# Patient Record
Sex: Female | Born: 1935 | Race: Black or African American | Hispanic: No | Smoking: Never smoker
Health system: Southern US, Community
[De-identification: ages and names within clinical notes are randomized; demographics above are authoritative.]

## PROBLEM LIST (undated history)

## (undated) DIAGNOSIS — K5790 Diverticulosis of intestine, part unspecified, without perforation or abscess without bleeding: Secondary | ICD-10-CM

## (undated) DIAGNOSIS — G4700 Insomnia, unspecified: Secondary | ICD-10-CM

## (undated) DIAGNOSIS — I509 Heart failure, unspecified: Secondary | ICD-10-CM

## (undated) DIAGNOSIS — K635 Polyp of colon: Secondary | ICD-10-CM

## (undated) DIAGNOSIS — I10 Essential (primary) hypertension: Secondary | ICD-10-CM

## (undated) DIAGNOSIS — D490 Neoplasm of unspecified behavior of digestive system: Secondary | ICD-10-CM

## (undated) DIAGNOSIS — I447 Left bundle-branch block, unspecified: Secondary | ICD-10-CM

## (undated) DIAGNOSIS — M1990 Unspecified osteoarthritis, unspecified site: Secondary | ICD-10-CM

## (undated) DIAGNOSIS — E785 Hyperlipidemia, unspecified: Secondary | ICD-10-CM

## (undated) DIAGNOSIS — K648 Other hemorrhoids: Secondary | ICD-10-CM

## (undated) HISTORY — DX: Heart failure, unspecified: I50.9

## (undated) HISTORY — DX: Essential (primary) hypertension: I10

## (undated) HISTORY — DX: Unspecified osteoarthritis, unspecified site: M19.90

## (undated) HISTORY — PX: COLON SURGERY: SHX602

## (undated) HISTORY — PX: ABDOMINAL HYSTERECTOMY: SHX81

## (undated) HISTORY — DX: Polyp of colon: K63.5

---

## 1997-07-04 ENCOUNTER — Ambulatory Visit (HOSPITAL_BASED_OUTPATIENT_CLINIC_OR_DEPARTMENT_OTHER): Admission: RE | Admit: 1997-07-04 | Discharge: 1997-07-04 | Payer: Self-pay | Admitting: Urology

## 1997-07-29 ENCOUNTER — Ambulatory Visit (HOSPITAL_COMMUNITY): Admission: RE | Admit: 1997-07-29 | Discharge: 1997-07-29 | Payer: Self-pay | Admitting: Urology

## 1997-12-15 ENCOUNTER — Ambulatory Visit (HOSPITAL_COMMUNITY): Admission: RE | Admit: 1997-12-15 | Discharge: 1997-12-15 | Payer: Self-pay | Admitting: Gastroenterology

## 1999-02-23 ENCOUNTER — Ambulatory Visit (HOSPITAL_COMMUNITY): Admission: RE | Admit: 1999-02-23 | Discharge: 1999-02-23 | Payer: Self-pay | Admitting: Cardiology

## 1999-02-23 ENCOUNTER — Encounter: Payer: Self-pay | Admitting: Cardiology

## 1999-07-02 ENCOUNTER — Ambulatory Visit (HOSPITAL_COMMUNITY): Admission: RE | Admit: 1999-07-02 | Discharge: 1999-07-02 | Payer: Self-pay | Admitting: Cardiology

## 1999-09-13 ENCOUNTER — Encounter: Admission: RE | Admit: 1999-09-13 | Discharge: 1999-09-13 | Payer: Self-pay | Admitting: Cardiology

## 1999-09-13 ENCOUNTER — Encounter: Payer: Self-pay | Admitting: Cardiology

## 1999-10-31 ENCOUNTER — Other Ambulatory Visit: Admission: RE | Admit: 1999-10-31 | Discharge: 1999-10-31 | Payer: Self-pay | Admitting: Obstetrics and Gynecology

## 2000-02-27 ENCOUNTER — Ambulatory Visit (HOSPITAL_COMMUNITY): Admission: RE | Admit: 2000-02-27 | Discharge: 2000-02-27 | Payer: Self-pay | Admitting: Cardiology

## 2000-02-27 ENCOUNTER — Encounter: Payer: Self-pay | Admitting: Cardiology

## 2000-03-23 ENCOUNTER — Emergency Department (HOSPITAL_COMMUNITY): Admission: EM | Admit: 2000-03-23 | Discharge: 2000-03-23 | Payer: Self-pay

## 2000-07-25 ENCOUNTER — Encounter: Admission: RE | Admit: 2000-07-25 | Discharge: 2000-07-25 | Payer: Self-pay | Admitting: Cardiology

## 2000-07-25 ENCOUNTER — Encounter: Payer: Self-pay | Admitting: Cardiology

## 2000-08-15 ENCOUNTER — Encounter: Admission: RE | Admit: 2000-08-15 | Discharge: 2000-11-13 | Payer: Self-pay | Admitting: Cardiology

## 2000-09-11 ENCOUNTER — Encounter: Admission: RE | Admit: 2000-09-11 | Discharge: 2000-09-11 | Payer: Self-pay | Admitting: Cardiology

## 2000-09-11 ENCOUNTER — Encounter: Payer: Self-pay | Admitting: Cardiology

## 2000-09-19 ENCOUNTER — Emergency Department (HOSPITAL_COMMUNITY): Admission: EM | Admit: 2000-09-19 | Discharge: 2000-09-19 | Payer: Self-pay | Admitting: Emergency Medicine

## 2001-01-02 ENCOUNTER — Other Ambulatory Visit: Admission: RE | Admit: 2001-01-02 | Discharge: 2001-01-02 | Payer: Self-pay | Admitting: Obstetrics and Gynecology

## 2001-02-12 ENCOUNTER — Encounter: Payer: Self-pay | Admitting: Cardiology

## 2001-02-12 ENCOUNTER — Encounter: Admission: RE | Admit: 2001-02-12 | Discharge: 2001-02-12 | Payer: Self-pay | Admitting: Cardiology

## 2001-04-16 ENCOUNTER — Ambulatory Visit (HOSPITAL_COMMUNITY): Admission: RE | Admit: 2001-04-16 | Discharge: 2001-04-16 | Payer: Self-pay | Admitting: Gastroenterology

## 2001-07-14 ENCOUNTER — Ambulatory Visit (HOSPITAL_COMMUNITY): Admission: RE | Admit: 2001-07-14 | Discharge: 2001-07-14 | Payer: Self-pay | Admitting: Cardiology

## 2001-10-28 ENCOUNTER — Other Ambulatory Visit: Admission: RE | Admit: 2001-10-28 | Discharge: 2001-10-28 | Payer: Self-pay | Admitting: Obstetrics and Gynecology

## 2002-04-07 ENCOUNTER — Encounter: Admission: RE | Admit: 2002-04-07 | Discharge: 2002-04-07 | Payer: Self-pay | Admitting: Cardiology

## 2002-04-07 ENCOUNTER — Encounter: Payer: Self-pay | Admitting: Cardiology

## 2002-06-25 ENCOUNTER — Encounter: Payer: Self-pay | Admitting: Cardiology

## 2002-06-25 ENCOUNTER — Encounter: Admission: RE | Admit: 2002-06-25 | Discharge: 2002-06-25 | Payer: Self-pay | Admitting: Cardiology

## 2002-12-17 ENCOUNTER — Ambulatory Visit (HOSPITAL_COMMUNITY): Admission: RE | Admit: 2002-12-17 | Discharge: 2002-12-17 | Payer: Self-pay | Admitting: Cardiology

## 2003-04-25 ENCOUNTER — Encounter: Admission: RE | Admit: 2003-04-25 | Discharge: 2003-04-25 | Payer: Self-pay | Admitting: Cardiology

## 2003-04-29 ENCOUNTER — Other Ambulatory Visit: Admission: RE | Admit: 2003-04-29 | Discharge: 2003-04-29 | Payer: Self-pay | Admitting: Obstetrics and Gynecology

## 2003-07-20 ENCOUNTER — Encounter (INDEPENDENT_AMBULATORY_CARE_PROVIDER_SITE_OTHER): Payer: Self-pay | Admitting: *Deleted

## 2003-07-20 ENCOUNTER — Ambulatory Visit (HOSPITAL_COMMUNITY): Admission: RE | Admit: 2003-07-20 | Discharge: 2003-07-20 | Payer: Self-pay | Admitting: Gastroenterology

## 2004-01-06 ENCOUNTER — Encounter: Admission: RE | Admit: 2004-01-06 | Discharge: 2004-01-06 | Payer: Self-pay | Admitting: Cardiology

## 2004-04-02 ENCOUNTER — Encounter: Admission: RE | Admit: 2004-04-02 | Discharge: 2004-04-02 | Payer: Self-pay | Admitting: Cardiology

## 2004-07-25 ENCOUNTER — Ambulatory Visit (HOSPITAL_COMMUNITY): Admission: RE | Admit: 2004-07-25 | Discharge: 2004-07-25 | Payer: Self-pay | Admitting: Cardiology

## 2004-08-06 ENCOUNTER — Encounter: Admission: RE | Admit: 2004-08-06 | Discharge: 2004-08-06 | Payer: Self-pay | Admitting: Obstetrics and Gynecology

## 2004-10-02 ENCOUNTER — Ambulatory Visit (HOSPITAL_COMMUNITY): Admission: RE | Admit: 2004-10-02 | Discharge: 2004-10-02 | Payer: Self-pay | Admitting: Cardiology

## 2004-10-29 ENCOUNTER — Encounter: Admission: RE | Admit: 2004-10-29 | Discharge: 2004-10-29 | Payer: Self-pay | Admitting: Cardiology

## 2004-11-06 ENCOUNTER — Encounter: Admission: RE | Admit: 2004-11-06 | Discharge: 2004-11-06 | Payer: Self-pay | Admitting: Cardiology

## 2004-11-09 ENCOUNTER — Ambulatory Visit (HOSPITAL_COMMUNITY): Admission: RE | Admit: 2004-11-09 | Discharge: 2004-11-09 | Payer: Self-pay | Admitting: Pulmonary Disease

## 2004-11-09 ENCOUNTER — Encounter (INDEPENDENT_AMBULATORY_CARE_PROVIDER_SITE_OTHER): Payer: Self-pay | Admitting: Specialist

## 2004-11-22 ENCOUNTER — Ambulatory Visit (HOSPITAL_COMMUNITY): Admission: RE | Admit: 2004-11-22 | Discharge: 2004-11-22 | Payer: Self-pay | Admitting: Cardiology

## 2005-07-03 ENCOUNTER — Ambulatory Visit (HOSPITAL_COMMUNITY): Admission: RE | Admit: 2005-07-03 | Discharge: 2005-07-03 | Payer: Self-pay | Admitting: Cardiology

## 2005-12-18 ENCOUNTER — Encounter: Admission: RE | Admit: 2005-12-18 | Discharge: 2005-12-18 | Payer: Self-pay | Admitting: Cardiology

## 2006-03-19 ENCOUNTER — Encounter (HOSPITAL_COMMUNITY): Admission: RE | Admit: 2006-03-19 | Discharge: 2006-03-19 | Payer: Self-pay | Admitting: Cardiology

## 2006-08-19 ENCOUNTER — Encounter: Admission: RE | Admit: 2006-08-19 | Discharge: 2006-08-19 | Payer: Self-pay | Admitting: Cardiology

## 2007-06-02 ENCOUNTER — Encounter: Admission: RE | Admit: 2007-06-02 | Discharge: 2007-06-02 | Payer: Self-pay | Admitting: Cardiology

## 2008-05-06 ENCOUNTER — Encounter: Admission: RE | Admit: 2008-05-06 | Discharge: 2008-05-06 | Payer: Self-pay | Admitting: Orthopedic Surgery

## 2008-12-14 ENCOUNTER — Encounter: Admission: RE | Admit: 2008-12-14 | Discharge: 2008-12-14 | Payer: Self-pay | Admitting: Cardiology

## 2008-12-26 ENCOUNTER — Encounter (HOSPITAL_COMMUNITY): Admission: RE | Admit: 2008-12-26 | Discharge: 2009-01-06 | Payer: Self-pay | Admitting: Family Medicine

## 2009-01-04 ENCOUNTER — Encounter: Admission: RE | Admit: 2009-01-04 | Discharge: 2009-01-04 | Payer: Self-pay | Admitting: Obstetrics and Gynecology

## 2009-07-24 ENCOUNTER — Encounter: Admission: RE | Admit: 2009-07-24 | Discharge: 2009-07-24 | Payer: Self-pay | Admitting: Cardiology

## 2009-10-25 ENCOUNTER — Encounter
Admission: RE | Admit: 2009-10-25 | Discharge: 2010-01-02 | Payer: Self-pay | Source: Home / Self Care | Attending: Physical Medicine & Rehabilitation | Admitting: Physical Medicine & Rehabilitation

## 2009-10-31 ENCOUNTER — Ambulatory Visit: Payer: Self-pay | Admitting: Physical Medicine & Rehabilitation

## 2010-01-08 ENCOUNTER — Encounter
Admission: RE | Admit: 2010-01-08 | Discharge: 2010-02-06 | Payer: Self-pay | Source: Home / Self Care | Attending: Physical Medicine & Rehabilitation | Admitting: Physical Medicine & Rehabilitation

## 2010-01-09 ENCOUNTER — Ambulatory Visit: Admit: 2010-01-09 | Payer: Self-pay | Admitting: Physical Medicine & Rehabilitation

## 2010-01-27 ENCOUNTER — Encounter: Payer: Self-pay | Admitting: Cardiology

## 2010-02-06 ENCOUNTER — Ambulatory Visit
Admission: RE | Admit: 2010-02-06 | Discharge: 2010-02-06 | Payer: Self-pay | Source: Home / Self Care | Attending: Physical Medicine & Rehabilitation | Admitting: Physical Medicine & Rehabilitation

## 2010-02-21 ENCOUNTER — Ambulatory Visit (HOSPITAL_COMMUNITY)
Admission: RE | Admit: 2010-02-21 | Discharge: 2010-02-21 | Disposition: A | Discharge status: Home / Self Care | Payer: Medicare Other | Source: Ambulatory Visit | Attending: Gastroenterology | Admitting: Gastroenterology

## 2010-02-21 ENCOUNTER — Other Ambulatory Visit: Payer: Self-pay | Admitting: Gastroenterology

## 2010-02-21 DIAGNOSIS — Z860100 Personal history of colon polyps, unspecified: Secondary | ICD-10-CM | POA: Insufficient documentation

## 2010-02-21 DIAGNOSIS — D371 Neoplasm of uncertain behavior of stomach: Secondary | ICD-10-CM | POA: Insufficient documentation

## 2010-02-21 DIAGNOSIS — K5730 Diverticulosis of large intestine without perforation or abscess without bleeding: Secondary | ICD-10-CM | POA: Insufficient documentation

## 2010-02-21 DIAGNOSIS — K639 Disease of intestine, unspecified: Secondary | ICD-10-CM | POA: Insufficient documentation

## 2010-02-21 DIAGNOSIS — Z800 Family history of malignant neoplasm of digestive organs: Secondary | ICD-10-CM | POA: Insufficient documentation

## 2010-02-21 DIAGNOSIS — K648 Other hemorrhoids: Secondary | ICD-10-CM | POA: Insufficient documentation

## 2010-03-01 ENCOUNTER — Other Ambulatory Visit (HOSPITAL_COMMUNITY): Payer: Self-pay | Admitting: Cardiology

## 2010-03-01 DIAGNOSIS — I509 Heart failure, unspecified: Secondary | ICD-10-CM

## 2010-03-21 ENCOUNTER — Ambulatory Visit (HOSPITAL_COMMUNITY)
Admission: RE | Admit: 2010-03-21 | Discharge: 2010-03-21 | Disposition: A | Discharge status: Home / Self Care | Payer: Medicare Other | Source: Ambulatory Visit | Attending: Cardiology | Admitting: Cardiology

## 2010-03-21 DIAGNOSIS — R079 Chest pain, unspecified: Secondary | ICD-10-CM | POA: Insufficient documentation

## 2010-03-21 DIAGNOSIS — I4949 Other premature depolarization: Secondary | ICD-10-CM | POA: Insufficient documentation

## 2010-03-21 DIAGNOSIS — I509 Heart failure, unspecified: Secondary | ICD-10-CM | POA: Insufficient documentation

## 2010-03-21 DIAGNOSIS — I498 Other specified cardiac arrhythmias: Secondary | ICD-10-CM | POA: Insufficient documentation

## 2010-03-21 MED ORDER — TECHNETIUM TC 99M TETROFOSMIN IV KIT
10.0000 | PACK | Freq: Once | INTRAVENOUS | Status: AC | PRN
  Administered 2010-03-21: 10 via INTRAVENOUS

## 2010-03-21 MED ORDER — TECHNETIUM TC 99M TETROFOSMIN IV KIT
30.0000 | PACK | Freq: Once | INTRAVENOUS | Status: AC | PRN
  Administered 2010-03-21: 30 via INTRAVENOUS

## 2010-05-01 ENCOUNTER — Ambulatory Visit: Payer: Self-pay | Admitting: Physical Medicine & Rehabilitation

## 2010-05-04 ENCOUNTER — Ambulatory Visit: Payer: Self-pay | Admitting: Physical Medicine & Rehabilitation

## 2010-05-04 ENCOUNTER — Encounter: Payer: Medicare Other | Attending: Neurosurgery | Admitting: Neurosurgery

## 2010-05-04 DIAGNOSIS — M549 Dorsalgia, unspecified: Secondary | ICD-10-CM | POA: Insufficient documentation

## 2010-05-04 DIAGNOSIS — M5430 Sciatica, unspecified side: Secondary | ICD-10-CM

## 2010-05-04 DIAGNOSIS — M4120 Other idiopathic scoliosis, site unspecified: Secondary | ICD-10-CM | POA: Insufficient documentation

## 2010-05-04 DIAGNOSIS — IMO0002 Reserved for concepts with insufficient information to code with codable children: Secondary | ICD-10-CM | POA: Insufficient documentation

## 2010-05-04 DIAGNOSIS — M4000 Postural kyphosis, site unspecified: Secondary | ICD-10-CM | POA: Insufficient documentation

## 2010-05-04 DIAGNOSIS — M069 Rheumatoid arthritis, unspecified: Secondary | ICD-10-CM | POA: Insufficient documentation

## 2010-05-04 DIAGNOSIS — M25569 Pain in unspecified knee: Secondary | ICD-10-CM | POA: Insufficient documentation

## 2010-05-05 NOTE — Assessment & Plan Note (Signed)
Mariah Aguilar is a patient who has been followed by Dr. Claudette Laws for sometime for back and knee pain.  This is an 77 year old female with kyphosis, degenerative disk disease, scoliosis as well as rheumatoid arthritis.  She is here with no new complaints.  She tells me that Dr. Donia Guiles, her primary care doctor is going out on back disability for a while to get treated and she will be changing primary care doctors who will hopefully fill Valium for her, she brought that prescription today from Dr. Shana Chute asking if we could write that and I have referred her back to her new primary care.  Again functionality, her average pain is about 6.  Her general activity level is about an 8.  Pain is worse at night.  Walking sometimes aggravates.  Heat and ice and medications tend to help.  She walks without assistance.  She drives.  She is able to climb steps.  She is not employed.  REVIEW OF SYSTEMS:  Notable for those difficulties as described in the history of present illness and past medical history.  Review of systems is otherwise unremarkable.  PAST MEDICAL HISTORY:  Significant for GI problems, blood pressure, and arthritis.  SOCIAL HISTORY:  She is widowed, lives alone.  FAMILY HISTORY:  Unchanged.  PHYSICAL EXAMINATION:  She is point tender over her mid and low back. She does have a brace on.  Her blood pressure is 131/76, pulse 85, respirations 18, and O2 sats 97 on room air.  Her sensation is positive and equal in lower extremities.  Motor strength appears to be 5/5 in the lower extremities, tested.  She gets weakness of her leg due to age and her lower extremities have no edema today.  Constitutionally, she is within normal limits.  She is alert and oriented x3.  No other problems noted.  Questions encouraged and answered.  She is okay on her medications and no prescriptions were issued.  Her counts were correct and there were no signs of aberrant behavior.  We  will see her back in a month.     Shirell Struthers L. Blima Dessert    RLW/MedQ D:  05/04/2010 14:16:50  T:  05/05/2010 03:35:07  Job #:  [id number]

## 2010-05-14 ENCOUNTER — Ambulatory Visit (HOSPITAL_COMMUNITY)
Admission: RE | Admit: 2010-05-14 | Discharge: 2010-05-14 | Disposition: A | Discharge status: Home / Self Care | Payer: Medicare Other | Source: Ambulatory Visit | Attending: General Surgery | Admitting: General Surgery

## 2010-05-14 ENCOUNTER — Encounter (HOSPITAL_COMMUNITY)
Admission: RE | Admit: 2010-05-14 | Discharge: 2010-05-14 | Disposition: A | Discharge status: Home / Self Care | Payer: Medicare Other | Source: Ambulatory Visit | Attending: General Surgery | Admitting: General Surgery

## 2010-05-14 ENCOUNTER — Other Ambulatory Visit (HOSPITAL_COMMUNITY): Payer: Self-pay | Admitting: General Surgery

## 2010-05-14 DIAGNOSIS — J4489 Other specified chronic obstructive pulmonary disease: Secondary | ICD-10-CM | POA: Insufficient documentation

## 2010-05-14 DIAGNOSIS — J449 Chronic obstructive pulmonary disease, unspecified: Secondary | ICD-10-CM | POA: Insufficient documentation

## 2010-05-14 DIAGNOSIS — Z01818 Encounter for other preprocedural examination: Secondary | ICD-10-CM | POA: Insufficient documentation

## 2010-05-14 DIAGNOSIS — K635 Polyp of colon: Secondary | ICD-10-CM

## 2010-05-14 DIAGNOSIS — I517 Cardiomegaly: Secondary | ICD-10-CM | POA: Insufficient documentation

## 2010-05-14 DIAGNOSIS — Z01810 Encounter for preprocedural cardiovascular examination: Secondary | ICD-10-CM | POA: Insufficient documentation

## 2010-05-14 DIAGNOSIS — Z01812 Encounter for preprocedural laboratory examination: Secondary | ICD-10-CM | POA: Insufficient documentation

## 2010-05-14 LAB — DIFFERENTIAL
Basophils Relative: 1 % (ref 0–1)
Eosinophils Absolute: 0.3 10*3/uL (ref 0.0–0.7)
Lymphs Abs: 2.3 10*3/uL (ref 0.7–4.0)
Monocytes Absolute: 0.4 10*3/uL (ref 0.1–1.0)
Monocytes Relative: 7 % (ref 3–12)
Neutrophils Relative %: 48 % (ref 43–77)

## 2010-05-14 LAB — BASIC METABOLIC PANEL WITH GFR
BUN: 13 mg/dL (ref 6–23)
CO2: 35 meq/L — ABNORMAL HIGH (ref 19–32)
Calcium: 9.2 mg/dL (ref 8.4–10.5)
Chloride: 99 meq/L (ref 96–112)
Creatinine, Ser: 0.7 mg/dL (ref 0.4–1.2)
GFR calc non Af Amer: >60 mL/min
Glucose, Bld: 127 mg/dL — ABNORMAL HIGH (ref 70–99)
Potassium: 4 meq/L (ref 3.5–5.1)
Sodium: 140 meq/L (ref 135–145)

## 2010-05-14 LAB — CBC
Hemoglobin: 13.6 g/dL (ref 12.0–15.0)
MCH: 28.5 pg (ref 26.0–34.0)
MCHC: 33.3 g/dL (ref 30.0–36.0)
MCV: 85.5 fL (ref 78.0–100.0)
Platelets: 251 10*3/uL (ref 150–400)
RBC: 4.77 MIL/uL (ref 3.87–5.11)

## 2010-05-14 LAB — SURGICAL PCR SCREEN
MRSA, PCR: NEGATIVE
Staphylococcus aureus: NEGATIVE

## 2010-05-21 ENCOUNTER — Other Ambulatory Visit: Payer: Self-pay | Admitting: General Surgery

## 2010-05-21 ENCOUNTER — Inpatient Hospital Stay (HOSPITAL_COMMUNITY)
Admission: RE | Admit: 2010-05-21 | Discharge: 2010-05-25 | DRG: 330 | Disposition: A | Discharge status: Home / Self Care | Payer: Medicare Other | Source: Ambulatory Visit | Attending: General Surgery | Admitting: General Surgery

## 2010-05-21 DIAGNOSIS — I43 Cardiomyopathy in diseases classified elsewhere: Secondary | ICD-10-CM | POA: Diagnosis present

## 2010-05-21 DIAGNOSIS — Z7982 Long term (current) use of aspirin: Secondary | ICD-10-CM

## 2010-05-21 DIAGNOSIS — Z79899 Other long term (current) drug therapy: Secondary | ICD-10-CM

## 2010-05-21 DIAGNOSIS — B9789 Other viral agents as the cause of diseases classified elsewhere: Secondary | ICD-10-CM | POA: Diagnosis present

## 2010-05-21 DIAGNOSIS — I509 Heart failure, unspecified: Secondary | ICD-10-CM | POA: Diagnosis present

## 2010-05-21 DIAGNOSIS — E876 Hypokalemia: Secondary | ICD-10-CM | POA: Diagnosis not present

## 2010-05-21 DIAGNOSIS — D126 Benign neoplasm of colon, unspecified: Principal | ICD-10-CM | POA: Diagnosis present

## 2010-05-21 LAB — TYPE AND SCREEN: Antibody Screen: NEGATIVE

## 2010-05-21 LAB — ABO/RH: ABO/RH(D): O POS

## 2010-05-22 LAB — BASIC METABOLIC PANEL
CO2: 29 mEq/L (ref 19–32)
Calcium: 7.7 mg/dL — ABNORMAL LOW (ref 8.4–10.5)
Chloride: 100 mEq/L (ref 96–112)
GFR calc Af Amer: >60 mL/min (ref >60)
Potassium: 3.3 mEq/L — ABNORMAL LOW (ref 3.5–5.1)
Sodium: 136 mEq/L (ref 135–145)

## 2010-05-22 LAB — CBC
HCT: 35 % — ABNORMAL LOW (ref 36.0–46.0)
Hemoglobin: 11.6 g/dL — ABNORMAL LOW (ref 12.0–15.0)
MCHC: 33.1 g/dL (ref 30.0–36.0)
RBC: 4.17 MIL/uL (ref 3.87–5.11)
WBC: 10.2 10*3/uL (ref 4.0–10.5)

## 2010-05-22 LAB — DIFFERENTIAL
Basophils Absolute: 0 10*3/uL (ref 0.0–0.1)
Basophils Relative: 0 % (ref 0–1)
Lymphocytes Relative: 19 % (ref 12–46)
Neutro Abs: 6.8 10*3/uL (ref 1.7–7.7)
Neutrophils Relative %: 67 % (ref 43–77)

## 2010-05-23 LAB — BASIC METABOLIC PANEL WITH GFR
BUN: 8 mg/dL (ref 6–23)
CO2: 31 meq/L (ref 19–32)
Calcium: 8.4 mg/dL (ref 8.4–10.5)
Chloride: 100 meq/L (ref 96–112)
Creatinine, Ser: 0.62 mg/dL (ref 0.4–1.2)
GFR calc non Af Amer: >60 mL/min
Glucose, Bld: 146 mg/dL — ABNORMAL HIGH (ref 70–99)
Potassium: 3.6 meq/L (ref 3.5–5.1)
Sodium: 137 meq/L (ref 135–145)

## 2010-05-25 NOTE — Op Note (Signed)
NAME:  Mariah Aguilar, Mariah Aguilar            ACCOUNT NO.:  [id number]   MEDICAL RECORD NO.:  [id number]          PATIENT TYPE:  AMB   LOCATION:  ENDO                         FACILITY:  [facility]   PHYSICIAN:  Anselmo Rod, M.D.  DATE OF BIRTH:  Jun 29, 1922   DATE OF PROCEDURE:  11/09/2004  DATE OF DISCHARGE:                                 OPERATIVE REPORT   PROCEDURE PERFORMED:  Colonoscopy with cold biopsies times two.   ENDOSCOPIST:  Charna Elizabeth, M.D.   INSTRUMENT USED:  Olympus video colonoscope.   INDICATIONS FOR PROCEDURE:  The patient is an 77 year old African-American  female with a family history of colon cancer in her mother and a personal  history of adenomatous polyps undergoing a repeat colonoscopy to rule out  recurrent polyps, masses, etc.   PREPROCEDURE PREPARATION:  Informed consent was procured from the patient.  The patient was fasted for four hours prior to the procedure and prepped  OsmoPrep pills the night prior to and the morning of the procedure.  The  risks and benefits of the procedure were discussed with the patient in great  detail. A 10% miss rate for polyps or cancers was discussed with the patient  as well.   PREPROCEDURE PHYSICAL:  The patient had stable vital signs.  Neck supple.  Chest clear to auscultation.  S1 and S2 regular.  Abdomen soft with normal  bowel sounds.   DESCRIPTION OF PROCEDURE:  The patient was placed in left lateral decubitus  position and sedated with 70 mg of Demerol and 7.5 mg of Versed in slow  incremental doses.  Once the patient was adequately sedated and maintained  on low flow oxygen and continuous cardiac monitoring, the Olympus video  colonoscope was advanced from the rectum to the cecum with slight difficulty  because of the extensive diverticulosis in the sigmoid colon.  The scope was  gently advanced from the rectosigmoid colon to the transverse colon.  The  appendicular orifice and ileocecal valve were clearly visualized  and  photographed. There was some residual stool in the colon and multiple washes  were done. There was extensive sigmoid diverticuloses noted, stool in  several of the diverticula.  Small internal hemorrhoids were seen on  retroflexion.  A small sessile polyp was biopsied (cold biopsied times one)  from the cecal base.  Another small sessile polyp was biopsied from the  distal right colon (cold biopsy times one).  No large masses or polyps were  seen.  The patient tolerated the procedure well without immediate  complication.   IMPRESSION:  1.  Small nonbleeding internal hemorrhoids.  2.  Extensive sigmoid diverticulosis.  3.  Two small sessile polyps biopsied (cold biopsies) from cecum and distal      right colon.  4.  External compression of the rectum from a pessary that the patient is      known to have.   RECOMMENDATIONS:  1.  Await pathology results.  2.  Repeat colonoscopy depending on pathology results.  3.  Continue high fiber diet with liberal fluid intake.  4.  Avoid all nonsteroidals including aspirin for the  next two weeks.  5.  Outpatient followup as need arises in the future.      Anselmo Rod, M.D.  Electronically Signed     JNM/MEDQ  D:  11/09/2004  T:  11/09/2004  Job:  [id number]   cc:   Osvaldo Shipper. Spruill, M.D.  Fax: [phone]   Maxie Better, M.D.  Fax: [phone]

## 2010-05-25 NOTE — Op Note (Signed)
NAME:  Mariah Aguilar, Mariah Aguilar                      ACCOUNT NO.:  [id number]   MEDICAL RECORD NO.:  [id number]                   PATIENT TYPE:  AMB   LOCATION:  ENDO                                 FACILITY:  [facility]   PHYSICIAN:  Anselmo Rod, M.D.               DATE OF BIRTH:  15-Dec-1922   DATE OF PROCEDURE:  07/20/2003  DATE OF DISCHARGE:                                 OPERATIVE REPORT   PROCEDURE PERFORMED:  Colonoscopy with biopsies times one.   ENDOSCOPIST:  Charna Elizabeth, M.D.   INSTRUMENT USED:  Olympus video colonoscope.   INDICATIONS FOR PROCEDURE:  The patient is a 77 year old African-American  female with a personal history of colonic polyps and a family history of  colon cancer undergoing repeat colonoscopy.  Rule out colonic polyps,  masses, etc.   PREPROCEDURE PREPARATION:  Informed consent was procured from the patient.  The patient was fasted for eight hours prior to the procedure and prepped  with a bottle of magnesium citrate and a gallon of GoLYTELY the night prior  to the procedure.   PREPROCEDURE PHYSICAL:  The patient had stable vital signs.  Neck supple.  Chest clear to auscultation.  S1 and S2 regular.  Abdomen soft with normal  bowel sounds.   DESCRIPTION OF PROCEDURE:  The patient was placed in left lateral decubitus  position and sedated with 80 mg of Demerol and 10 mg of Versed in slow  incremental doses.  Once the patient was adequately sedated and maintained  on low flow oxygen and continuous cardiac monitoring, the Olympus video  colonoscope was advanced from the rectum to the cecum with extreme  difficulty.  There was a large amount of solid stool throughout the colon,  multiple washes were done.  The appendicular orifice and ileocecal valve  were visualized and photographed; however, visualization was limited.  A  small sessile polyp was biopsied from the proximal right colon.  Retroflexion in the rectum revealed no abnormalities.  Large  diverticula  were seen throughout the colon with inspissated stool in several of the  diverticula.  Small lesions could be missed.   IMPRESSION:  1. Small sessile polyp biopsied from proximal right colon.  2. Pandiverticulosis with large amount of residual stool in the colon.     Inadequate visualization.   RECOMMENDATIONS:  1. Await pathology results.  2. Repeat colorectal cancer screening depending on pathology results.  3. Outpatient followup in the next two weeks for further recommendations.                                               Anselmo Rod, M.D.    JNM/MEDQ  D:  07/20/2003  T:  07/20/2003  Job:  [id number]   cc:   Maxie Better, M.D.  [address]  [place]  [place] [address]  Fax: [phone]   Osvaldo Shipper. Spruill, M.D.  [address] [address]  [place]  [place] [address]  Fax: [phone]

## 2010-05-25 NOTE — Procedures (Signed)
[facility]  Patient:    Mariah Aguilar, Mariah Aguilar Visit Number: [id number] MRN: [id number]          Service Type: END Location: ENDO Attending Physician:  Charna Elizabeth Dictated by:   Anselmo Rod, M.D. Proc. Date: 04/16/01 Admit Date:  04/16/2001   CC:         Osvaldo Shipper. Spruill, M.D.   Procedure Report  DATE OF BIRTH:  1922/01/17  REFERRING PHYSICIAN:  Osvaldo Shipper. Spruill, M.D.  PROCEDURE PERFORMED:  Colonoscopy.  ENDOSCOPIST:  Anselmo Rod, M.D.  INSTRUMENT USED:  Olympus video pediatric colonoscope.  INDICATIONS FOR PROCEDURE:  The patient is a 77 year old African-American female with a history of colon cancer in her mother and a personal history of adenomatous polyps removed in 1999, rule out recurrent polyps.  PREPROCEDURE PREPARATION:  Informed consent was procured from the patient. The patient was fasted for eight hours prior to the procedure and prepped with a bottle of magnesium citrate and a gallon of NuLytely the night prior to the procedure.  PREPROCEDURE PHYSICAL:  The patient had stable vital signs.  Neck supple. Chest clear to auscultation.  S1, S2 regular.  Abdomen soft with normal bowel sounds.  DESCRIPTION OF PROCEDURE:  The patient was placed in the left lateral decubitus position and sedated with 100 mg of Demerol and 7.5 mg of Versed intravenously.  Once the patient was adequately sedated and maintained on low-flow oxygen and continuous cardiac monitoring, the Olympus video colonoscope was advanced from the rectum to the cecum with difficulty.  There was a large amount of solid residual stool in the colon.  Multiple washes were done.  The patients position was changed from the left lateral to the supine and to the right lateral position to adequately visualize the cecal base.  The appendicular orifice and ileocecal valve were clearly visualized and photographed.  No masses, polyps, erosions or ulcerations were  seen.  There was no evidence of hemorrhoids.  The patient had scattered pandiverticular disease.  IMPRESSION: 1. Scattered pandiverticulosis. 2. No masses or polyps seen. 3. Large amount of residual stool in the colon.  Small lesions could have been    missed.  RECOMMENDATIONS: 1. A high fiber diet has been emphasized to the patient.  Brochures have    been given to her for education. 2. Repeat colorectal cancer screening is recommended in the next three years    unless the patient were to develop any abnormal symptoms in the interim.    The colonoscopy is being scheduled at an earlier date at three years    instead of five years because of an inadequate prep and incomplete    visualization of the colon at this time. 3. If the patient develops any abnormal GI symptoms, she is to come back    to the office immediately. Dictated by:   Anselmo Rod, M.D. Attending Physician:  Charna Elizabeth DD:  04/16/01 TD:  04/16/01 Job: [id number]

## 2010-06-01 ENCOUNTER — Encounter: Payer: Medicare Other | Attending: Neurosurgery | Admitting: Neurosurgery

## 2010-06-01 DIAGNOSIS — G894 Chronic pain syndrome: Secondary | ICD-10-CM

## 2010-06-01 DIAGNOSIS — Z9889 Other specified postprocedural states: Secondary | ICD-10-CM | POA: Insufficient documentation

## 2010-06-01 DIAGNOSIS — M4120 Other idiopathic scoliosis, site unspecified: Secondary | ICD-10-CM | POA: Insufficient documentation

## 2010-06-01 DIAGNOSIS — G8929 Other chronic pain: Secondary | ICD-10-CM | POA: Insufficient documentation

## 2010-06-01 DIAGNOSIS — M1710 Unilateral primary osteoarthritis, unspecified knee: Secondary | ICD-10-CM | POA: Insufficient documentation

## 2010-06-01 DIAGNOSIS — M545 Low back pain, unspecified: Secondary | ICD-10-CM | POA: Insufficient documentation

## 2010-06-01 DIAGNOSIS — M5430 Sciatica, unspecified side: Secondary | ICD-10-CM

## 2010-06-02 NOTE — Assessment & Plan Note (Signed)
ACCOUNT:  [id number].  Ms. Birchler followed up here for chronic back pain with Dr. Wynn Banker. She reports no acute changes in her pain.  She rates her pain at about an 8 or 9 on average, stabbing type pain.  Only she does walk independently with somewhat of a kyphotic posture.  However, she has just had colorectal surgery and is recovering from that.  She rates her pain to be worse in the evening and night.  Most activities aggravate her pain.  Medication helps some.  She walks without assistance for the most part.  She does try to exercise every day and walk at least twice a day.  She is retired.  REVIEW OF SYSTEMS:  Notable for those difficulties as described above as well as some constipation, abdominal pain, and healing from her surgery.  PAST MEDICAL HISTORY:  Otherwise unchanged.  SOCIAL HISTORY:  She is widowed.  FAMILY HISTORY:  Significant for heart disease and hypertension.  PHYSICAL EXAM:  Blood pressure 163/84, pulse 71, respirations 20, O2 sats 98 on room air.  Motor strength is 5/5 in the lower extremities as tested, it begins resistance.  Her sensation is positive and equal in the lower extremities.  Her gait is somewhat altered by a slight kyphotic posture, but otherwise normal.  Constitutionally, she is within normal limits.  Orientation, she is alert and orient x3.  Her affects is bright.  ASSESSMENT: 1. The patient with a history of low back pain. 2. She has had a history of scoliosis. 3. Bilateral knee osteoarthritis. 4. Recent colorectal surgery.  PLAN:  She will continue with her medicines as she has them.  She uses Voltaren gel occasionally.  She does not take her Neurontin every day, but she does sometimes due to the feeling it gives her.  She has hydrocodone 7.5/325 one p.o. up to four times a day if she needs it. She denies the need for refills today.  Her questions were encouraged and answered.  We will see her back in 3 months.     Nellene Courtois L.  Blima Dessert Electronically Signed    RLW/MedQ D:  06/01/2010 12:37:13  T:  06/02/2010 02:24:49  Job #:  [id number]

## 2010-06-06 NOTE — Op Note (Signed)
Mariah Aguilar, Mariah Aguilar            ACCOUNT NO.:  [id number]  MEDICAL RECORD NO.:  [id number]           PATIENT TYPE:  I  LOCATION:  5128                         FACILITY:  [facility]  PHYSICIAN:  Cherylynn Ridges, M.D.    DATE OF BIRTH:  11/03/1922  DATE OF PROCEDURE:  05/21/2010 DATE OF DISCHARGE:                              OPERATIVE REPORT   PREOPERATIVE DIAGNOSIS:  Sessile polyp of the cecum.  POSTOPERATIVE DIAGNOSIS:  Sessile polyp of the cecum.  PROCEDURE:  Right partial colectomy.  SURGEON:  Marta Lamas. Lindie Spruce, MD  ASSISTANT:  Anselm Pancoast. Weatherly, MD  ANESTHESIA:  General endotracheal.  ESTIMATED BLOOD LOSS:  Less than 50 mL.  No complications.  CONDITION:  Stable.  FINDINGS:  Sessile polyp of the cecum.  INDICATIONS FOR OPERATION:  The patient is an 77 year old with a sessile polyp of her right colon who comes in now for elective right colectomy.  OPERATION:  The patient was taken to the operating room, placed on the table in supine position.  After an adequate general endotracheal anesthetic was administered, she was prepped and draped in usual sterile manner exposing her entire abdomen.  After proper time-out was performed, identifying the patient, the side of the procedure, and the procedure to be performed, we made a right transverse incision at the level of the umbilicus down into the subcutaneous tissue.  We used electrocautery to go through the subcu, the anterior rectus sheath,  the rectus muscle, and then the posterior rectus sheath while we tented up on it to prevent any bowel injury.  We subsequently opened the posterior fascia transversely for the full extent of the skin incision.  Using primarily a Richardson retractor in place, we were able to mobilize the right colon and terminal ileum.  We could palpate what felt to be a polyp in the cecum.  We came across the distal terminal ileum using a GIA-75 stapler.  Once we did so, we mobilized the right colon  up to just proximal of the hepatic flexure and came across the right colon using a GIA-75 stapler.  We then did a side- to-side functional end-to-end anastomosis between the small bowel and the distal right colon using a GIA-75 stapler.  The resulting enterotomy was closed using a TA-60 stapler.  The intervening mesentery between the resected specimen ends was taken EnSeal super jaw device.  The ileocolic vessel was ligated with double ligature of 2-0 silk.  Once the specimen was removed, the anastomosis was completed, the mesentery was reapproximated using interrupted 2-0 silk sutures.  We changed our gloves twice during the removal of the specimen and then subsequently just after the anastomosis prior to irrigation.  We irrigated with about 500 mL of saline, then we closed in two layers of the fascia.  The posterior and anterior rectus sheath were closed using running looped 0 PDS suture.  The skin was closed using running subcuticular stitch of 3- 0 Monocryl.  Dermabond, Steri-Strips, Tegaderm were used to complete the dressing.  All counts were correct.  We did inject 10 mL of 0.5% Marcaine with epi into the wound.  All counts were correct.  Cherylynn Ridges, M.D.     JOW/MEDQ  D:  05/21/2010  T:  05/21/2010  Job:  [id number]  cc:   Merlene Laughter. Renae Gloss, M.D. Anselmo Rod, MD, [facility]  Electronically Signed by Jimmye Norman M.D. on 06/06/2010 05:10:38 PM

## 2010-06-06 NOTE — Discharge Summary (Signed)
  [name], Mariah            ACCOUNT NO.:  [id number]  MEDICAL RECORD NO.:  [id number]           PATIENT TYPE:  I  LOCATION:  5128                         FACILITY:  [facility]  PHYSICIAN:  Cherylynn Ridges, M.D.    DATE OF BIRTH:  1922/01/11  DATE OF ADMISSION:  05/21/2010 DATE OF DISCHARGE:  05/25/2010                              DISCHARGE SUMMARY   DISCHARGE DIAGNOSIS:  Tubular adenoma of the cecum and right colon.  PRINCIPAL PROCEDURE:  Right colectomy.  ADDITIONAL DIAGNOSIS:  History of congestive heart failure.  DISCHARGE MEDICATIONS:  She will be given Percocet to take at home for pain.  DIET ON DISCHARGE:  Regular.  CONDITION:  Stable.  BRIEF SUMMARY OF HOSPITAL COURSE:  The patient was admitted on the day of surgery after bowel prep for a sessile polyp of the cecum. Subsequent pathology of the resected terminal ileum and right colon demonstrates a tubular adenoma.  There is no evidence of malignancy, no high-grade dysplasia.  She was started on clear liquids on postop day #1 and #2, advanced to fluid liquids on day #3 and a full soft diet on the evening of postop day #3 and #4.  She has had several bowel movements, passing gas, her wound looks great with no evidence of infection.  She has no abdominal pain.  She is exercising in the hallway, walking very well.  She is using incentive spirometer up to 1502 liters.  She is return to clinic a week from Tuesday which will be Jun 05, 2010.     Cherylynn Ridges, M.D.     JOW/MEDQ  D:  05/25/2010  T:  05/25/2010  Job:  [id number]  Electronically Signed by Jimmye Norman M.D. on 06/06/2010 05:10:43 PM

## 2010-06-07 ENCOUNTER — Encounter (INDEPENDENT_AMBULATORY_CARE_PROVIDER_SITE_OTHER): Payer: Self-pay | Admitting: General Surgery

## 2010-07-18 ENCOUNTER — Encounter: Payer: Medicare Other | Attending: Neurosurgery | Admitting: Neurosurgery

## 2010-07-18 DIAGNOSIS — M4000 Postural kyphosis, site unspecified: Secondary | ICD-10-CM | POA: Insufficient documentation

## 2010-07-18 DIAGNOSIS — M4120 Other idiopathic scoliosis, site unspecified: Secondary | ICD-10-CM | POA: Insufficient documentation

## 2010-07-18 DIAGNOSIS — IMO0002 Reserved for concepts with insufficient information to code with codable children: Secondary | ICD-10-CM | POA: Insufficient documentation

## 2010-07-18 DIAGNOSIS — M069 Rheumatoid arthritis, unspecified: Secondary | ICD-10-CM | POA: Insufficient documentation

## 2010-07-18 DIAGNOSIS — M549 Dorsalgia, unspecified: Secondary | ICD-10-CM | POA: Insufficient documentation

## 2010-07-18 DIAGNOSIS — M25569 Pain in unspecified knee: Secondary | ICD-10-CM | POA: Insufficient documentation

## 2010-07-18 DIAGNOSIS — M545 Low back pain: Secondary | ICD-10-CM

## 2010-07-18 DIAGNOSIS — M1710 Unilateral primary osteoarthritis, unspecified knee: Secondary | ICD-10-CM

## 2010-07-19 NOTE — Assessment & Plan Note (Signed)
HISTORY OF PRESENT ILLNESS:  [name] is a patient of Dr. Wynn Banker who is followed for back and right knee pain.  She comes in today for pill count stating that her pain has not changed.  Pill count appears to be correct.  Her average pain is an 8.  Her sleep patterns are fair. Pain is worse at night.  She walks without assistance.  She can drive and climb steps.  She can walk about 20 minutes at a time.  She is not employed.  REVIEW OF SYSTEMS:  Notable for those difficulties described above as well as sleep apnea, otherwise her past medical history is unchanged. She just had a colorectal surgery she is recovering from.  SOCIAL HISTORY:  She lives alone.  FAMILY HISTORY:  Unchanged.  PHYSICAL EXAMINATION:  VITAL SIGNS:  Blood pressure 174/95, pulse 72, respirations 20, and O2 sats 93 on room air. NEUROLOGIC:  Her motor strength is good in her lower extremities, even though she is painful due to her surgery.  Her gait is normal.  She is alert and oriented x3.  ASSESSMENT: 1. Chronic low back pain. 2. Right knee osteoarthritis. 3. Colorectal surgery, recent.  PLAN:  She just had her hydrocodone refilled.  She is okay with her pin sites.  She has no prescriptions today.  She will follow up in the office in 3 months as long as her pill count is correct.  Her hydrocodone can be refilled.  Her Oswestry score is 30.  No signs of aberrant behavior.     Walker Paddack L. Blima Dessert Electronically Signed    RLW/MedQ D:  07/18/2010 12:53:28  T:  07/19/2010 00:01:07  Job #:  [id number]

## 2010-07-24 ENCOUNTER — Ambulatory Visit (INDEPENDENT_AMBULATORY_CARE_PROVIDER_SITE_OTHER): Payer: Medicare Other | Admitting: General Surgery

## 2010-07-24 ENCOUNTER — Encounter (INDEPENDENT_AMBULATORY_CARE_PROVIDER_SITE_OTHER): Payer: Self-pay | Admitting: General Surgery

## 2010-07-24 DIAGNOSIS — Z09 Encounter for follow-up examination after completed treatment for conditions other than malignant neoplasm: Secondary | ICD-10-CM

## 2010-07-24 NOTE — Progress Notes (Signed)
HPI Right colectomy to right transverse incision.  PE Wounds healed well with no evidence of infection no evidence of hernia. She has a mild abdominal bloating.  Studiy review No studies to review today  Assessment Doing well status post right colectomy  Plan See the patient on a p.r.n. basis.

## 2010-08-31 ENCOUNTER — Ambulatory Visit: Payer: Medicare Other | Admitting: Physical Medicine & Rehabilitation

## 2010-09-14 ENCOUNTER — Telehealth (INDEPENDENT_AMBULATORY_CARE_PROVIDER_SITE_OTHER): Payer: Self-pay

## 2010-09-14 NOTE — Telephone Encounter (Addendum)
C/o  Bloating- has been going on for sometime now- Patient getting ready to take a [place] trip and want something for comfort- After checking chart she is due for one month follow up. She having regular bm's and taking miralax.  This message will be forwarded to [facility] for follow up appointment and Dr. Lindie Spruce for review. Also given to urgent office. RMP

## 2010-09-14 NOTE — Telephone Encounter (Signed)
C/o bloating, regular bm's

## 2010-10-15 ENCOUNTER — Encounter: Payer: Medicare Other | Attending: Neurosurgery

## 2010-10-15 ENCOUNTER — Ambulatory Visit: Payer: Medicare Other | Admitting: Physical Medicine & Rehabilitation

## 2010-10-15 DIAGNOSIS — M4120 Other idiopathic scoliosis, site unspecified: Secondary | ICD-10-CM | POA: Insufficient documentation

## 2010-10-15 DIAGNOSIS — M4200 Juvenile osteochondrosis of spine, site unspecified: Secondary | ICD-10-CM

## 2010-10-15 DIAGNOSIS — M549 Dorsalgia, unspecified: Secondary | ICD-10-CM | POA: Insufficient documentation

## 2010-10-15 DIAGNOSIS — IMO0002 Reserved for concepts with insufficient information to code with codable children: Secondary | ICD-10-CM | POA: Insufficient documentation

## 2010-10-15 DIAGNOSIS — M1710 Unilateral primary osteoarthritis, unspecified knee: Secondary | ICD-10-CM

## 2010-10-15 DIAGNOSIS — M069 Rheumatoid arthritis, unspecified: Secondary | ICD-10-CM | POA: Insufficient documentation

## 2010-10-15 DIAGNOSIS — M25569 Pain in unspecified knee: Secondary | ICD-10-CM | POA: Insufficient documentation

## 2010-10-15 DIAGNOSIS — M4000 Postural kyphosis, site unspecified: Secondary | ICD-10-CM | POA: Insufficient documentation

## 2010-10-15 NOTE — Assessment & Plan Note (Signed)
REASON FOR VISIT:  Right knee pain as well as back pain.  HISTORY:  An 77 year old female with history of severe scoliosis in thoracolumbar spine, she has chronic pain which is relieved by narcotic analgesics.  These allowed her to remain functionally independent.  She also had some right knee pain, wears a knee sleeve.  We had around Voltaren gel, this was discontinued, we called in Pennsaid and then she was learned by the written material accompanying the package insert. She did not use it.  We discussed that essentially with the same medicine at the Voltaren gel and risks for side effects is minimal.  INTERVAL MEDICAL HISTORY:  Abdominal, tumor removal.  PHYSICAL EXAMINATION:  GENERAL:  No acute distress.  Mood and affect appropriate. EXTREMITIES:  She has normal strength in bilateral lower extremities. She has mild decreased range of motion in the right knee flexors.  She has no tenderness to palpation around the joint line and around the patella.  No evidence of knee effusion bilaterally.  No erythema. BACK:  Her back has S-shaped curve.  Health and history form reviewed.  Pain score 8/10.  FUNCTIONAL STATUS:  Thirty minutes walking tolerance, able to climb step, able to drive.  IMPRESSION: 1. Thoracolumbar scoliosis, chronic pain.. 2. Right knee osteoarthritis, resume Voltaren gel.  PLAN: 1. We will continue hydrocodone and urine drug screen. 2. Did not bring pill bottles, we need to bring them in for total pill     count in part of new prescription, just filled x2.     Erick Colace, M.D. Electronically Signed    AEK/MedQ D:  10/15/2010 11:18:02  T:  10/15/2010 16:23:42  Job #:  [id number]

## 2010-11-05 ENCOUNTER — Encounter: Payer: Medicare Other | Attending: Neurosurgery | Admitting: Neurosurgery

## 2010-11-05 DIAGNOSIS — M545 Low back pain, unspecified: Secondary | ICD-10-CM | POA: Insufficient documentation

## 2010-11-05 DIAGNOSIS — M1710 Unilateral primary osteoarthritis, unspecified knee: Secondary | ICD-10-CM

## 2010-11-05 DIAGNOSIS — M4120 Other idiopathic scoliosis, site unspecified: Secondary | ICD-10-CM

## 2010-11-05 DIAGNOSIS — G8929 Other chronic pain: Secondary | ICD-10-CM | POA: Insufficient documentation

## 2010-11-05 DIAGNOSIS — M4130 Thoracogenic scoliosis, site unspecified: Secondary | ICD-10-CM | POA: Insufficient documentation

## 2010-11-06 NOTE — Assessment & Plan Note (Signed)
This is a patient of Dr. Wynn Banker, seen for thoracolumbar scoliosis, low back pain.  She does get relief with her medicines for pain that averages 6-8, tingling and aching pain.  General activity level is 7. Pain is worse at night.  Sleep patterns are poor.  Walking, bending, sitting, standing tend to aggravate therapy; heat medication tend to help.  She walks without assistance.  She walks up to 30 minutes at a time.  She does drive.  She does not climb steps.  She is retired.  REVIEW OF SYSTEMS:  Notable for the difficulties as described above as well as some night sweats, chills, nausea, constipation, poor appetite, numbness, tingling, trouble walking, anxiety, depression.  No suicidal thoughts or aberrant behaviors.  Last pill count, UDS was consistent.  Past medical history, social history, and family history unchanged.  PHYSICAL EXAMINATION:  VITAL SIGNS:  Blood pressure is 134/85, pulse 63, respirations 14, O2 sats 96 on room air. MUSCULOSKELETAL:  Motor strength and sensation are intact. GENERAL:  Constitutionally, she is thin, she is alert and oriented x3. She has normal gait.  IMPRESSION: 1. Thoracolumbar scoliosis 2. Chronic pain. 3. Right knee osteoarthritis.  PLAN:  Refill hydrocodone 7.5/325 one p.o. q.i.d. as needed, 120 with no refill.  Questions were encouraged and answered.  I will see her in a month.     Delonna Ney L. Blima Dessert Electronically Signed    RLW/MedQ D:  11/05/2010 14:17:02  T:  11/06/2010 01:03:28  Job #:  [id number]

## 2010-12-04 ENCOUNTER — Encounter: Payer: Medicare Other | Attending: Neurosurgery | Admitting: Neurosurgery

## 2010-12-04 DIAGNOSIS — Q675 Congenital deformity of spine: Secondary | ICD-10-CM

## 2010-12-04 DIAGNOSIS — G894 Chronic pain syndrome: Secondary | ICD-10-CM

## 2010-12-04 DIAGNOSIS — M4120 Other idiopathic scoliosis, site unspecified: Secondary | ICD-10-CM | POA: Insufficient documentation

## 2010-12-04 DIAGNOSIS — M545 Low back pain, unspecified: Secondary | ICD-10-CM | POA: Insufficient documentation

## 2010-12-04 DIAGNOSIS — M1710 Unilateral primary osteoarthritis, unspecified knee: Secondary | ICD-10-CM

## 2010-12-04 DIAGNOSIS — G8929 Other chronic pain: Secondary | ICD-10-CM | POA: Insufficient documentation

## 2010-12-04 NOTE — Assessment & Plan Note (Signed)
This is a patient Dr. Wynn Banker seen for thoracolumbar scoliosis with low back pain.  She reports no change in her pain at a 5.  It is an aching type pain.  General activity level is 5-6.  Pain is worse at night.  Sleep patterns are fair.  Pain is worse with walking.  Therapy and medication tend to help.  She walks without assistance.  She can walk up to 25 minutes at a time.  She does not climb steps, but she does drive.  Functionally, she is retired.  REVIEW OF SYSTEMS:  Notable for difficulties described above as well as some night sweats, constipation, poor appetite, some depression.  No suicidal thoughts or aberrant behaviors, although she did not bring her bottle in for pill count today.  She will bring it back and then we will keep them filled as we see her back in over the next 3 months.  Past medical history, social history, and family history unchanged.  PHYSICAL EXAMINATION:  Blood pressure is 151/80, pulse 79, respirations 16, O2 sats 96 on room air.  Motor strength and sensation are intact. Given her age constitutionally, she is thin.  She is alert and oriented x3.  She has somewhat of an unsteady gait.  IMPRESSION: 1. Thoracolumbar scoliosis. 2. Chronic pain. 3. Right knee osteoarthritis.  PLAN:  Keep her hydrocodone 7.5/325 one p.o. q.i.d. filled when she brings her bottles back for count.  We will see her back in 3 months. Her questions were encouraged and answered.     Naliya Gish L. Blima Dessert Electronically Signed    RLW/MedQ D:  12/04/2010 13:30:26  T:  12/04/2010 22:03:47  Job #:  [id number]

## 2011-01-24 ENCOUNTER — Encounter: Payer: Medicare Other | Attending: Neurosurgery

## 2011-01-24 ENCOUNTER — Ambulatory Visit: Payer: Medicare Other | Admitting: Physical Medicine & Rehabilitation

## 2011-01-24 DIAGNOSIS — M4120 Other idiopathic scoliosis, site unspecified: Secondary | ICD-10-CM | POA: Insufficient documentation

## 2011-01-24 DIAGNOSIS — M1710 Unilateral primary osteoarthritis, unspecified knee: Secondary | ICD-10-CM | POA: Insufficient documentation

## 2011-01-24 DIAGNOSIS — M545 Low back pain, unspecified: Secondary | ICD-10-CM | POA: Insufficient documentation

## 2011-01-24 DIAGNOSIS — G8929 Other chronic pain: Secondary | ICD-10-CM | POA: Insufficient documentation

## 2011-01-24 NOTE — Assessment & Plan Note (Signed)
REASON FOR VISIT:  Back pain.  An 78 year old female with thoracolumbar scoliosis.  She also has kyphosis and lumbar stenosis.  In addition, she has right knee osteoarthritis.  Her pain on last visit was 5/10 and this visit it is 9/10.  She ran out of her hydrocodone.  She has been trying to exercise at home.  Has been doing stationary bicycle 5 miles per day.  She also does some stretching of her right knee.  Her walking tolerance is 30 minutes.  She can climb steps.  She can drive.  She is independent with all her activities except for certain household duties.  Numbness, tingling, trouble walking, spasms, depression, anxiety, constipation, abdominal pain, poor appetite all positive.  SOCIAL HISTORY:  Widowed, lives alone.  PHYSICAL EXAMINATION:  VITAL SIGNS:  Blood pressure 161/68, pulse 78, respirations 18 and O2 sat 98% on room air.  Weight 123 pounds, height 5 feet, 1. GENERAL:  Elderly female, in no acute distress.  Mood and affect appropriate. EXTREMITIES:  She has no pain to palpation in the lumbar paraspinal. She has obvious thoracolumbar scoliosis convex to the right.  She has a pelvic obliquity.  She has right knee without tenderness to palpation. No effusion.  She has good flexion, but lacks about 10 degrees of extension.  IMPRESSION: 1. Thoracolumbar kyphosis and scoliosis as well as lumbar stenosis.     No evidence of radiculopathy.  We will continue her on the     hydrocodone and she will do the exercise 2. Right knee osteoarthritis.  No need for injection at the current     time.  She uses Voltaren gel to her knee as needed, read the     labels, became scared of using it.  I did encourage her to use this     as only about 5% of it gets into the systemic circulation.  I will     see her back in 3 months.     Erick Colace, M.D. Electronically Signed    AEK/MedQ D:  01/24/2011 12:57:18  T:  01/24/2011 18:21:32  Job #:  [id number]  cc:   Eduardo Osier.  Sharyn Lull, M.D. Fax: [phone]

## 2011-02-26 ENCOUNTER — Other Ambulatory Visit: Payer: Self-pay | Admitting: Physical Medicine & Rehabilitation

## 2011-02-26 MED ORDER — HYDROCODONE-ACETAMINOPHEN 7.5-325 MG PO TABS: 1.0000 | ORAL_TABLET | Freq: Four times a day (QID) | ORAL | Status: DC | PRN

## 2011-02-26 NOTE — Telephone Encounter (Signed)
LM to let her know we have called in her RX.

## 2011-02-26 NOTE — Telephone Encounter (Signed)
refill on hydrocodone

## 2011-03-05 ENCOUNTER — Ambulatory Visit: Payer: Medicare Other | Admitting: Physical Medicine & Rehabilitation

## 2011-03-13 ENCOUNTER — Other Ambulatory Visit: Payer: Self-pay | Admitting: Cardiology

## 2011-04-09 ENCOUNTER — Telehealth: Payer: Self-pay | Admitting: *Deleted

## 2011-04-09 MED ORDER — HYDROCODONE-ACETAMINOPHEN 7.5-325 MG PO TABS: 1.0000 | ORAL_TABLET | Freq: Four times a day (QID) | ORAL | Status: DC | PRN

## 2011-04-09 NOTE — Telephone Encounter (Signed)
Requests for Dr Wynn Banker to refill her pain medication.  She has an appointment 04/22/11 with Dr Wynn Banker.  Last fill was Norco 7.5/325 #120, 1 q 6hr on 02/26/2011.  Medication refilled and Toney was notified.

## 2011-04-22 ENCOUNTER — Encounter: Payer: Medicare Other | Attending: Physical Medicine & Rehabilitation | Admitting: *Deleted

## 2011-04-22 ENCOUNTER — Ambulatory Visit: Payer: Medicare Other | Admitting: Physical Medicine & Rehabilitation

## 2011-04-22 ENCOUNTER — Encounter: Payer: Self-pay | Admitting: *Deleted

## 2011-04-22 VITALS — BP 154/81 | HR 66 | Resp 14 | Ht 62.0 in | Wt 120.0 lb

## 2011-04-22 DIAGNOSIS — M4200 Juvenile osteochondrosis of spine, site unspecified: Secondary | ICD-10-CM

## 2011-04-22 DIAGNOSIS — M4040 Postural lordosis, site unspecified: Secondary | ICD-10-CM

## 2011-04-22 DIAGNOSIS — M1710 Unilateral primary osteoarthritis, unspecified knee: Secondary | ICD-10-CM | POA: Insufficient documentation

## 2011-04-22 DIAGNOSIS — M4120 Other idiopathic scoliosis, site unspecified: Secondary | ICD-10-CM | POA: Insufficient documentation

## 2011-04-22 DIAGNOSIS — M5430 Sciatica, unspecified side: Secondary | ICD-10-CM

## 2011-04-22 DIAGNOSIS — M48061 Spinal stenosis, lumbar region without neurogenic claudication: Secondary | ICD-10-CM

## 2011-04-22 DIAGNOSIS — G8929 Other chronic pain: Secondary | ICD-10-CM | POA: Insufficient documentation

## 2011-04-22 DIAGNOSIS — M545 Low back pain, unspecified: Secondary | ICD-10-CM | POA: Insufficient documentation

## 2011-04-22 MED ORDER — HYDROCODONE-ACETAMINOPHEN 7.5-325 MG PO TABS: 1.0000 | ORAL_TABLET | Freq: Four times a day (QID) | ORAL | Status: DC | PRN

## 2011-04-22 NOTE — Progress Notes (Signed)
Pt states that Hydrocodone is still working for her but now her back pain is radiating into her buttock area. Advised pt to keep medication in a safe place.

## 2011-04-26 ENCOUNTER — Encounter: Payer: Self-pay | Admitting: Physical Medicine & Rehabilitation

## 2011-05-21 ENCOUNTER — Encounter: Payer: Self-pay | Admitting: Physical Medicine & Rehabilitation

## 2011-05-21 ENCOUNTER — Encounter: Payer: Medicare Other | Attending: Neurosurgery

## 2011-05-21 ENCOUNTER — Ambulatory Visit (HOSPITAL_BASED_OUTPATIENT_CLINIC_OR_DEPARTMENT_OTHER): Payer: Medicare Other | Admitting: Physical Medicine & Rehabilitation

## 2011-05-21 VITALS — BP 145/73 | HR 70 | Resp 16 | Ht 62.0 in | Wt 118.4 lb

## 2011-05-21 DIAGNOSIS — M1710 Unilateral primary osteoarthritis, unspecified knee: Secondary | ICD-10-CM | POA: Insufficient documentation

## 2011-05-21 DIAGNOSIS — M545 Low back pain, unspecified: Secondary | ICD-10-CM | POA: Insufficient documentation

## 2011-05-21 DIAGNOSIS — M4120 Other idiopathic scoliosis, site unspecified: Secondary | ICD-10-CM

## 2011-05-21 DIAGNOSIS — G8929 Other chronic pain: Secondary | ICD-10-CM | POA: Insufficient documentation

## 2011-05-21 DIAGNOSIS — M419 Scoliosis, unspecified: Secondary | ICD-10-CM

## 2011-05-21 MED ORDER — GABAPENTIN 100 MG PO CAPS: 100.0000 mg | ORAL_CAPSULE | Freq: Every day | ORAL | Status: DC

## 2011-05-21 NOTE — Patient Instructions (Signed)
Keep exercising bicycling and stretching

## 2011-05-21 NOTE — Progress Notes (Signed)
  Subjective:    Patient ID: Mariah Aguilar, female    DOB: September 13, 1922, 78 y.o.   MRN: [id number]  HPI Patient is complaining of some pain shooting into the box and into the legs associated with biking but feels that the worst at night No other new medical issues. Has to have another colonoscopy. Pain Inventory Average Pain 7 Pain Right Now 8 My pain is constant and burning  In the last 24 hours, has pain interfered with the following? General activity 6 Relation with others 5 Enjoyment of life 7 What TIME of day is your pain at its worst? night Sleep (in general) Poor  Pain is worse with: walking and standing Pain improves with: heat/ice and medication Relief from Meds: 5  Mobility walk without assistance how many minutes can you walk? 15 ability to climb steps?  yes do you drive?  yes  Function retired I need assistance with the following:  household duties and shopping  Neuro/Psych weakness numbness tingling anxiety  Prior Studies Any changes since last visit?  no  Physicians involved in your care Any changes since last visit?  no       Review of Systems  Constitutional: Positive for diaphoresis.       Night sweats  Gastrointestinal: Positive for abdominal pain and constipation.  Musculoskeletal: Positive for back pain.  Neurological: Positive for weakness and numbness.  Psychiatric/Behavioral: The patient is nervous/anxious.        Objective:   Physical Exam  Motor strength is 5/5 in both legs. There is no tenderness to touch in the lower back or mid back area. There is evidence of scoliosis in the thoracic and lumbar spine Range of motion in the hips knees and ankles are normal Ambulation is normal      Assessment & Plan:  1.Thoraco lumbar scoliosis with chronic back pain. Will continue hydrocodone. It is written for up to every 6 hours as needed but she is taking it only 1-2 times per day. 2. Sciatica Will start gabapentin at night 100  mg I will see the patient back in 3 months

## 2011-05-27 ENCOUNTER — Encounter (HOSPITAL_COMMUNITY): Payer: Self-pay | Admitting: *Deleted

## 2011-05-27 ENCOUNTER — Ambulatory Visit (HOSPITAL_COMMUNITY)
Admission: RE | Admit: 2011-05-27 | Discharge: 2011-05-27 | Disposition: A | Discharge status: Home / Self Care | Payer: Medicare Other | Source: Ambulatory Visit | Attending: Gastroenterology | Admitting: Gastroenterology

## 2011-05-27 ENCOUNTER — Encounter (HOSPITAL_COMMUNITY)
Admission: RE | Disposition: A | Discharge status: Home / Self Care | Payer: Self-pay | Source: Ambulatory Visit | Attending: Gastroenterology

## 2011-05-27 DIAGNOSIS — Z860100 Personal history of colon polyps, unspecified: Secondary | ICD-10-CM | POA: Insufficient documentation

## 2011-05-27 DIAGNOSIS — Z79899 Other long term (current) drug therapy: Secondary | ICD-10-CM | POA: Insufficient documentation

## 2011-05-27 DIAGNOSIS — I509 Heart failure, unspecified: Secondary | ICD-10-CM | POA: Insufficient documentation

## 2011-05-27 DIAGNOSIS — E785 Hyperlipidemia, unspecified: Secondary | ICD-10-CM | POA: Insufficient documentation

## 2011-05-27 DIAGNOSIS — K5730 Diverticulosis of large intestine without perforation or abscess without bleeding: Secondary | ICD-10-CM | POA: Insufficient documentation

## 2011-05-27 DIAGNOSIS — Z800 Family history of malignant neoplasm of digestive organs: Secondary | ICD-10-CM | POA: Insufficient documentation

## 2011-05-27 DIAGNOSIS — K5900 Constipation, unspecified: Secondary | ICD-10-CM | POA: Insufficient documentation

## 2011-05-27 DIAGNOSIS — I10 Essential (primary) hypertension: Secondary | ICD-10-CM | POA: Insufficient documentation

## 2011-05-27 HISTORY — DX: Other hemorrhoids: K64.8

## 2011-05-27 HISTORY — PX: COLONOSCOPY: SHX5424

## 2011-05-27 HISTORY — DX: Insomnia, unspecified: G47.00

## 2011-05-27 HISTORY — DX: Diverticulosis of intestine, part unspecified, without perforation or abscess without bleeding: K57.90

## 2011-05-27 HISTORY — DX: Hyperlipidemia, unspecified: E78.5

## 2011-05-27 HISTORY — DX: Neoplasm of unspecified behavior of digestive system: D49.0

## 2011-05-27 SURGERY — COLONOSCOPY
Anesthesia: Moderate Sedation

## 2011-05-27 MED ORDER — DIPHENHYDRAMINE HCL 50 MG/ML IJ SOLN
INTRAMUSCULAR | Status: AC
  Filled 2011-05-27: qty 1

## 2011-05-27 MED ORDER — SODIUM CHLORIDE 0.9 % IV SOLN: Freq: Once | INTRAVENOUS | Status: DC

## 2011-05-27 MED ORDER — FENTANYL CITRATE 0.05 MG/ML IJ SOLN
INTRAMUSCULAR | Status: DC | PRN
  Administered 2011-05-27 (×4): 25 ug via INTRAVENOUS

## 2011-05-27 MED ORDER — FENTANYL CITRATE 0.05 MG/ML IJ SOLN
INTRAMUSCULAR | Status: AC
  Filled 2011-05-27: qty 4

## 2011-05-27 MED ORDER — MIDAZOLAM HCL 10 MG/2ML IJ SOLN
INTRAMUSCULAR | Status: AC
  Filled 2011-05-27: qty 4

## 2011-05-27 MED ORDER — MIDAZOLAM HCL 10 MG/2ML IJ SOLN
INTRAMUSCULAR | Status: DC | PRN
  Administered 2011-05-27 (×4): 2.5 mg via INTRAVENOUS

## 2011-05-27 NOTE — Op Note (Addendum)
[facility] [address] [place], [place]  [address]  OPERATIVE PROCEDURE REPORT  PATIENT:  Mariah Aguilar, Mariah Aguilar  MR#:  [id number] BIRTHDATE:  October 23, 1922  GENDER:  female ENDOSCOPIST:  Dr. Lorenza Burton, MD ASSISTANT:  Beryle Beams, Technician and Debi Claudine Mouton, RN, [facility].  PROCEDURE DATE:  05/27/2011 PRE-PROCEDURE PREPERATION:  The patient was prepped with 2 dulcolax tablets, one ten-ounce bottle of magnesium citrate, and a gallon of Golytely the night prior to the procedure.  The patient was fasted for 4 hours prior to the procedure. PRE-PROCEDURE PHYSICAL:  Patient has stable vital signs. Neck is supple. There is no JVD, thyromegaly or LAD. Chest clear to auscultation. S1 and S2 regular. Abdomen soft, non-distended, non-tender with NABS. PROCEDURE:  Diagnostic colonoscopy. ASA CLASS:  Class IV INDICATIONS:  1) CRC screening, high risk  2) Personal istory of adenomatous polyps 3) Family history of colon cancer-mother. MEDICATIONS:  Fentanyl 100 mcg & Versed 10 mg IV.  DESCRIPTION OF PROCEDURE: After the risks, benefits, and alternatives of the procedure were thoroughly explained [including a 10% missed rate of cancer and polyps], informed consent was obtained.  Digital rectal exam was performed.  The [id number]) was introduced through the anus and advanced to the anastomosis, without limitations. The quality of the prep was fairly good. Multiple washes were done. Small lesions could be missed. The instrument was then slowly withdrawn as the colon was fully examined. <<PROCEDUREIMAGES>>  FINDINGS:  Sattered diverticula werenoted throughout the colon wih more extensive changes in the sigmoid colon. A healthy anastamosis was noted at 120 cm [the patient has had a right hemicolectomy]. The rest of the colonic mucosa appeared healthy with a normal vascular pattern.  No masses, polyps or AVM's were noted. Retroflexed views revealed no abnormalities.  The  patient tolerated the procedure without immediate complications.  The scope was then withdrawn from the patient and the procedure terminated.  IMPRESSION:  Pandiverticulosis with more extensive changes in the sigmoid colon; a healthy ansatamosis at 120 cm [patient is s/p right hemicolectomy].  RECOMMENDATIONS:  1) Continue surveillance. 2) High fiber diet with liberal fluid intake. 3) OP follow-up is advised on a PRN basis.  REPEAT EXAM:  In 3 years; in case the patient has any abnormal GI symptoms in the interim, she should contact the office immediately for further recommendations.  DISCHARGE INSTRUCTIONS:  Standard discharge instructions given.  ______________________________ Dr. Lorenza Burton, MD  CPT CODES: 53664  DIAGNOSIS CODES:  562.10, V16.0, V12.72, V76.51  CC:  Rinaldo Cloud, M.D.  n. REVISED:  05/27/2011 04:39 PM eSIGNED:   Dr. Lorenza Burton at 05/27/2011 04:39 PM  Susa Day, [phone]

## 2011-05-27 NOTE — H&P (Signed)
Mariah Aguilar is an 78 y.o. female.   Chief Complaint: Colorectal cancer screening. HOPI: Patient is here for a screening colonoscopy. 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 She has occasional constipation and takes Miralax on a PRN basis. Her mother died of colon cancer at the age of 19.  Past Medical History  Diagnosis Date  . CHF (congestive heart failure)   . Hypertension   . Colon polyps   . Arthritis   . Cardiomyopathy   . Hyperlipidemia   . Insomnia   . Internal hemorrhoids   . Cecal neoplasm   . Diverticulosis    Past Surgical History  Procedure Date  . Abdominal hysterectomy   . Colon surgery    Family History  Problem Relation Age of Onset  . Cancer Mother     COLON  . Stroke Father    Social History:  reports that she has never smoked. She has never used smokeless tobacco. She reports that she does not drink alcohol or use illicit drugs. Family history: Mother died of colon cancer at the age of 33.  Allergies:  Allergies  Allergen Reactions  . Penicillins     Medications Prior to Admission  Medication Sig Dispense Refill  . Amlodipine Besylate-Valsartan (EXFORGE PO) Take by mouth.       Marland Kitchen CARVEDILOL PO Take 6.25 mg by mouth 2 (two) times daily.        . diazepam (VALIUM) 10 MG tablet Take 10 mg by mouth every 6 (six) hours as needed.        . Digoxin (LANOXIN PO) Take by mouth 1 dose over 24 hours.        Marland Kitchen esomeprazole (NEXIUM) 40 MG capsule Take 40 mg by mouth daily.      . furosemide (LASIX) 40 MG tablet Take 40 mg by mouth 2 (two) times daily.        Marland Kitchen gabapentin (NEURONTIN) 100 MG capsule Take 1 capsule (100 mg total) by mouth at bedtime.  30 capsule  2  . HYDROcodone-acetaminophen (NORCO) 7.5-325 MG per tablet Take 1 tablet by mouth every 6 (six) hours as needed.  120 tablet  0  . potassium chloride (KLOR-CON) 8 MEQ CR tablet Take 8 mEq by mouth daily.        .  rosuvastatin (CRESTOR) 10 MG tablet Take 10 mg by mouth daily.        . diclofenac (VOLTAREN) 0.1 % ophthalmic solution 1 drop 4 (four) times daily.      . potassium chloride (K-DUR,KLOR-CON) 10 MEQ tablet Take 10 mEq by mouth 2 (two) times daily.      . ramipril (ALTACE) 10 MG tablet Take 10 mg by mouth 2 (two) times daily.          No results found for this or any previous visit (from the past 48 hour(s)). No results found.  Review of Systems  Constitutional: Negative.   HENT: Negative.  Negative for neck pain.   Eyes: Negative.   Cardiovascular: Negative for chest pain, palpitations, orthopnea, claudication, leg swelling and PND.  Gastrointestinal: Positive for constipation. Negative for heartburn, nausea and vomiting.  Genitourinary: Negative.   Musculoskeletal: Positive for joint pain. Negative for myalgias.  Skin: Negative.   Neurological: Negative.   Endo/Heme/Allergies: Negative.   Psychiatric/Behavioral: Negative.     Blood pressure 153/72, temperature 98.6 F (37 C), temperature source Oral, resp. rate 24, SpO2  99.00%. Physical Exam  Constitutional: She is oriented to person, place, and time. She appears well-developed and well-nourished.  HENT:  Head: Normocephalic and atraumatic.  Eyes: Conjunctivae and EOM are normal. Pupils are equal, round, and reactive to light.  Neck: Normal range of motion. Neck supple.  Cardiovascular: Normal rate and regular rhythm.   Respiratory: Effort normal and breath sounds normal.  GI: Soft. Bowel sounds are normal.  Musculoskeletal: Normal range of motion.  Neurological: She is alert and oriented to person, place, and time. She has normal reflexes.  Skin: Skin is warm and dry.  Psychiatric: She has a normal mood and affect. Her behavior is normal. Judgment and thought content normal.     Assessment/Plan Colorectal cancer screening/Family history of colon cancer/Personal history of cecal mass-adenoma with low grade dysplasia. Proceed  with a colonoscopy at this time.  [name] 05/27/2011, 2:00 PM

## 2011-05-28 ENCOUNTER — Encounter (HOSPITAL_COMMUNITY): Payer: Self-pay | Admitting: Gastroenterology

## 2011-06-12 ENCOUNTER — Telehealth: Payer: Self-pay | Admitting: *Deleted

## 2011-06-12 MED ORDER — HYDROCODONE-ACETAMINOPHEN 7.5-325 MG PO TABS: 1.0000 | ORAL_TABLET | Freq: Four times a day (QID) | ORAL | Status: DC | PRN

## 2011-06-12 NOTE — Telephone Encounter (Signed)
Refill on pain meds. Hydrocodone has been called in.

## 2011-07-17 ENCOUNTER — Telehealth: Payer: Self-pay | Admitting: Physical Medicine & Rehabilitation

## 2011-07-17 MED ORDER — HYDROCODONE-ACETAMINOPHEN 7.5-325 MG PO TABS: 1.0000 | ORAL_TABLET | Freq: Four times a day (QID) | ORAL | Status: DC | PRN

## 2011-07-17 NOTE — Telephone Encounter (Signed)
[facility]

## 2011-07-17 NOTE — Telephone Encounter (Signed)
Refill on Hydrocodone.  Will be on vacation for 3 weeks and will be out before next appointment

## 2011-07-29 ENCOUNTER — Other Ambulatory Visit: Payer: Self-pay | Admitting: Cardiology

## 2011-08-19 ENCOUNTER — Ambulatory Visit (HOSPITAL_BASED_OUTPATIENT_CLINIC_OR_DEPARTMENT_OTHER): Payer: Medicare Other | Admitting: Physical Medicine & Rehabilitation

## 2011-08-19 ENCOUNTER — Encounter: Payer: Medicare Other | Attending: Neurosurgery

## 2011-08-19 ENCOUNTER — Encounter: Payer: Self-pay | Admitting: Physical Medicine & Rehabilitation

## 2011-08-19 VITALS — BP 148/78 | HR 53 | Ht 62.0 in | Wt 118.4 lb

## 2011-08-19 DIAGNOSIS — M1710 Unilateral primary osteoarthritis, unspecified knee: Secondary | ICD-10-CM | POA: Insufficient documentation

## 2011-08-19 DIAGNOSIS — M4120 Other idiopathic scoliosis, site unspecified: Secondary | ICD-10-CM | POA: Insufficient documentation

## 2011-08-19 DIAGNOSIS — M545 Low back pain, unspecified: Secondary | ICD-10-CM | POA: Insufficient documentation

## 2011-08-19 DIAGNOSIS — G8929 Other chronic pain: Secondary | ICD-10-CM | POA: Insufficient documentation

## 2011-08-19 DIAGNOSIS — M1711 Unilateral primary osteoarthritis, right knee: Secondary | ICD-10-CM | POA: Insufficient documentation

## 2011-08-19 DIAGNOSIS — M419 Scoliosis, unspecified: Secondary | ICD-10-CM

## 2011-08-19 MED ORDER — HYDROCODONE-ACETAMINOPHEN 7.5-325 MG PO TABS: 1.0000 | ORAL_TABLET | Freq: Four times a day (QID) | ORAL | Status: DC | PRN

## 2011-08-19 NOTE — Progress Notes (Signed)
  Subjective:    Patient ID: Mariah Aguilar, female    DOB: [birth year], 78 y.o.   MRN: [id number]  HPI Still exercising 30 minutes twice a day. Granddaughters have moved out remains functionally independent. Discussed activities Pain Inventory Average Pain 8 Pain Right Now 8 My pain is intermittent  In the last 24 hours, has pain interfered with the following? General activity 8 Relation with others 8 Enjoyment of life 8 What TIME of day is your pain at its worst? night Sleep (in general) Fair  Pain is worse with: walking, bending and some activites Pain improves with: rest and pacing activities Relief from Meds: 7  Mobility ability to climb steps?  yes do you drive?  yes  Function retired  Neuro/Psych No problems in this area  Prior Studies Any changes since last visit?  no  Physicians involved in your care Any changes since last visit?  no   Family History  Problem Relation Age of Onset  . Cancer Mother     COLON  . Stroke Father    History   Social History  . Marital Status: Widowed    Spouse Name: N/A    Number of Children: N/A  . Years of Education: N/A   Social History Main Topics  . Smoking status: Never Smoker   . Smokeless tobacco: Never Used  . Alcohol Use: No  . Drug Use: No  . Sexually Active: None   Other Topics Concern  . None   Social History Narrative  . None   Past Surgical History  Procedure Date  . Abdominal hysterectomy   . Colon surgery   . Colonoscopy 05/27/2011    Procedure: COLONOSCOPY;  Surgeon: Charna Elizabeth, MD;  Location: [facility];  Service: Endoscopy;  Laterality: N/A;   Past Medical History  Diagnosis Date  . CHF (congestive heart failure)   . Hypertension   . Colon polyps   . Arthritis   . Cardiomyopathy   . Hyperlipidemia   . Insomnia   . Internal hemorrhoids   . Cecal neoplasm   . Diverticulosis    BP 148/78  Pulse 53  Ht 5\' 2"  (1.575 m)  Wt 118 lb 6.4 oz (53.706 kg)  BMI 21.66 kg/m2  SpO2  97%    Review of Systems  Gastrointestinal: Positive for constipation.  Musculoskeletal: Positive for back pain.  All other systems reviewed and are negative.       Objective:   Physical Exam  Constitutional: She is oriented to person, place, and time. She appears well-developed and well-nourished.  Neck: Normal range of motion.  Musculoskeletal:       Right knee: She exhibits decreased range of motion and swelling. no tenderness found.       Lumbar back: She exhibits deformity.       Lumbar dextro  Convex scoliosis  Neurological: She is alert and oriented to person, place, and time.  Psychiatric: She has a normal mood and affect.          Assessment & Plan:  1. Lumbar scoliosis 2. Right end-stage knee osteoarthritis Continue Norco no signs of abuse no signs of adverse reaction Activity as discussed. Continue stationary bicycling and upper extremity exercises

## 2011-08-19 NOTE — Patient Instructions (Signed)
Continue the exercises you are doing. You have a prescription for your pain medication that you will need to take your pharmacy.

## 2011-09-18 ENCOUNTER — Telehealth: Payer: Self-pay | Admitting: Physical Medicine & Rehabilitation

## 2011-09-18 MED ORDER — HYDROCODONE-ACETAMINOPHEN 7.5-325 MG PO TABS: 1.0000 | ORAL_TABLET | Freq: Four times a day (QID) | ORAL | Status: DC | PRN

## 2011-09-18 NOTE — Telephone Encounter (Signed)
Hydrocodone has been called in. Pt aware.

## 2011-09-18 NOTE — Telephone Encounter (Signed)
Needs refill called in to [facility].  Hydrocodone?

## 2011-10-21 ENCOUNTER — Telehealth: Payer: Self-pay | Admitting: Physical Medicine & Rehabilitation

## 2011-10-21 MED ORDER — HYDROCODONE-ACETAMINOPHEN 7.5-325 MG PO TABS: 1.0000 | ORAL_TABLET | Freq: Four times a day (QID) | ORAL | Status: DC | PRN

## 2011-10-21 NOTE — Telephone Encounter (Signed)
Refill on pain medication.  (hydrocodone)

## 2011-10-21 NOTE — Telephone Encounter (Signed)
Rx has been called in, pt aware. 

## 2011-11-15 ENCOUNTER — Encounter: Payer: Medicare Other | Attending: Neurosurgery

## 2011-11-15 ENCOUNTER — Encounter: Payer: Self-pay | Admitting: Physical Medicine & Rehabilitation

## 2011-11-15 ENCOUNTER — Ambulatory Visit (HOSPITAL_BASED_OUTPATIENT_CLINIC_OR_DEPARTMENT_OTHER): Payer: Medicare Other | Admitting: Physical Medicine & Rehabilitation

## 2011-11-15 VITALS — BP 145/71 | HR 75 | Resp 14 | Wt 120.8 lb

## 2011-11-15 DIAGNOSIS — M1710 Unilateral primary osteoarthritis, unspecified knee: Secondary | ICD-10-CM | POA: Insufficient documentation

## 2011-11-15 DIAGNOSIS — G8929 Other chronic pain: Secondary | ICD-10-CM | POA: Insufficient documentation

## 2011-11-15 DIAGNOSIS — M545 Low back pain, unspecified: Secondary | ICD-10-CM | POA: Insufficient documentation

## 2011-11-15 DIAGNOSIS — M1711 Unilateral primary osteoarthritis, right knee: Secondary | ICD-10-CM

## 2011-11-15 DIAGNOSIS — M533 Sacrococcygeal disorders, not elsewhere classified: Secondary | ICD-10-CM

## 2011-11-15 DIAGNOSIS — M532X8 Spinal instabilities, sacral and sacrococcygeal region: Secondary | ICD-10-CM

## 2011-11-15 DIAGNOSIS — M4120 Other idiopathic scoliosis, site unspecified: Secondary | ICD-10-CM

## 2011-11-15 DIAGNOSIS — Z5181 Encounter for therapeutic drug level monitoring: Secondary | ICD-10-CM

## 2011-11-15 DIAGNOSIS — M419 Scoliosis, unspecified: Secondary | ICD-10-CM

## 2011-11-15 NOTE — Patient Instructions (Signed)
Call if you decide on a spine injection

## 2011-11-15 NOTE — Addendum Note (Signed)
Addended by: Judd Gaudier on: 11/15/2011 12:33 PM   Modules accepted: Orders

## 2011-11-15 NOTE — Progress Notes (Signed)
  Subjective:    Patient ID: Mariah Aguilar, female    DOB: 02-27-1922, 78 y.o.   MRN: [id number]  HPI  Pain Inventory Average Pain 8 Pain Right Now 8 My pain is intermittent, stabbing and tingling  In the last 24 hours, has pain interfered with the following? General activity 1 Relation with others 1 Enjoyment of life 1 What TIME of day is your pain at its worst? night Sleep (in general) Fair  Pain is worse with: some activites Pain improves with: therapy/exercise and medication Relief from Meds: 6  Mobility walk without assistance how many minutes can you walk? 10 ability to climb steps?  yes do you drive?  no  Function not employed: date last employed   Neuro/Psych trouble walking  Prior Studies Any changes since last visit?  no  Physicians involved in your care Any changes since last visit?  no   Family History  Problem Relation Age of Onset  . Cancer Mother     COLON  . Stroke Father    History   Social History  . Marital Status: Widowed    Spouse Name: N/A    Number of Children: N/A  . Years of Education: N/A   Social History Main Topics  . Smoking status: Never Smoker   . Smokeless tobacco: Never Used  . Alcohol Use: No  . Drug Use: No  . Sexually Active: None   Other Topics Concern  . None   Social History Narrative  . None   Past Surgical History  Procedure Date  . Abdominal hysterectomy   . Colon surgery   . Colonoscopy 05/27/2011    Procedure: COLONOSCOPY;  Surgeon: Charna Elizabeth, MD;  Location: [facility];  Service: Endoscopy;  Laterality: N/A;   Past Medical History  Diagnosis Date  . CHF (congestive heart failure)   . Hypertension   . Colon polyps   . Arthritis   . Cardiomyopathy   . Hyperlipidemia   . Insomnia   . Internal hemorrhoids   . Cecal neoplasm   . Diverticulosis    BP 145/71  Pulse 75  Resp 14  Wt 120 lb 12.8 oz (54.795 kg)  SpO2 99%    Review of Systems  Constitutional: Positive for diaphoresis  and unexpected weight change.  Gastrointestinal: Positive for constipation.  Musculoskeletal: Positive for back pain and gait problem.  All other systems reviewed and are negative.       Objective:   Physical Exam  Constitutional: She appears well-developed and well-nourished.  HENT:  Head: Normocephalic and atraumatic.  Eyes: Conjunctivae normal and EOM are normal. Pupils are equal, round, and reactive to light.  Musculoskeletal:       Lumbar back: She exhibits decreased range of motion and bony tenderness.       - SLR  Bilateral PSIS tenderness  Neurological: She has normal strength. Gait normal.          Assessment & Plan:  1.Thoracic and Lumbar scoliosis with chronic pain. 2. 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 This is likely contributing to her pain symptoms. We discussed spine injections however she refuses. Continue current medications which is Norco 7.5 4 times a day RTC 3

## 2011-11-21 ENCOUNTER — Telehealth: Payer: Self-pay

## 2011-11-21 MED ORDER — HYDROCODONE-ACETAMINOPHEN 7.5-325 MG PO TABS: 1.0000 | ORAL_TABLET | Freq: Four times a day (QID) | ORAL | Status: DC | PRN

## 2011-11-21 NOTE — Telephone Encounter (Signed)
Patient called to get refill on her hydrocodone.  [facility]

## 2011-12-20 ENCOUNTER — Telehealth: Payer: Self-pay

## 2011-12-20 MED ORDER — HYDROCODONE-ACETAMINOPHEN 7.5-325 MG PO TABS: 1.0000 | ORAL_TABLET | Freq: Four times a day (QID) | ORAL | Status: DC | PRN

## 2011-12-20 NOTE — Telephone Encounter (Signed)
Patient called requesting refill of [facility]cod[facility] [address]

## 2011-12-20 NOTE — Telephone Encounter (Signed)
[facility]

## 2012-01-28 ENCOUNTER — Telehealth: Payer: Self-pay | Admitting: *Deleted

## 2012-01-28 MED ORDER — HYDROCODONE-ACETAMINOPHEN 7.5-325 MG PO TABS: 1.0000 | ORAL_TABLET | Freq: Four times a day (QID) | ORAL | Status: DC | PRN

## 2012-01-28 NOTE — Telephone Encounter (Signed)
Hydrocodone refilled.  

## 2012-01-28 NOTE — Telephone Encounter (Signed)
Patient needs refill on pian medication. Did not say which medication. [facility].

## 2012-02-14 ENCOUNTER — Encounter: Payer: Self-pay | Admitting: Physical Medicine & Rehabilitation

## 2012-02-14 ENCOUNTER — Ambulatory Visit (HOSPITAL_BASED_OUTPATIENT_CLINIC_OR_DEPARTMENT_OTHER): Payer: Medicare Other | Admitting: Physical Medicine & Rehabilitation

## 2012-02-14 ENCOUNTER — Encounter: Payer: Medicare Other | Attending: Neurosurgery

## 2012-02-14 VITALS — BP 151/75 | HR 67 | Resp 14 | Wt 121.8 lb

## 2012-02-14 DIAGNOSIS — M1710 Unilateral primary osteoarthritis, unspecified knee: Secondary | ICD-10-CM | POA: Insufficient documentation

## 2012-02-14 DIAGNOSIS — M545 Low back pain, unspecified: Secondary | ICD-10-CM | POA: Insufficient documentation

## 2012-02-14 DIAGNOSIS — M4120 Other idiopathic scoliosis, site unspecified: Secondary | ICD-10-CM | POA: Insufficient documentation

## 2012-02-14 DIAGNOSIS — M419 Scoliosis, unspecified: Secondary | ICD-10-CM

## 2012-02-14 DIAGNOSIS — G8929 Other chronic pain: Secondary | ICD-10-CM | POA: Insufficient documentation

## 2012-02-14 NOTE — Progress Notes (Signed)
  Subjective:    Patient ID: Mariah Aguilar, female    DOB: Nov 18, 1922, 79 y.o.   MRN: [id number]  HPI Asking about whether spine is getting more bent. Continues to exercise. Exercises 7 days per week, 30 minutes Pain Inventory Average Pain 8 Pain Right Now 8 My pain is intermittent  In the last 24 hours, has pain interfered with the following? General activity 1 Relation with others 1 Enjoyment of life 1 What TIME of day is your pain at its worst? night Sleep (in general) Fair  Pain is worse with: some activites Pain improves with: therapy/exercise Relief from Meds: 6  Mobility walk without assistance how many minutes can you walk? 10 ability to climb steps?  yes do you drive?  yes  Function not employed: date last employed  retired  Neuro/Psych trouble walking  Prior Studies Any changes since last visit?  no  Physicians involved in your care Any changes since last visit?  no   Family History  Problem Relation Age of Onset  . Cancer Mother     COLON  . Stroke Father    History   Social History  . Marital Status: Widowed    Spouse Name: N/A    Number of Children: N/A  . Years of Education: N/A   Social History Main Topics  . Smoking status: Never Smoker   . Smokeless tobacco: Never Used  . Alcohol Use: No  . Drug Use: No  . Sexually Active: None   Other Topics Concern  . None   Social History Narrative  . None   Past Surgical History  Procedure Date  . Abdominal hysterectomy   . Colon surgery   . Colonoscopy 05/27/2011    Procedure: COLONOSCOPY;  Surgeon: Charna Elizabeth, MD;  Location: [facility];  Service: Endoscopy;  Laterality: N/A;   Past Medical History  Diagnosis Date  . CHF (congestive heart failure)   . Hypertension   . Colon polyps   . Arthritis   . Cardiomyopathy   . Hyperlipidemia   . Insomnia   . Internal hemorrhoids   . Cecal neoplasm   . Diverticulosis    BP 151/75  Pulse 67  Resp 14  Wt 121 lb 12.8 oz (55.248  kg)  SpO2 99%    Review of Systems  Constitutional: Positive for diaphoresis and unexpected weight change.  Gastrointestinal: Positive for constipation.  Musculoskeletal: Positive for gait problem.  All other systems reviewed and are negative.       Objective:   Physical Exam  Constitutional: She appears well-developed and well-nourished.  HENT:  Head: Normocephalic and atraumatic.  Eyes: Conjunctivae normal and EOM are normal. Pupils are equal, round, and reactive to light.  Musculoskeletal:  Lumbar back: She exhibits decreased range of motion and bony tenderness.  - SLR  Bilateral PSIS tenderness  Neurological: She has normal strength. Gait normal.  R dextro convex thoracolumbar scoliosis      Assessment & Plan:  1.Thoracic and Lumbar scoliosis with chronic pain. We discussed that the rectal lumbar films done on a yearly basis would be the best means of monitoring any progression. 2. 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 This is likely contributing to her pain symptoms. Continue current medications which is Norco 7.5 4 times a day  RTC 3 Months

## 2012-02-14 NOTE — Patient Instructions (Signed)
Please get your x-ray done at [facility]. I'll look at the results. If there is anything serious we need to talk about our call you. Otherwise we will look at these in about 3 months

## 2012-02-24 ENCOUNTER — Ambulatory Visit
Admission: RE | Admit: 2012-02-24 | Discharge: 2012-02-24 | Disposition: A | Discharge status: Home / Self Care | Payer: Medicare Other | Source: Ambulatory Visit | Attending: Physical Medicine & Rehabilitation | Admitting: Physical Medicine & Rehabilitation

## 2012-02-24 ENCOUNTER — Telehealth: Payer: Self-pay | Admitting: *Deleted

## 2012-02-24 DIAGNOSIS — M1710 Unilateral primary osteoarthritis, unspecified knee: Secondary | ICD-10-CM

## 2012-02-24 DIAGNOSIS — M4120 Other idiopathic scoliosis, site unspecified: Secondary | ICD-10-CM

## 2012-02-24 DIAGNOSIS — M533 Sacrococcygeal disorders, not elsewhere classified: Secondary | ICD-10-CM

## 2012-02-24 DIAGNOSIS — M419 Scoliosis, unspecified: Secondary | ICD-10-CM

## 2012-02-24 NOTE — Telephone Encounter (Signed)
She says she has seen in the distant past a Dr Sedonia Small at [facility] orth[facility]ics.

## 2012-02-24 NOTE — Telephone Encounter (Signed)
May take Hydrocodone up to 6 times a day will be out early.  Will need extra meds for 1-2 mo with sup/inf pubic ramus fx I spoke to Dr. Application he recommends cane or walker use and he will see her in the office. Nothing emergent here.

## 2012-02-24 NOTE — Telephone Encounter (Signed)
Mariah Aguilar showed up at [facility] to have her hip x-rayed after a fall onto her right hip.  Order placed.

## 2012-02-24 NOTE — Telephone Encounter (Signed)
Mariah Aguilar has fx and needs to be seen by Dr Sedonia Small. Dr Wynn Banker wants her referred the.  See the message below for explanation of the x ray and reason for referral.

## 2012-02-24 NOTE — Telephone Encounter (Signed)
Called and left message for Mariah Aguilar to call us back about her x ray. She needs to call us back. (message to be given is that Dr Wynn Banker wants her to see an orthopedist because her x ray reveals she has a displaced fracture of her superior and inferior ramus)

## 2012-02-25 ENCOUNTER — Telehealth: Payer: Self-pay | Admitting: Physical Medicine & Rehabilitation

## 2012-02-25 NOTE — Telephone Encounter (Signed)
Requests refill of pain medication  

## 2012-02-26 MED ORDER — HYDROCODONE-ACETAMINOPHEN 7.5-325 MG PO TABS: ORAL_TABLET | ORAL | Status: DC

## 2012-02-26 NOTE — Telephone Encounter (Signed)
I notified Mariah Aguilar of the increase and reordered her hydrocodone 7.5-325 increasing to #180 (1 up to 6 times per day prn) for the next 1-2 months per Dr Wynn Banker. (last fill 01/28/12)

## 2012-02-26 NOTE — Telephone Encounter (Signed)
Patient called again regarding her medication.  Patient aware medication has been called in.

## 2012-03-16 ENCOUNTER — Telehealth: Payer: Self-pay | Admitting: Physical Medicine & Rehabilitation

## 2012-03-16 MED ORDER — HYDROCODONE-ACETAMINOPHEN 7.5-325 MG PO TABS: ORAL_TABLET | ORAL | Status: DC

## 2012-03-16 NOTE — Telephone Encounter (Signed)
Hydrocodone refilled.  Patient aware.

## 2012-03-16 NOTE — Telephone Encounter (Signed)
Requesting refill of her pain medication.

## 2012-03-19 ENCOUNTER — Telehealth: Payer: Self-pay

## 2012-03-19 NOTE — Telephone Encounter (Signed)
Do not put weight on right leg, use walker Do not do any type of exercise using the legs She needs to followup with Dr. Leslee Home tend to make sure that the fracture has not shifted. My last prescription and gave her at least double her usual pain medication.she may take this up to 4 times per day

## 2012-03-19 NOTE — Telephone Encounter (Signed)
Patient is in pain and would like something stronger.  Please advise.

## 2012-03-20 NOTE — Telephone Encounter (Signed)
Patient advised to not put weight on right leg, use walker, do not do any type of exercise using the legs, follow up with Dr Leslee Home and she may take her pain medication 4 times per day.

## 2012-05-01 ENCOUNTER — Telehealth: Payer: Self-pay

## 2012-05-01 MED ORDER — HYDROCODONE-ACETAMINOPHEN 7.5-325 MG PO TABS: 1.0000 | ORAL_TABLET | ORAL | Status: DC | PRN

## 2012-05-01 NOTE — Telephone Encounter (Signed)
Left message that hydrocodone was called in.  Also advised patient she needs to make an appointment and need notes from Dr [place].

## 2012-05-01 NOTE — Telephone Encounter (Signed)
Patient request pain medication be sent to [facility] pharmacy.  Please advise.

## 2012-05-01 NOTE — Telephone Encounter (Signed)
May Refill Norco 7.5 #180, one po  q4hr prn Needs appt next month Also please get follow up notes from Dr Leslee Home

## 2012-05-08 ENCOUNTER — Ambulatory Visit (HOSPITAL_BASED_OUTPATIENT_CLINIC_OR_DEPARTMENT_OTHER): Payer: Medicare Other | Admitting: Physical Medicine & Rehabilitation

## 2012-05-08 ENCOUNTER — Encounter: Payer: Self-pay | Admitting: Physical Medicine & Rehabilitation

## 2012-05-08 ENCOUNTER — Encounter: Payer: Medicare Other | Attending: Neurosurgery

## 2012-05-08 VITALS — BP 150/81 | HR 77 | Resp 16 | Ht 62.0 in | Wt 125.0 lb

## 2012-05-08 DIAGNOSIS — S32599A Other specified fracture of unspecified pubis, initial encounter for closed fracture: Secondary | ICD-10-CM | POA: Insufficient documentation

## 2012-05-08 DIAGNOSIS — G8929 Other chronic pain: Secondary | ICD-10-CM | POA: Insufficient documentation

## 2012-05-08 DIAGNOSIS — M545 Low back pain, unspecified: Secondary | ICD-10-CM | POA: Insufficient documentation

## 2012-05-08 DIAGNOSIS — S32519A Fracture of superior rim of unspecified pubis, initial encounter for closed fracture: Secondary | ICD-10-CM | POA: Insufficient documentation

## 2012-05-08 DIAGNOSIS — Z5181 Encounter for therapeutic drug level monitoring: Secondary | ICD-10-CM

## 2012-05-08 DIAGNOSIS — Z79899 Other long term (current) drug therapy: Secondary | ICD-10-CM

## 2012-05-08 DIAGNOSIS — S32591A Other specified fracture of right pubis, initial encounter for closed fracture: Secondary | ICD-10-CM

## 2012-05-08 DIAGNOSIS — M4120 Other idiopathic scoliosis, site unspecified: Secondary | ICD-10-CM | POA: Insufficient documentation

## 2012-05-08 DIAGNOSIS — S32511A Fracture of superior rim of right pubis, initial encounter for closed fracture: Secondary | ICD-10-CM

## 2012-05-08 DIAGNOSIS — S32509A Unspecified fracture of unspecified pubis, initial encounter for closed fracture: Secondary | ICD-10-CM

## 2012-05-08 DIAGNOSIS — M1710 Unilateral primary osteoarthritis, unspecified knee: Secondary | ICD-10-CM | POA: Insufficient documentation

## 2012-05-08 NOTE — Progress Notes (Signed)
  Subjective:    Patient ID: Mariah Aguilar, female    DOB: September 16, 1922, 79 y.o.   MRN: [id number]  HPI Sees Dr Leslee Home ortho, released from walker.  Pain is worse with prolonged sitting, groin pain better. Plans to get another Xray at ortho office  Now taking usual dose  Pain Inventory Average Pain n/a Pain Right Now 8 My pain is n/a  In the last 24 hours, has pain interfered with the following? General activity 1 Relation with others 1 Enjoyment of life 1 What TIME of day is your pain at its worst? n/a Sleep (in general) Fair  Pain is worse with: some activites Pain improves with: therapy/exercise Relief from Meds: 6  Mobility how many minutes can you walk? 12 ability to climb steps?  yes do you drive?  yes  Function not employed: date last employed n/a  Neuro/Psych trouble walking  Prior Studies Any changes since last visit?  no x-rays  Physicians involved in your care Any changes since last visit?  no   Family History  Problem Relation Age of Onset  . Cancer Mother     COLON  . Stroke Father    History   Social History  . Marital Status: Widowed    Spouse Name: N/A    Number of Children: N/A  . Years of Education: N/A   Social History Main Topics  . Smoking status: Never Smoker   . Smokeless tobacco: Never Used  . Alcohol Use: No  . Drug Use: No  . Sexually Active: None   Other Topics Concern  . None   Social History Narrative  . None   Past Surgical History  Procedure Laterality Date  . Abdominal hysterectomy    . Colon surgery    . Colonoscopy  05/27/2011    Procedure: COLONOSCOPY;  Surgeon: Charna Elizabeth, MD;  Location: [facility];  Service: Endoscopy;  Laterality: N/A;   Past Medical History  Diagnosis Date  . CHF (congestive heart failure)   . Hypertension   . Colon polyps   . Arthritis   . Cardiomyopathy   . Hyperlipidemia   . Insomnia   . Internal hemorrhoids   . Cecal neoplasm   . Diverticulosis    BP 150/81   Pulse 77  Resp 16  Ht 5\' 2"  (1.575 m)  Wt 125 lb (56.7 kg)  BMI 22.86 kg/m2  SpO2 95%     Review of Systems  Musculoskeletal: Positive for gait problem.  All other systems reviewed and are negative.       Objective:   Physical Exam Back has tenderness bilateral PSIS Evidence of right convex scoliosis Lower extremity strength is normal Negative straight leg raise test  General no acute distress Mood and affect appropriate Ambulation without assistive device cord flexed posture       Assessment & Plan:  .1. Chronic low back pain with scoliosis and sacroiliac dysfunction. She is at her baseline in regards to her back 2. Right superior and inferior pubic ramus fracture. Followup with orthopedics. She is basically taking her usual Norco 7.5 4 times a day. Her last prescription was for 180 tab. Next prescription will be back to 120 tablets .RTC 3 months

## 2012-05-08 NOTE — Patient Instructions (Addendum)
May do back strengthening exercise Start back exercising only 15 minutes a day, work up to 30 min/day See Dr Leslee Home

## 2012-05-25 ENCOUNTER — Telehealth: Payer: Self-pay | Admitting: Physical Medicine & Rehabilitation

## 2012-06-12 ENCOUNTER — Telehealth: Payer: Self-pay

## 2012-06-12 MED ORDER — HYDROCODONE-ACETAMINOPHEN 7.5-325 MG PO TABS: 1.0000 | ORAL_TABLET | ORAL | Status: DC | PRN

## 2012-06-12 NOTE — Telephone Encounter (Signed)
Patient called requesting hydrocodone refill.  Per last note patient is only taking 4 times a day and will get a quantity of 120.  This was called and in patient informed.

## 2012-07-14 ENCOUNTER — Other Ambulatory Visit: Payer: Self-pay

## 2012-07-14 MED ORDER — HYDROCODONE-ACETAMINOPHEN 7.5-325 MG PO TABS: 1.0000 | ORAL_TABLET | ORAL | Status: DC | PRN

## 2012-08-04 ENCOUNTER — Ambulatory Visit: Payer: Medicare Other | Admitting: Physical Medicine & Rehabilitation

## 2012-08-05 IMAGING — CR DG CHEST 2V
2 series · 2 of 2 positions shown · non-contrast
Comparison: 08/19/2006.

CLINICAL DATA: Preoperative chest x-ray for colon surgery.

CHEST - 2 VIEW

[view not recorded (1 of 2)]
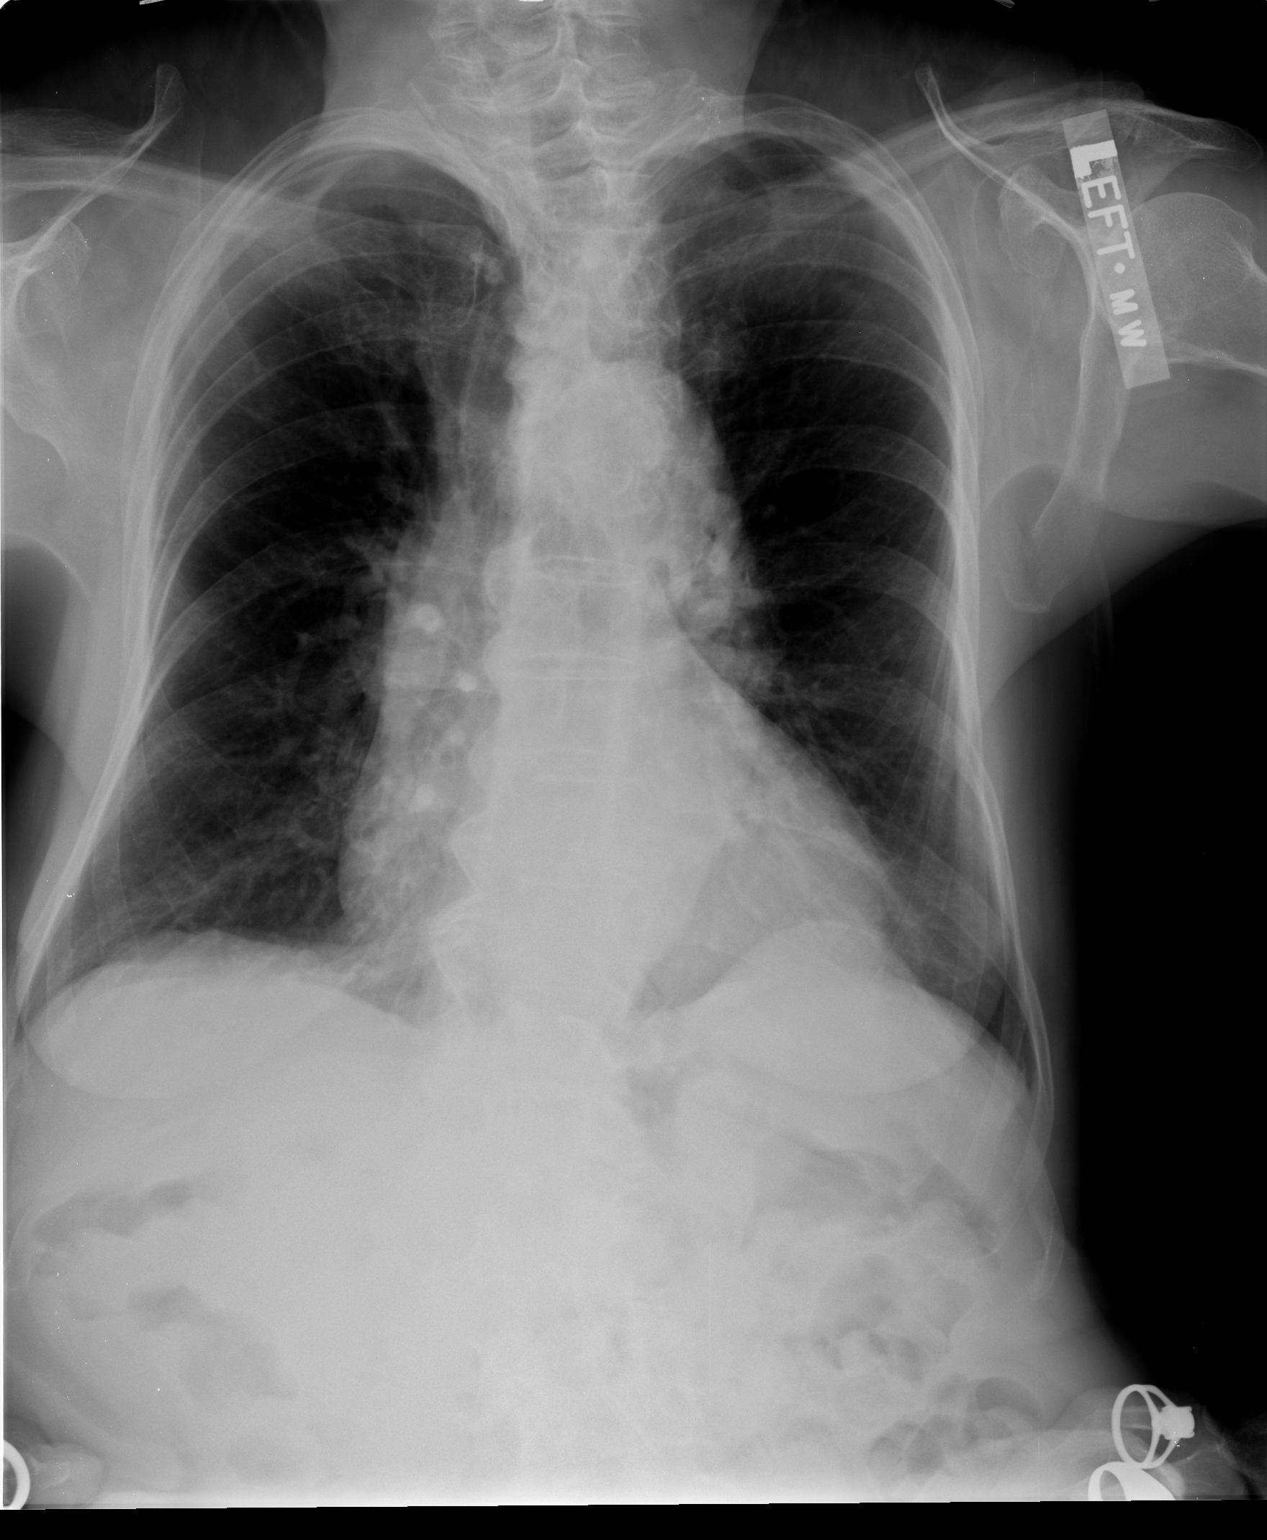

[view not recorded (2 of 2)]
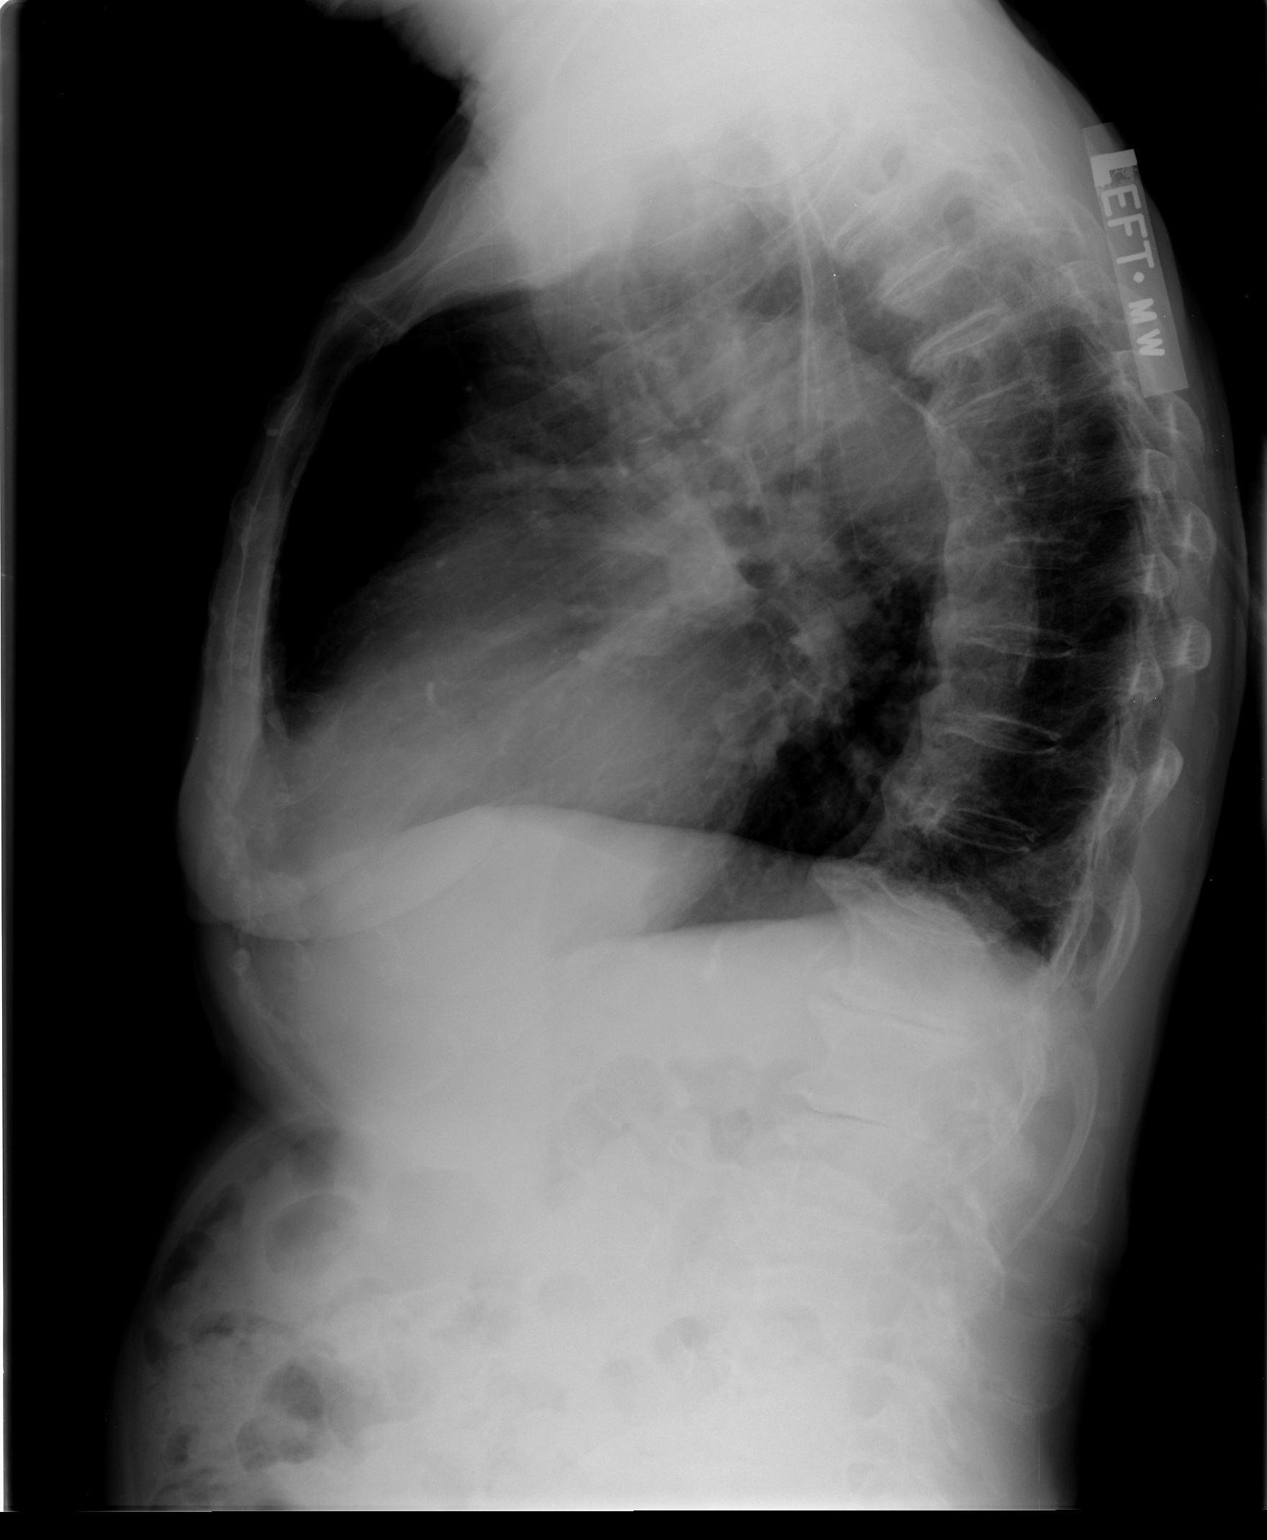

[2 of 2 positions shown; findings below may reference images not displayed]

FINDINGS: There are changes of COPD.  There is cardiomegaly which
is stable.  There are no infiltrates or edematous changes.
Thoracic osteophytosis is noted.
IMPRESSION: Changes of COPD.  Stable cardiomegaly.  No acute findings.

## 2012-08-06 ENCOUNTER — Ambulatory Visit: Payer: Medicare Other | Admitting: Physical Medicine & Rehabilitation

## 2012-08-07 ENCOUNTER — Ambulatory Visit: Payer: Medicare Other | Admitting: Physical Medicine & Rehabilitation

## 2012-08-21 ENCOUNTER — Encounter: Payer: Self-pay | Admitting: Physical Medicine & Rehabilitation

## 2012-08-21 ENCOUNTER — Ambulatory Visit (HOSPITAL_BASED_OUTPATIENT_CLINIC_OR_DEPARTMENT_OTHER): Payer: Medicare Other | Admitting: Physical Medicine & Rehabilitation

## 2012-08-21 ENCOUNTER — Encounter: Payer: Medicare Other | Attending: Neurosurgery

## 2012-08-21 VITALS — BP 124/65 | HR 70 | Resp 16 | Ht 64.0 in | Wt 126.0 lb

## 2012-08-21 DIAGNOSIS — M1710 Unilateral primary osteoarthritis, unspecified knee: Secondary | ICD-10-CM | POA: Insufficient documentation

## 2012-08-21 DIAGNOSIS — M4120 Other idiopathic scoliosis, site unspecified: Secondary | ICD-10-CM

## 2012-08-21 DIAGNOSIS — M419 Scoliosis, unspecified: Secondary | ICD-10-CM

## 2012-08-21 DIAGNOSIS — M545 Low back pain, unspecified: Secondary | ICD-10-CM | POA: Insufficient documentation

## 2012-08-21 DIAGNOSIS — G8929 Other chronic pain: Secondary | ICD-10-CM | POA: Insufficient documentation

## 2012-08-21 MED ORDER — HYDROCODONE-ACETAMINOPHEN 7.5-325 MG PO TABS: 1.0000 | ORAL_TABLET | Freq: Two times a day (BID) | ORAL | Status: DC

## 2012-08-21 NOTE — Progress Notes (Signed)
  Subjective:    Patient ID: Mariah Aguilar, female    DOB: [birth year], 79 y.o.   MRN: [id number]  HPI Bicycling again.   Pelvic fx pain improved Pain Inventory Average Pain 7 Pain Right Now 7 My pain is aching  In the last 24 hours, has pain interfered with the following? General activity 6 Relation with others 6 Enjoyment of life 7 What TIME of day is your pain at its worst? night Sleep (in general) Poor  Pain is worse with: walking Pain improves with: heat/ice and medication Relief from Meds: 6  Mobility walk without assistance walk with assistance how many minutes can you walk? 20 ability to climb steps?  yes do you drive?  yes Do you have any goals in this area?  yes  Function not employed: date last employed na retired  Neuro/Psych weakness numbness trouble walking anxiety  Prior Studies Any changes since last visit?  no  Physicians involved in your care Any changes since last visit?  no   Family History  Problem Relation Age of Onset  . Cancer Mother     COLON  . Stroke Father    History   Social History  . Marital Status: Widowed    Spouse Name: N/A    Number of Children: N/A  . Years of Education: N/A   Social History Main Topics  . Smoking status: Never Smoker   . Smokeless tobacco: Never Used  . Alcohol Use: No  . Drug Use: No  . Sexual Activity: None   Other Topics Concern  . None   Social History Narrative  . None   Past Surgical History  Procedure Laterality Date  . Abdominal hysterectomy    . Colon surgery    . Colonoscopy  05/27/2011    Procedure: COLONOSCOPY;  Surgeon: Charna Elizabeth, MD;  Location: [facility];  Service: Endoscopy;  Laterality: N/A;   Past Medical History  Diagnosis Date  . CHF (congestive heart failure)   . Hypertension   . Colon polyps   . Arthritis   . Cardiomyopathy   . Hyperlipidemia   . Insomnia   . Internal hemorrhoids   . Cecal neoplasm   . Diverticulosis    BP 124/65  Pulse 70   Resp 16  Ht 5\' 4"  (1.626 m)  Wt 126 lb (57.153 kg)  BMI 21.62 kg/m2  SpO2 96%     Review of Systems  Constitutional: Positive for diaphoresis and appetite change.  Musculoskeletal: Positive for gait problem.  Neurological: Positive for weakness and numbness.  Psychiatric/Behavioral: Positive for agitation.  All other systems reviewed and are negative.       Objective:   Physical Exam  Nursing note and vitals reviewed. Constitutional: She appears well-developed and well-nourished.  HENT:  Head: Normocephalic and atraumatic.  Musculoskeletal:       Lumbar back: She exhibits decreased range of motion.  Psychiatric: She has a normal mood and affect.          Assessment & Plan:  1. Chronic low back pain with scoliosis and sacroiliac dysfunction. She is at her baseline in regards to her back  2. Right superior and inferior pubic ramus fracture. Healed  She is basically taking her usual Norco 7.5 2 times a day. 60 tabs.RTC 3 months Advised core strengthening  Bicycling No pole dancing

## 2012-09-18 ENCOUNTER — Telehealth: Payer: Self-pay

## 2012-09-18 MED ORDER — HYDROCODONE-ACETAMINOPHEN 7.5-325 MG PO TABS: 1.0000 | ORAL_TABLET | Freq: Two times a day (BID) | ORAL | Status: DC

## 2012-09-18 NOTE — Telephone Encounter (Signed)
Hydrocodone called in 

## 2012-10-15 ENCOUNTER — Telehealth: Payer: Self-pay

## 2012-10-15 MED ORDER — HYDROCODONE-ACETAMINOPHEN 7.5-325 MG PO TABS: 1.0000 | ORAL_TABLET | Freq: Two times a day (BID) | ORAL | Status: DC

## 2012-10-15 NOTE — Telephone Encounter (Signed)
We can do this this month but will need to start monthly visits unless she can switch to T#3 or tramadol

## 2012-10-15 NOTE — Telephone Encounter (Signed)
Refill on Hydrocodone printed to be signed by Dr. Wynn Banker. Call Patient when ready for pick up.

## 2012-10-16 ENCOUNTER — Encounter: Payer: Self-pay | Admitting: Physical Medicine & Rehabilitation

## 2012-10-16 ENCOUNTER — Encounter: Payer: Medicare Other | Attending: Neurosurgery

## 2012-10-16 ENCOUNTER — Ambulatory Visit (HOSPITAL_BASED_OUTPATIENT_CLINIC_OR_DEPARTMENT_OTHER): Payer: Medicare Other | Admitting: Physical Medicine & Rehabilitation

## 2012-10-16 VITALS — BP 147/78 | HR 82 | Resp 14 | Ht 64.0 in | Wt 128.0 lb

## 2012-10-16 DIAGNOSIS — M545 Low back pain, unspecified: Secondary | ICD-10-CM | POA: Insufficient documentation

## 2012-10-16 DIAGNOSIS — M1710 Unilateral primary osteoarthritis, unspecified knee: Secondary | ICD-10-CM

## 2012-10-16 DIAGNOSIS — G8929 Other chronic pain: Secondary | ICD-10-CM | POA: Insufficient documentation

## 2012-10-16 DIAGNOSIS — M1711 Unilateral primary osteoarthritis, right knee: Secondary | ICD-10-CM

## 2012-10-16 DIAGNOSIS — M419 Scoliosis, unspecified: Secondary | ICD-10-CM

## 2012-10-16 DIAGNOSIS — M4120 Other idiopathic scoliosis, site unspecified: Secondary | ICD-10-CM

## 2012-10-16 DIAGNOSIS — Z5181 Encounter for therapeutic drug level monitoring: Secondary | ICD-10-CM

## 2012-10-16 DIAGNOSIS — Z79899 Other long term (current) drug therapy: Secondary | ICD-10-CM

## 2012-10-16 NOTE — Patient Instructions (Signed)
Continue your exercise program Please get me the bone density scan results

## 2012-10-16 NOTE — Progress Notes (Signed)
  Subjective:    Patient ID: Mariah Aguilar, female    DOB: 1922/10/03, 79 y.o.   MRN: [id number]  HPI  Pain Inventory Average Pain 7 Pain Right Now 7 My pain is aching  In the last 24 hours, has pain interfered with the following? General activity 9 Relation with others 9 Enjoyment of life 8 What TIME of day is your pain at its worst? night Sleep (in general) Fair  Pain is worse with: walking Pain improves with: heat/ice and medication Relief from Meds: 2  Mobility how many minutes can you walk? 10 ability to climb steps?  no do you drive?  yes  Function retired  Neuro/Psych No problems in this area  Prior Studies Any changes since last visit?  no  Physicians involved in your care Eye surgery   Family History  Problem Relation Age of Onset  . Cancer Mother     COLON  . Stroke Father    History   Social History  . Marital Status: Widowed    Spouse Name: N/A    Number of Children: N/A  . Years of Education: N/A   Social History Main Topics  . Smoking status: Never Smoker   . Smokeless tobacco: Never Used  . Alcohol Use: No  . Drug Use: No  . Sexual Activity: None   Other Topics Concern  . None   Social History Narrative  . None   Past Surgical History  Procedure Laterality Date  . Abdominal hysterectomy    . Colon surgery    . Colonoscopy  05/27/2011    Procedure: COLONOSCOPY;  Surgeon: Charna Elizabeth, MD;  Location: [facility];  Service: Endoscopy;  Laterality: N/A;   Past Medical History  Diagnosis Date  . CHF (congestive heart failure)   . Hypertension   . Colon polyps   . Arthritis   . Cardiomyopathy   . Hyperlipidemia   . Insomnia   . Internal hemorrhoids   . Cecal neoplasm   . Diverticulosis    BP 147/78  Pulse 82  Resp 14  Ht 5\' 4"  (1.626 m)  Wt 128 lb (58.06 kg)  BMI 21.96 kg/m2  SpO2 98%     Review of Systems  Constitutional: Positive for diaphoresis.  Gastrointestinal: Positive for constipation.   Musculoskeletal: Positive for arthralgias and myalgias.  All other systems reviewed and are negative.       Objective:   Physical Exam        Assessment & Plan:

## 2012-10-16 NOTE — Progress Notes (Signed)
Subjective:    Patient ID: Mariah Aguilar, female    DOB: 1922/11/09, 79 y.o.   MRN: [id number]  HPI Bicycling again.   Pelvic fx pain improved Reports having had a bone density study. Results are not available to me   Pain Inventory Average Pain 7 Pain Right Now 7 My pain is aching  In the last 24 hours, has pain interfered with the following? General activity 6 Relation with others 6 Enjoyment of life 7 What TIME of day is your pain at its worst? night Sleep (in general) Poor  Pain is worse with: walking Pain improves with: heat/ice and medication Relief from Meds: 6  Mobility walk without assistance walk with assistance how many minutes can you walk? 20 ability to climb steps?  yes do you drive?  yes Do you have any goals in this area?  yes  Function not employed: date last employed na retired  Neuro/Psych weakness numbness trouble walking anxiety  Prior Studies Any changes since last visit?  no  Physicians involved in your care Any changes since last visit?  no   Family History  Problem Relation Age of Onset  . Cancer Mother     COLON  . Stroke Father    History   Social History  . Marital Status: Widowed    Spouse Name: N/A    Number of Children: N/A  . Years of Education: N/A   Social History Main Topics  . Smoking status: Never Smoker   . Smokeless tobacco: Never Used  . Alcohol Use: No  . Drug Use: No  . Sexual Activity: None   Other Topics Concern  . None   Social History Narrative  . None   Past Surgical History  Procedure Laterality Date  . Abdominal hysterectomy    . Colon surgery    . Colonoscopy  05/27/2011    Procedure: COLONOSCOPY;  Surgeon: Charna Elizabeth, MD;  Location: [facility];  Service: Endoscopy;  Laterality: N/A;   Past Medical History  Diagnosis Date  . CHF (congestive heart failure)   . Hypertension   . Colon polyps   . Arthritis   . Cardiomyopathy   . Hyperlipidemia   . Insomnia   . Internal  hemorrhoids   . Cecal neoplasm   . Diverticulosis    BP 147/78  Pulse 82  Resp 14  Ht 5\' 4"  (1.626 m)  Wt 128 lb (58.06 kg)  BMI 21.96 kg/m2  SpO2 98%     Review of Systems  Constitutional: Positive for diaphoresis and appetite change.  Musculoskeletal: Positive for gait problem.  Neurological: Positive for weakness and numbness.  Psychiatric/Behavioral: Positive for agitation.  All other systems reviewed and are negative.       Objective:   Physical Exam  Nursing note and vitals reviewed. Constitutional: She appears well-developed and well-nourished.  HENT:  Head: Normocephalic and atraumatic.  Musculoskeletal:       Lumbar back: She exhibits decreased range of motion.  Psychiatric: She has a normal mood and affect.   Lower extremity strength is normal with hip flexors knee extensors ankle dorsiflexors and plantar flexors Gait is stenotic       Assessment & Plan:  1. Chronic low back pain with scoliosis and sacroiliac dysfunction. She is at her baseline in regards to her back  2. Right superior and inferior pubic ramus fracture. Healed  She is basically taking her usual Norco 7.5 2 times a day. 60 tabs.R The need for monthly visits now  that hydrocodone is schedule II Advised core strengthening  Bicycling No pole dancing

## 2012-10-22 ENCOUNTER — Ambulatory Visit: Payer: Medicare Other | Admitting: Physical Medicine & Rehabilitation

## 2012-10-28 ENCOUNTER — Telehealth: Payer: Self-pay

## 2012-10-28 NOTE — Telephone Encounter (Signed)
Message copied by Judd Gaudier on Wed Oct 28, 2012 11:17 AM ------      Message from: Su Monks      Created: Tue Oct 27, 2012  3:43 PM       Please ask patient whether she takes her medication regularly bid, and whether she also took the medication the days before the UDS. She is 79 years old, might have some memory problems. ------

## 2012-10-28 NOTE — Telephone Encounter (Signed)
Left message for patient to call office regarding her urine drug screen and whether she is taking her hydrocodone regularly.

## 2012-10-29 NOTE — Telephone Encounter (Signed)
Mikael called back and says that she takes it twice a day when she takes it but may not take it every day. She says she took it the night before her visit and doesn't know why it would not show up.

## 2012-11-13 ENCOUNTER — Encounter: Payer: Self-pay | Admitting: Physical Medicine & Rehabilitation

## 2012-11-13 ENCOUNTER — Ambulatory Visit (HOSPITAL_BASED_OUTPATIENT_CLINIC_OR_DEPARTMENT_OTHER): Payer: Medicare Other | Admitting: Physical Medicine & Rehabilitation

## 2012-11-13 ENCOUNTER — Encounter: Payer: Medicare Other | Attending: Neurosurgery

## 2012-11-13 VITALS — BP 155/75 | HR 73 | Resp 14 | Ht 64.0 in | Wt 126.0 lb

## 2012-11-13 DIAGNOSIS — M4120 Other idiopathic scoliosis, site unspecified: Secondary | ICD-10-CM | POA: Insufficient documentation

## 2012-11-13 DIAGNOSIS — G8929 Other chronic pain: Secondary | ICD-10-CM | POA: Insufficient documentation

## 2012-11-13 DIAGNOSIS — M545 Low back pain, unspecified: Secondary | ICD-10-CM | POA: Insufficient documentation

## 2012-11-13 DIAGNOSIS — M1711 Unilateral primary osteoarthritis, right knee: Secondary | ICD-10-CM

## 2012-11-13 DIAGNOSIS — M1710 Unilateral primary osteoarthritis, unspecified knee: Secondary | ICD-10-CM

## 2012-11-13 DIAGNOSIS — M419 Scoliosis, unspecified: Secondary | ICD-10-CM

## 2012-11-13 MED ORDER — CELECOXIB 100 MG PO CAPS: 100.0000 mg | ORAL_CAPSULE | Freq: Every day | ORAL | Status: DC

## 2012-11-13 MED ORDER — HYDROCODONE-ACETAMINOPHEN 7.5-325 MG PO TABS: 1.0000 | ORAL_TABLET | Freq: Two times a day (BID) | ORAL | Status: DC

## 2012-11-13 NOTE — Progress Notes (Signed)
Subjective:    Patient ID: Mariah Aguilar, female    DOB: 1922/12/08, 79 y.o.   MRN: [id number]  HPI February 2014, R sup/inf pubic ramus fracture No signs of aberant drug behavior, no falls, no side effects from medication Now healed radiologically , not back to baseline Pain Inventory Average Pain 6 Pain Right Now 5 My pain is aching  In the last 24 hours, has pain interfered with the following? General activity 5 Relation with others 6 Enjoyment of life 4 What TIME of day is your pain at its worst? night Sleep (in general) Fair  Pain is worse with: walking, standing and some activites Pain improves with: heat/ice, therapy/exercise and medication Relief from Meds: 6  Mobility walk without assistance how many minutes can you walk? 30 ability to climb steps?  yes do you drive?  yes transfers alone Do you have any goals in this area?  yes  Function retired I need assistance with the following:  household duties Do you have any goals in this area?  yes  Neuro/Psych No problems in this area  Prior Studies Any changes since last visit?  no  Physicians involved in your care Any changes since last visit?  no   Family History  Problem Relation Age of Onset  . Cancer Mother     COLON  . Stroke Father    History   Social History  . Marital Status: Widowed    Spouse Name: N/A    Number of Children: N/A  . Years of Education: N/A   Social History Main Topics  . Smoking status: Never Smoker   . Smokeless tobacco: Never Used  . Alcohol Use: No  . Drug Use: No  . Sexual Activity: None   Other Topics Concern  . None   Social History Narrative  . None   Past Surgical History  Procedure Laterality Date  . Abdominal hysterectomy    . Colon surgery    . Colonoscopy  05/27/2011    Procedure: COLONOSCOPY;  Surgeon: Charna Elizabeth, MD;  Location: [facility];  Service: Endoscopy;  Laterality: N/A;   Past Medical History  Diagnosis Date  . CHF (congestive  heart failure)   . Hypertension   . Colon polyps   . Arthritis   . Cardiomyopathy   . Hyperlipidemia   . Insomnia   . Internal hemorrhoids   . Cecal neoplasm   . Diverticulosis    BP 155/75  Pulse 73  Resp 14  Ht 5\' 4"  (1.626 m)  Wt 126 lb (57.153 kg)  BMI 21.62 kg/m2  SpO2 99%     Review of Systems  Constitutional: Positive for diaphoresis.  Gastrointestinal: Positive for constipation.  Musculoskeletal: Positive for back pain.  All other systems reviewed and are negative.       Objective:   Physical Exam  Musculoskeletal:       Arms:   Nursing note and vitals reviewed.  Constitutional: She appears well-developed and well-nourished.  HENT:  Head: Normocephalic and atraumatic.  Musculoskeletal:  Lumbar back: She exhibits decreased range of motion.  Psychiatric: She has a normal mood and affect.  Lower extremity strength is normal with hip flexors knee extensors ankle dorsiflexors and plantar flexors  Gait is stenotic Convex right lumbar scoliosis Tenderness bilateral PSIS      Assessment & Plan:  1. Chronic low back pain with scoliosis and sacroiliac dysfunction. She is at her baseline in regards to her back  2. Right superior and inferior pubic  ramus fracture. Healed  She is basically taking her usual Norco 7.5 2 times a day. 60 tabs. Return to clinic one month Advised core strengthening  Bicycling

## 2012-11-17 ENCOUNTER — Ambulatory Visit: Payer: Medicare Other | Admitting: Physical Medicine & Rehabilitation

## 2012-11-25 NOTE — Telephone Encounter (Signed)
We will keep an eye on her the next time, but she is 79 years old and might have not remembered correctly

## 2012-12-07 ENCOUNTER — Other Ambulatory Visit: Payer: Self-pay | Admitting: *Deleted

## 2012-12-07 MED ORDER — HYDROCODONE-ACETAMINOPHEN 7.5-325 MG PO TABS: 1.0000 | ORAL_TABLET | Freq: Two times a day (BID) | ORAL | Status: DC

## 2012-12-07 NOTE — Telephone Encounter (Signed)
RX for controlled medication printed for 12/11/12 visit with RN.

## 2012-12-11 ENCOUNTER — Telehealth: Payer: Self-pay | Admitting: *Deleted

## 2012-12-11 ENCOUNTER — Encounter: Payer: Self-pay | Admitting: *Deleted

## 2012-12-11 ENCOUNTER — Encounter: Payer: Medicare Other | Attending: Physical Medicine & Rehabilitation | Admitting: *Deleted

## 2012-12-11 VITALS — BP 128/59 | HR 63 | Resp 14 | Ht 63.0 in | Wt 125.6 lb

## 2012-12-11 DIAGNOSIS — M545 Low back pain, unspecified: Secondary | ICD-10-CM | POA: Insufficient documentation

## 2012-12-11 DIAGNOSIS — M4120 Other idiopathic scoliosis, site unspecified: Secondary | ICD-10-CM | POA: Insufficient documentation

## 2012-12-11 DIAGNOSIS — M419 Scoliosis, unspecified: Secondary | ICD-10-CM

## 2012-12-11 DIAGNOSIS — M1710 Unilateral primary osteoarthritis, unspecified knee: Secondary | ICD-10-CM | POA: Insufficient documentation

## 2012-12-11 DIAGNOSIS — S32591A Other specified fracture of right pubis, initial encounter for closed fracture: Secondary | ICD-10-CM

## 2012-12-11 DIAGNOSIS — G8929 Other chronic pain: Secondary | ICD-10-CM | POA: Insufficient documentation

## 2012-12-11 DIAGNOSIS — M1711 Unilateral primary osteoarthritis, right knee: Secondary | ICD-10-CM

## 2012-12-11 DIAGNOSIS — S32511A Fracture of superior rim of right pubis, initial encounter for closed fracture: Secondary | ICD-10-CM

## 2012-12-11 NOTE — Telephone Encounter (Signed)
Ms Durrett was in for her nurse visit and is complaining about more pain in her "spine" and is requesting a prescription for a neck soft collar. I told her I would pass this along to you and let her know.

## 2012-12-11 NOTE — Telephone Encounter (Signed)
check an x-ray of her neck. This is a new complaint. 2 view cervical spine at [facility]

## 2012-12-11 NOTE — Progress Notes (Signed)
Here for med refill on hydrocodone 7.5/325.  Fill date 11/14/12 # 60  Today NV# 0 appropriate.  Says she is having more spine pain and is requesting a prescription for a neck brace.  I explained I will have to send a message to Dr Wynn Banker with a request and after he reviews it if he thinks it is appropriate we can place an order.  Refilled her hydrocodone 7.5/325 # 60 1 bid prn given.

## 2012-12-11 NOTE — Patient Instructions (Signed)
Continue with exercise.  Happy Holidays!

## 2012-12-14 NOTE — Telephone Encounter (Signed)
Order placed.  Attempted to notify Mariah Aguilar but no answer and no voicemail.

## 2012-12-15 NOTE — Telephone Encounter (Signed)
I reached  Her but she would like to wait til after the first of the year. She mentioned something about her spinal injections so I think she just wants to wait. She has a follow up appt with me on the 5th of January and I will discuss it more then.

## 2013-01-04 ENCOUNTER — Other Ambulatory Visit: Payer: Self-pay | Admitting: *Deleted

## 2013-01-04 MED ORDER — HYDROCODONE-ACETAMINOPHEN 7.5-325 MG PO TABS: 1.0000 | ORAL_TABLET | Freq: Two times a day (BID) | ORAL | Status: DC

## 2013-01-04 NOTE — Telephone Encounter (Signed)
RX printed early for controlled medication for the visit with RN on 01/11/13 (to be signed by MD) 

## 2013-01-11 ENCOUNTER — Encounter: Payer: Medicare Other | Attending: Physical Medicine & Rehabilitation | Admitting: *Deleted

## 2013-01-11 ENCOUNTER — Encounter: Payer: Self-pay | Admitting: *Deleted

## 2013-01-11 VITALS — BP 149/51 | HR 69 | Resp 14

## 2013-01-11 DIAGNOSIS — Z760 Encounter for issue of repeat prescription: Secondary | ICD-10-CM | POA: Diagnosis not present

## 2013-01-11 DIAGNOSIS — M1711 Unilateral primary osteoarthritis, right knee: Secondary | ICD-10-CM

## 2013-01-11 DIAGNOSIS — IMO0001 Reserved for inherently not codable concepts without codable children: Secondary | ICD-10-CM | POA: Insufficient documentation

## 2013-01-11 DIAGNOSIS — M129 Arthropathy, unspecified: Secondary | ICD-10-CM | POA: Diagnosis not present

## 2013-01-11 DIAGNOSIS — M4120 Other idiopathic scoliosis, site unspecified: Secondary | ICD-10-CM | POA: Diagnosis not present

## 2013-01-11 DIAGNOSIS — M419 Scoliosis, unspecified: Secondary | ICD-10-CM

## 2013-01-11 NOTE — Progress Notes (Signed)
Here for pill count and medication refills. Hydrocodone 7. 5/325  # 60 Fill date 12/12/12    Today NV# 0   VSS    Pain level:8  Her neck is doing better this visit. She says the pain moves to various parts of her body and her neck is better at present. The pain currently is in her "butt". She exercises regularly and feels better but later she starts feeling the "athritis".  Pill count is appropriate and refill given.  Follow up one month with Dr Letta Pate.  Opioid risk score is 0.

## 2013-01-11 NOTE — Patient Instructions (Signed)
Follow up one month with Dr Kirsteins  

## 2013-01-13 DIAGNOSIS — H353290 Exudative age-related macular degeneration, unspecified eye, stage unspecified: Secondary | ICD-10-CM | POA: Diagnosis not present

## 2013-01-13 DIAGNOSIS — H35059 Retinal neovascularization, unspecified, unspecified eye: Secondary | ICD-10-CM | POA: Diagnosis not present

## 2013-01-25 DIAGNOSIS — E7800 Pure hypercholesterolemia, unspecified: Secondary | ICD-10-CM | POA: Diagnosis not present

## 2013-01-25 DIAGNOSIS — I428 Other cardiomyopathies: Secondary | ICD-10-CM | POA: Diagnosis not present

## 2013-01-25 DIAGNOSIS — M5137 Other intervertebral disc degeneration, lumbosacral region: Secondary | ICD-10-CM | POA: Diagnosis not present

## 2013-01-25 DIAGNOSIS — I10 Essential (primary) hypertension: Secondary | ICD-10-CM | POA: Diagnosis not present

## 2013-02-02 DIAGNOSIS — I10 Essential (primary) hypertension: Secondary | ICD-10-CM | POA: Diagnosis not present

## 2013-02-02 DIAGNOSIS — R0789 Other chest pain: Secondary | ICD-10-CM | POA: Diagnosis not present

## 2013-02-02 DIAGNOSIS — I428 Other cardiomyopathies: Secondary | ICD-10-CM | POA: Diagnosis not present

## 2013-02-02 DIAGNOSIS — E7800 Pure hypercholesterolemia, unspecified: Secondary | ICD-10-CM | POA: Diagnosis not present

## 2013-02-02 DIAGNOSIS — I509 Heart failure, unspecified: Secondary | ICD-10-CM | POA: Diagnosis not present

## 2013-02-05 DIAGNOSIS — Z1231 Encounter for screening mammogram for malignant neoplasm of breast: Secondary | ICD-10-CM | POA: Diagnosis not present

## 2013-02-08 DIAGNOSIS — N8111 Cystocele, midline: Secondary | ICD-10-CM | POA: Diagnosis not present

## 2013-02-09 DIAGNOSIS — M25569 Pain in unspecified knee: Secondary | ICD-10-CM | POA: Diagnosis not present

## 2013-02-11 ENCOUNTER — Encounter: Payer: Self-pay | Admitting: Physical Medicine & Rehabilitation

## 2013-02-11 ENCOUNTER — Ambulatory Visit (HOSPITAL_BASED_OUTPATIENT_CLINIC_OR_DEPARTMENT_OTHER): Payer: Medicare Other | Admitting: Physical Medicine & Rehabilitation

## 2013-02-11 ENCOUNTER — Encounter: Payer: Medicare Other | Attending: Neurosurgery

## 2013-02-11 VITALS — BP 134/67 | HR 81 | Resp 14 | Ht 62.0 in | Wt 138.0 lb

## 2013-02-11 DIAGNOSIS — M419 Scoliosis, unspecified: Secondary | ICD-10-CM

## 2013-02-11 DIAGNOSIS — M545 Low back pain, unspecified: Secondary | ICD-10-CM | POA: Diagnosis not present

## 2013-02-11 DIAGNOSIS — M1710 Unilateral primary osteoarthritis, unspecified knee: Secondary | ICD-10-CM | POA: Insufficient documentation

## 2013-02-11 DIAGNOSIS — M4120 Other idiopathic scoliosis, site unspecified: Secondary | ICD-10-CM | POA: Diagnosis not present

## 2013-02-11 DIAGNOSIS — G8929 Other chronic pain: Secondary | ICD-10-CM | POA: Insufficient documentation

## 2013-02-11 DIAGNOSIS — M1711 Unilateral primary osteoarthritis, right knee: Secondary | ICD-10-CM

## 2013-02-11 DIAGNOSIS — IMO0002 Reserved for concepts with insufficient information to code with codable children: Secondary | ICD-10-CM

## 2013-02-11 MED ORDER — DICLOFENAC SODIUM 1 % TD GEL: 2.0000 g | Freq: Four times a day (QID) | TRANSDERMAL | Status: DC

## 2013-02-11 MED ORDER — HYDROCODONE-ACETAMINOPHEN 7.5-325 MG PO TABS: 1.0000 | ORAL_TABLET | Freq: Two times a day (BID) | ORAL | Status: DC

## 2013-02-11 NOTE — Patient Instructions (Signed)
Continue to exercise and stay active

## 2013-02-11 NOTE — Progress Notes (Signed)
Subjective:    Patient ID: Mariah Aguilar, female    DOB: 12/20/1922, 80 y.o.   MRN: [id number]  HPI February 2014, R sup/inf pubic ramus fracture  No signs of aberant drug behavior, no falls, no side effects from medication  Now healed radiologically , not back to baseline Sees Dr Gladstone Lighter ortho Right knee injection did ok with synvisc Left knee synvisc scheduled Pain Inventory Average Pain 8 Pain Right Now 9 My pain is sharp, tingling and aching  In the last 24 hours, has pain interfered with the following? General activity 8 Relation with others 8 Enjoyment of life 8 What TIME of day is your pain at its worst? evening and night Sleep (in general) Fair  Pain is worse with: walking, standing and some activites Pain improves with: heat/ice and medication Relief from Meds: 8  Mobility walk without assistance how many minutes can you walk? 30 ability to climb steps?  yes do you drive?  yes  Function retired I need assistance with the following:  household duties and shopping  Neuro/Psych weakness numbness tingling trouble walking  Prior Studies Any changes since last visit?  no  Physicians involved in your care Any changes since last visit?  no   Family History  Problem Relation Age of Onset  . Cancer Mother     COLON  . Stroke Father    History   Social History  . Marital Status: Widowed    Spouse Name: N/A    Number of Children: N/A  . Years of Education: N/A   Social History Main Topics  . Smoking status: Never Smoker   . Smokeless tobacco: Never Used  . Alcohol Use: No  . Drug Use: No  . Sexual Activity: None   Other Topics Concern  . None   Social History Narrative  . None   Past Surgical History  Procedure Laterality Date  . Abdominal hysterectomy    . Colon surgery    . Colonoscopy  05/27/2011    Procedure: COLONOSCOPY;  Surgeon: Juanita Craver, MD;  Location: [facility];  Service: Endoscopy;  Laterality: N/A;   Past Medical  History  Diagnosis Date  . CHF (congestive heart failure)   . Hypertension   . Colon polyps   . Arthritis   . Cardiomyopathy   . Hyperlipidemia   . Insomnia   . Internal hemorrhoids   . Cecal neoplasm   . Diverticulosis    BP 134/67  Pulse 81  Resp 14  Ht 5\' 2"  (1.575 m)  Wt 138 lb (62.596 kg)  BMI 25.23 kg/m2  SpO2 100%  Opioid Risk Score: 0 Fall Risk Score: Moderate Fall Risk (6-13 points) (patient educated handout declined)   Review of Systems  Gastrointestinal: Positive for constipation.  Musculoskeletal: Positive for gait problem.  Neurological: Positive for weakness and numbness.  All other systems reviewed and are negative.       Objective:   Physical Exam  Head: Normocephalic and atraumatic.  Musculoskeletal:  Lumbar back: She exhibits decreased range of motion.  Psychiatric: She has a normal mood and affect.  Lower extremity strength is normal with hip flexors knee extensors ankle dorsiflexors and plantar flexors  Gait is stenotic  Convex right lumbar scoliosis  Tenderness bilateral PSIS       Assessment & Plan:  1. Chronic low back pain with scoliosis and sacroiliac dysfunction. She is at her baseline in regards to her back  2. Right superior and inferior pubic ramus fracture. Healed  She is basically taking her usual Norco 7.5 2 times a day. 60 tabs.  Return to clinic one month  Advised core strengthening  Bicycling

## 2013-02-17 DIAGNOSIS — H35059 Retinal neovascularization, unspecified, unspecified eye: Secondary | ICD-10-CM | POA: Diagnosis not present

## 2013-02-17 DIAGNOSIS — H353290 Exudative age-related macular degeneration, unspecified eye, stage unspecified: Secondary | ICD-10-CM | POA: Diagnosis not present

## 2013-03-09 ENCOUNTER — Other Ambulatory Visit: Payer: Self-pay | Admitting: *Deleted

## 2013-03-09 MED ORDER — HYDROCODONE-ACETAMINOPHEN 7.5-325 MG PO TABS: 1.0000 | ORAL_TABLET | Freq: Two times a day (BID) | ORAL | Status: DC

## 2013-03-09 NOTE — Telephone Encounter (Signed)
RX printed early for controlled medication for the visit with RN on 03/10/13 (to be signed by MD)

## 2013-03-10 ENCOUNTER — Encounter: Payer: Self-pay | Admitting: *Deleted

## 2013-03-10 ENCOUNTER — Encounter: Payer: Medicare Other | Attending: Physical Medicine & Rehabilitation | Admitting: *Deleted

## 2013-03-10 VITALS — BP 132/47 | HR 72 | Resp 14

## 2013-03-10 DIAGNOSIS — Z860100 Personal history of colon polyps, unspecified: Secondary | ICD-10-CM | POA: Insufficient documentation

## 2013-03-10 DIAGNOSIS — E785 Hyperlipidemia, unspecified: Secondary | ICD-10-CM | POA: Diagnosis not present

## 2013-03-10 DIAGNOSIS — M545 Low back pain, unspecified: Secondary | ICD-10-CM | POA: Diagnosis not present

## 2013-03-10 DIAGNOSIS — I428 Other cardiomyopathies: Secondary | ICD-10-CM | POA: Diagnosis not present

## 2013-03-10 DIAGNOSIS — G4700 Insomnia, unspecified: Secondary | ICD-10-CM | POA: Insufficient documentation

## 2013-03-10 DIAGNOSIS — M4120 Other idiopathic scoliosis, site unspecified: Secondary | ICD-10-CM | POA: Insufficient documentation

## 2013-03-10 DIAGNOSIS — G8929 Other chronic pain: Secondary | ICD-10-CM | POA: Diagnosis not present

## 2013-03-10 DIAGNOSIS — Z8719 Personal history of other diseases of the digestive system: Secondary | ICD-10-CM | POA: Insufficient documentation

## 2013-03-10 DIAGNOSIS — I10 Essential (primary) hypertension: Secondary | ICD-10-CM | POA: Insufficient documentation

## 2013-03-10 DIAGNOSIS — I509 Heart failure, unspecified: Secondary | ICD-10-CM | POA: Insufficient documentation

## 2013-03-10 DIAGNOSIS — M1711 Unilateral primary osteoarthritis, right knee: Secondary | ICD-10-CM

## 2013-03-10 DIAGNOSIS — M419 Scoliosis, unspecified: Secondary | ICD-10-CM

## 2013-03-10 NOTE — Patient Instructions (Addendum)
One month med refill with RN

## 2013-03-10 NOTE — Progress Notes (Signed)
Here for pill count and medication refills. Hydrocodone 7.5/325  #60 Fill date  02/11/13  Today NV#  0 VSS  No falls. Moderate fall risk.  Educated and home fall prevention handout given   Pill count appropriate.  Refill given. Return in one month for med refill.

## 2013-03-19 DIAGNOSIS — Z96659 Presence of unspecified artificial knee joint: Secondary | ICD-10-CM | POA: Diagnosis not present

## 2013-03-19 DIAGNOSIS — M25569 Pain in unspecified knee: Secondary | ICD-10-CM | POA: Diagnosis not present

## 2013-03-19 DIAGNOSIS — Z79899 Other long term (current) drug therapy: Secondary | ICD-10-CM | POA: Diagnosis not present

## 2013-03-19 DIAGNOSIS — G894 Chronic pain syndrome: Secondary | ICD-10-CM | POA: Diagnosis not present

## 2013-03-19 DIAGNOSIS — IMO0002 Reserved for concepts with insufficient information to code with codable children: Secondary | ICD-10-CM | POA: Diagnosis not present

## 2013-03-19 DIAGNOSIS — M1710 Unilateral primary osteoarthritis, unspecified knee: Secondary | ICD-10-CM | POA: Diagnosis not present

## 2013-03-23 DIAGNOSIS — M1710 Unilateral primary osteoarthritis, unspecified knee: Secondary | ICD-10-CM | POA: Diagnosis not present

## 2013-03-23 DIAGNOSIS — IMO0002 Reserved for concepts with insufficient information to code with codable children: Secondary | ICD-10-CM | POA: Diagnosis not present

## 2013-03-25 ENCOUNTER — Encounter: Payer: Self-pay | Admitting: Podiatry

## 2013-03-25 ENCOUNTER — Ambulatory Visit (INDEPENDENT_AMBULATORY_CARE_PROVIDER_SITE_OTHER): Payer: Medicare Other | Admitting: Podiatry

## 2013-03-25 VITALS — BP 143/65 | HR 60 | Resp 16

## 2013-03-25 DIAGNOSIS — L600 Ingrowing nail: Secondary | ICD-10-CM | POA: Diagnosis not present

## 2013-03-25 DIAGNOSIS — B351 Tinea unguium: Secondary | ICD-10-CM

## 2013-03-25 NOTE — Progress Notes (Signed)
Subjective:     Patient ID: Mariah Aguilar, female   DOB: 03-10-1922, 80 y.o.   MRN: [id number]  HPI patient states that her right big toenail is damaged fallen off and hurting. Been this way for several months   Review of Systems     Objective:   Physical Exam Neurovascular status intact with damaged thickened hallux nail right that is loose when pressed and she cannot take care of herself    Assessment:     Mycotic nail damaged infection right big    Plan:     H&P performed and conditions discussed explained the patient. I recommended tempora removal and explained procedure and she wants this done. Infiltrated 60 mg Xylocaine Marcaine mixture remove moved the hallux nail and provided for clean base and applied sterile dressing. Reappoint as needed

## 2013-03-25 NOTE — Patient Instructions (Signed)

## 2013-03-29 ENCOUNTER — Other Ambulatory Visit: Payer: Self-pay | Admitting: Physical Medicine & Rehabilitation

## 2013-03-30 DIAGNOSIS — M1710 Unilateral primary osteoarthritis, unspecified knee: Secondary | ICD-10-CM | POA: Diagnosis not present

## 2013-03-30 DIAGNOSIS — IMO0002 Reserved for concepts with insufficient information to code with codable children: Secondary | ICD-10-CM | POA: Diagnosis not present

## 2013-04-02 ENCOUNTER — Other Ambulatory Visit: Payer: Self-pay | Admitting: *Deleted

## 2013-04-02 MED ORDER — HYDROCODONE-ACETAMINOPHEN 7.5-325 MG PO TABS: 1.0000 | ORAL_TABLET | Freq: Two times a day (BID) | ORAL | Status: DC

## 2013-04-02 NOTE — Telephone Encounter (Signed)
Rx printed for MD to signed for RN visit 04/07/13

## 2013-04-06 ENCOUNTER — Encounter: Payer: Medicare Other | Admitting: *Deleted

## 2013-04-06 ENCOUNTER — Encounter: Payer: Self-pay | Admitting: *Deleted

## 2013-04-06 VITALS — BP 155/68 | HR 69 | Resp 14

## 2013-04-06 DIAGNOSIS — M419 Scoliosis, unspecified: Secondary | ICD-10-CM

## 2013-04-06 DIAGNOSIS — S32519A Fracture of superior rim of unspecified pubis, initial encounter for closed fracture: Secondary | ICD-10-CM

## 2013-04-06 NOTE — Patient Instructions (Signed)
Follow up RN med refill and 2 months with Dr Letta Pate

## 2013-04-06 NOTE — Progress Notes (Signed)
Here for pill count and medication refills. Hydrocodone 7.5/325 # 60 Fill date 03/11/13     Today NV# 0  VSS    Pain level: 8   No falls.  She is a moderate fall risk.  She has been given a handout and educated on fall prevention at home at previous visit.  She has an Advanced directive but it has not been scanned into chart. She will return in one month for med refill and two months with Dr Letta Pate.

## 2013-04-13 DIAGNOSIS — N8111 Cystocele, midline: Secondary | ICD-10-CM | POA: Diagnosis not present

## 2013-04-19 DIAGNOSIS — H35059 Retinal neovascularization, unspecified, unspecified eye: Secondary | ICD-10-CM | POA: Diagnosis not present

## 2013-04-19 DIAGNOSIS — H353290 Exudative age-related macular degeneration, unspecified eye, stage unspecified: Secondary | ICD-10-CM | POA: Diagnosis not present

## 2013-04-21 DIAGNOSIS — H35059 Retinal neovascularization, unspecified, unspecified eye: Secondary | ICD-10-CM | POA: Diagnosis not present

## 2013-04-21 DIAGNOSIS — H353290 Exudative age-related macular degeneration, unspecified eye, stage unspecified: Secondary | ICD-10-CM | POA: Diagnosis not present

## 2013-04-26 DIAGNOSIS — I428 Other cardiomyopathies: Secondary | ICD-10-CM | POA: Diagnosis not present

## 2013-04-26 DIAGNOSIS — I509 Heart failure, unspecified: Secondary | ICD-10-CM | POA: Diagnosis not present

## 2013-04-26 DIAGNOSIS — I10 Essential (primary) hypertension: Secondary | ICD-10-CM | POA: Diagnosis not present

## 2013-04-26 DIAGNOSIS — E7800 Pure hypercholesterolemia, unspecified: Secondary | ICD-10-CM | POA: Diagnosis not present

## 2013-05-04 ENCOUNTER — Encounter: Payer: Self-pay | Admitting: Registered Nurse

## 2013-05-04 ENCOUNTER — Encounter: Payer: Medicare Other | Attending: Physical Medicine & Rehabilitation | Admitting: Registered Nurse

## 2013-05-04 VITALS — BP 150/78 | HR 84 | Resp 14 | Ht 64.0 in | Wt 131.0 lb

## 2013-05-04 DIAGNOSIS — IMO0001 Reserved for inherently not codable concepts without codable children: Secondary | ICD-10-CM | POA: Diagnosis not present

## 2013-05-04 DIAGNOSIS — Z760 Encounter for issue of repeat prescription: Secondary | ICD-10-CM | POA: Insufficient documentation

## 2013-05-04 DIAGNOSIS — M419 Scoliosis, unspecified: Secondary | ICD-10-CM

## 2013-05-04 DIAGNOSIS — Z79899 Other long term (current) drug therapy: Secondary | ICD-10-CM | POA: Diagnosis not present

## 2013-05-04 DIAGNOSIS — M1711 Unilateral primary osteoarthritis, right knee: Secondary | ICD-10-CM

## 2013-05-04 DIAGNOSIS — M129 Arthropathy, unspecified: Secondary | ICD-10-CM | POA: Insufficient documentation

## 2013-05-04 DIAGNOSIS — IMO0002 Reserved for concepts with insufficient information to code with codable children: Secondary | ICD-10-CM

## 2013-05-04 DIAGNOSIS — M4120 Other idiopathic scoliosis, site unspecified: Secondary | ICD-10-CM

## 2013-05-04 DIAGNOSIS — M1710 Unilateral primary osteoarthritis, unspecified knee: Secondary | ICD-10-CM | POA: Diagnosis not present

## 2013-05-04 DIAGNOSIS — Z5181 Encounter for therapeutic drug level monitoring: Secondary | ICD-10-CM | POA: Diagnosis not present

## 2013-05-04 MED ORDER — HYDROCODONE-ACETAMINOPHEN 7.5-325 MG PO TABS: 1.0000 | ORAL_TABLET | Freq: Two times a day (BID) | ORAL | Status: DC

## 2013-05-04 NOTE — Progress Notes (Signed)
Subjective:    Patient ID: Mariah Aguilar, female    DOB: March 06, 1922, 80 y.o.   MRN: [id number]  HPI: Mariah Aguilar is an 80 year old female who returns for follow up for chronic pain and medication refill. She says her pain is in her lower back radiating into her buttocks and posterior bilateral legs at times. She rates her pain 8. Her current exercise regime is cycling, treadmill, stretching exercises and yoga. . Pain Inventory Average Pain 8 Pain Right Now 8 My pain is intermittent and sharp  In the last 24 hours, has pain interfered with the following? General activity 9 Relation with others 8 Enjoyment of life 8 What TIME of day is your pain at its worst? night Sleep (in general) Poor  Pain is worse with: walking and bending Pain improves with: heat/ice and medication Relief from Meds: 5  Mobility walk without assistance ability to climb steps?  yes do you drive?  yes transfers alone  Function not employed: date last employed na  Neuro/Psych numbness tingling  Prior Studies Any changes since last visit?  yes bone scan x-rays  Physicians involved in your care Any changes since last visit?  no   Family History  Problem Relation Age of Onset  . Cancer Mother     COLON  . Stroke Father    History   Social History  . Marital Status: Widowed    Spouse Name: N/A    Number of Children: N/A  . Years of Education: N/A   Social History Main Topics  . Smoking status: Never Smoker   . Smokeless tobacco: Never Used  . Alcohol Use: No  . Drug Use: No  . Sexual Activity: None   Other Topics Concern  . None   Social History Narrative  . None   Past Surgical History  Procedure Laterality Date  . Abdominal hysterectomy    . Colon surgery    . Colonoscopy  05/27/2011    Procedure: COLONOSCOPY;  Surgeon: Juanita Craver, MD;  Location: [facility];  Service: Endoscopy;  Laterality: N/A;   Past Medical History  Diagnosis Date  . CHF (congestive  heart failure)   . Hypertension   . Colon polyps   . Arthritis   . Cardiomyopathy   . Hyperlipidemia   . Insomnia   . Internal hemorrhoids   . Cecal neoplasm   . Diverticulosis    BP 150/78  Pulse 84  Resp 14  Ht 5\' 4"  (1.626 m)  Wt 131 lb (59.421 kg)  BMI 22.47 kg/m2  SpO2 97%  Opioid Risk Score:   Fall Risk Score: Moderate Fall Risk (6-13 points) (pt educated and given a brochure on fall risk previously)    Review of Systems  Constitutional: Positive for diaphoresis.  Musculoskeletal: Positive for back pain.  Neurological: Positive for numbness.       Tingling  All other systems reviewed and are negative.      Objective:   Physical Exam  Nursing note and vitals reviewed. Constitutional: She is oriented to person, place, and time. She appears well-developed and well-nourished.  HENT:  Head: Normocephalic.  Neck: Normal range of motion. Neck supple.  Cardiovascular: Normal rate, regular rhythm and normal heart sounds.   Pulmonary/Chest: Effort normal and breath sounds normal.  Musculoskeletal:  Normal Muscle Bulk: Muscle testing reveals: Upper and lower Extremities 5/5 muscle strength. Spinal Flexion: 90 degrees Lumbar paraspinal tenderness: L-4;L-5 Bilateral Knee Braces intact. Narrow Based gait. Arises from chair  with ease    Neurological: She is alert and oriented to person, place, and time.  Skin: Skin is warm and dry.  Psychiatric: She has a normal mood and affect.          Assessment & Plan:   1. Chronic low back pain with scoliosis and sacroiliac dysfunction.Refilled:    HYDROcodone: 7.5/325mg  one tablet by mouth BID #60.     15 minutes of face to face patient care time was spent during this visit. All questions were encouraged and answered.  F/U in 1 month

## 2013-05-26 DIAGNOSIS — H35059 Retinal neovascularization, unspecified, unspecified eye: Secondary | ICD-10-CM | POA: Diagnosis not present

## 2013-05-26 DIAGNOSIS — H353290 Exudative age-related macular degeneration, unspecified eye, stage unspecified: Secondary | ICD-10-CM | POA: Diagnosis not present

## 2013-05-27 DIAGNOSIS — H353290 Exudative age-related macular degeneration, unspecified eye, stage unspecified: Secondary | ICD-10-CM | POA: Diagnosis not present

## 2013-05-27 DIAGNOSIS — H35059 Retinal neovascularization, unspecified, unspecified eye: Secondary | ICD-10-CM | POA: Diagnosis not present

## 2013-06-04 ENCOUNTER — Encounter: Payer: Self-pay | Admitting: Physical Medicine & Rehabilitation

## 2013-06-04 ENCOUNTER — Ambulatory Visit (HOSPITAL_BASED_OUTPATIENT_CLINIC_OR_DEPARTMENT_OTHER): Payer: Medicare Other | Admitting: Physical Medicine & Rehabilitation

## 2013-06-04 ENCOUNTER — Encounter: Payer: Medicare Other | Attending: Neurosurgery

## 2013-06-04 VITALS — BP 155/80 | HR 78 | Resp 14 | Ht 64.0 in | Wt 129.0 lb

## 2013-06-04 DIAGNOSIS — G8929 Other chronic pain: Secondary | ICD-10-CM | POA: Diagnosis not present

## 2013-06-04 DIAGNOSIS — M545 Low back pain, unspecified: Secondary | ICD-10-CM | POA: Diagnosis not present

## 2013-06-04 DIAGNOSIS — M4120 Other idiopathic scoliosis, site unspecified: Secondary | ICD-10-CM | POA: Diagnosis not present

## 2013-06-04 DIAGNOSIS — M1711 Unilateral primary osteoarthritis, right knee: Secondary | ICD-10-CM

## 2013-06-04 DIAGNOSIS — M1710 Unilateral primary osteoarthritis, unspecified knee: Secondary | ICD-10-CM | POA: Insufficient documentation

## 2013-06-04 DIAGNOSIS — IMO0002 Reserved for concepts with insufficient information to code with codable children: Secondary | ICD-10-CM | POA: Diagnosis not present

## 2013-06-04 DIAGNOSIS — M419 Scoliosis, unspecified: Secondary | ICD-10-CM

## 2013-06-04 MED ORDER — HYDROCODONE-ACETAMINOPHEN 7.5-325 MG PO TABS: 1.0000 | ORAL_TABLET | Freq: Two times a day (BID) | ORAL | Status: DC

## 2013-06-04 NOTE — Patient Instructions (Signed)
Do not lean to Left side Use heat for back

## 2013-06-04 NOTE — Progress Notes (Signed)
   Subjective:    Patient ID: Mariah Aguilar, female    DOB: [birth year], 80 y.o.   MRN: [id number] February 2014, R sup/inf pubic ramus fracture  No signs of aberant drug behavior, no falls, no side effects from medication  Now healed radiologically , not back to baseline  Sees Dr Gladstone Lighter ortho  HPI Catch in Left side  Using ice on knee  Advise heat for back Pain Inventory Average Pain 8 Pain Right Now 7 My pain is sharp  In the last 24 hours, has pain interfered with the following? General activity 7 Relation with others 7 Enjoyment of life 8 What TIME of day is your pain at its worst? evening Sleep (in general) Poor  Pain is worse with: walking and sitting Pain improves with: heat/ice and medication Relief from Meds: 6  Mobility walk without assistance how many minutes can you walk? 30 ability to climb steps?  yes do you drive?  yes  Function retired I need assistance with the following:  household duties and shopping  Neuro/Psych numbness tingling  Prior Studies Any changes since last visit?  no  Physicians involved in your care Primary care .   Family History  Problem Relation Age of Onset  . Cancer Mother     COLON  . Stroke Father    History   Social History  . Marital Status: Widowed    Spouse Name: N/A    Number of Children: N/A  . Years of Education: N/A   Social History Main Topics  . Smoking status: Never Smoker   . Smokeless tobacco: Never Used  . Alcohol Use: No  . Drug Use: No  . Sexual Activity: None   Other Topics Concern  . None   Social History Narrative  . None   Past Surgical History  Procedure Laterality Date  . Abdominal hysterectomy    . Colon surgery    . Colonoscopy  05/27/2011    Procedure: COLONOSCOPY;  Surgeon: Juanita Craver, MD;  Location: [facility];  Service: Endoscopy;  Laterality: N/A;   Past Medical History  Diagnosis Date  . CHF (congestive heart failure)   . Hypertension   . Colon polyps   .  Arthritis   . Cardiomyopathy   . Hyperlipidemia   . Insomnia   . Internal hemorrhoids   . Cecal neoplasm   . Diverticulosis    BP 155/80  Pulse 78  Resp 14  Ht 5\' 4"  (1.626 m)  Wt 129 lb (58.514 kg)  BMI 22.13 kg/m2  SpO2 99%  Opioid Risk Score:   Fall Risk Score: Moderate Fall Risk (6-13 points) (patient educated handout declined)   Review of Systems  Constitutional: Positive for diaphoresis.  Gastrointestinal: Positive for constipation.  Neurological: Positive for numbness.  All other systems reviewed and are negative.      Objective:   Physical Exam R convex lumbar scoliosis Knee pain Tenderness over left 12th rib Lower extremity strength is normal Gait shows no evidence of toe drag or knee instability     Assessment & Plan:  1.  Chronic low back pain scoliosis and SI dysfunction Continue chronic low dose narcotic analgesic medication. No signs of ever drug behavior. No signs of significant side effects  Hydrocodone some 7.5 mg per os twice a day #60 month supply 2.  Impingement of Left 12th rib on iliac crest, educated patient on etiology. This is related to her scoliosis. Avoid left lateral bending 3.  Knee OA no symptoms

## 2013-06-17 DIAGNOSIS — E7800 Pure hypercholesterolemia, unspecified: Secondary | ICD-10-CM | POA: Diagnosis not present

## 2013-06-17 DIAGNOSIS — I10 Essential (primary) hypertension: Secondary | ICD-10-CM | POA: Diagnosis not present

## 2013-06-17 DIAGNOSIS — I2510 Atherosclerotic heart disease of native coronary artery without angina pectoris: Secondary | ICD-10-CM | POA: Diagnosis not present

## 2013-06-17 DIAGNOSIS — R7309 Other abnormal glucose: Secondary | ICD-10-CM | POA: Diagnosis not present

## 2013-06-28 DIAGNOSIS — N8111 Cystocele, midline: Secondary | ICD-10-CM | POA: Diagnosis not present

## 2013-06-30 DIAGNOSIS — H353290 Exudative age-related macular degeneration, unspecified eye, stage unspecified: Secondary | ICD-10-CM | POA: Diagnosis not present

## 2013-06-30 DIAGNOSIS — H35059 Retinal neovascularization, unspecified, unspecified eye: Secondary | ICD-10-CM | POA: Diagnosis not present

## 2013-07-02 ENCOUNTER — Encounter: Payer: Medicare Other | Attending: Physical Medicine & Rehabilitation | Admitting: Registered Nurse

## 2013-07-02 ENCOUNTER — Encounter: Payer: Self-pay | Admitting: Registered Nurse

## 2013-07-02 VITALS — BP 157/83 | HR 69 | Resp 14 | Wt 130.0 lb

## 2013-07-02 DIAGNOSIS — Z5181 Encounter for therapeutic drug level monitoring: Secondary | ICD-10-CM

## 2013-07-02 DIAGNOSIS — M1711 Unilateral primary osteoarthritis, right knee: Secondary | ICD-10-CM

## 2013-07-02 DIAGNOSIS — M4120 Other idiopathic scoliosis, site unspecified: Secondary | ICD-10-CM | POA: Insufficient documentation

## 2013-07-02 DIAGNOSIS — Z760 Encounter for issue of repeat prescription: Secondary | ICD-10-CM | POA: Insufficient documentation

## 2013-07-02 DIAGNOSIS — M129 Arthropathy, unspecified: Secondary | ICD-10-CM | POA: Insufficient documentation

## 2013-07-02 DIAGNOSIS — IMO0001 Reserved for inherently not codable concepts without codable children: Secondary | ICD-10-CM | POA: Insufficient documentation

## 2013-07-02 DIAGNOSIS — M419 Scoliosis, unspecified: Secondary | ICD-10-CM

## 2013-07-02 DIAGNOSIS — M1710 Unilateral primary osteoarthritis, unspecified knee: Secondary | ICD-10-CM | POA: Diagnosis not present

## 2013-07-02 DIAGNOSIS — IMO0002 Reserved for concepts with insufficient information to code with codable children: Secondary | ICD-10-CM | POA: Diagnosis not present

## 2013-07-02 DIAGNOSIS — Z79899 Other long term (current) drug therapy: Secondary | ICD-10-CM

## 2013-07-02 MED ORDER — HYDROCODONE-ACETAMINOPHEN 7.5-325 MG PO TABS: 1.0000 | ORAL_TABLET | Freq: Two times a day (BID) | ORAL | Status: DC

## 2013-07-02 NOTE — Progress Notes (Signed)
Subjective:    Patient ID: Mariah Aguilar, female    DOB: 01-27-1922, 80 y.o.   MRN: [id number]  HPI: [name] is a 80 year old female who returns for follow up for chronic pain and medication refill. She says her pain is located on her middle back mainly left side. She rates her pain 9. Her current exercise regime is is using stationary bicycle, and treadmill, also performing stretching exercises. She exercises 6 days a week. She has been having increase intensity of pain in her back. She feels the exercise is aggravating the pain. She will start exercising every other day. We will continue to monitor.  Pain Inventory Average Pain 9 Pain Right Now 9 My pain is sharp  In the last 24 hours, has pain interfered with the following? General activity 8 Relation with others 8 Enjoyment of life 8 What TIME of day is your pain at its worst? night Sleep (in general) Poor  Pain is worse with: walking and bending Pain improves with: rest, heat/ice and medication Relief from Meds: 8  Mobility walk without assistance how many minutes can you walk? 30 ability to climb steps?  yes do you drive?  yes  Function retired  Neuro/Psych numbness  Prior Studies Any changes since last visit?  no  Physicians involved in your care Any changes since last visit?  no   Family History  Problem Relation Age of Onset  . Cancer Mother     COLON  . Stroke Father    History   Social History  . Marital Status: Widowed    Spouse Name: N/A    Number of Children: N/A  . Years of Education: N/A   Social History Main Topics  . Smoking status: Never Smoker   . Smokeless tobacco: Never Used  . Alcohol Use: No  . Drug Use: No  . Sexual Activity: None   Other Topics Concern  . None   Social History Narrative  . None   Past Surgical History  Procedure Laterality Date  . Abdominal hysterectomy    . Colon surgery    . Colonoscopy  05/27/2011    Procedure: COLONOSCOPY;   Surgeon: Juanita Craver, MD;  Location: [facility];  Service: Endoscopy;  Laterality: N/A;   Past Medical History  Diagnosis Date  . CHF (congestive heart failure)   . Hypertension   . Colon polyps   . Arthritis   . Cardiomyopathy   . Hyperlipidemia   . Insomnia   . Internal hemorrhoids   . Cecal neoplasm   . Diverticulosis    BP 157/83  Pulse 69  Resp 14  Wt 130 lb (58.968 kg)  SpO2 98%  Opioid Risk Score:   Fall Risk Score: Moderate Fall Risk (6-13 points) (previously educated and given handout for fall prevention in the home (RN visit()  Review of Systems  Constitutional: Positive for diaphoresis.  Gastrointestinal: Positive for constipation.  All other systems reviewed and are negative.      Objective:   Physical Exam  Nursing note and vitals reviewed. Constitutional: She is oriented to person, place, and time. She appears well-developed and well-nourished.  HENT:  Head: Normocephalic and atraumatic.  Neck: Normal range of motion. Neck supple.  Cardiovascular: Normal rate and regular rhythm.   Pulmonary/Chest: Effort normal and breath sounds normal.  Musculoskeletal:  Normal Muscle Bulk and Muscle Testing Reveals: Upper Extremities Full ROM and Muscle Strength 5/5 Spine Forward Flexion 90 Degrees Lumbar Scoliosis Thoracic Hypersensitivity  T-12 Lower Extremities: Bilateral Knee Brace Intact/ Bilateral Flexion Produces Pain into Patella/ No Swelling or Tenderness.  Arises from chair with ease Narrow based gait   Neurological: She is alert and oriented to person, place, and time.  Skin: Skin is warm and dry.  Psychiatric: She has a normal mood and affect.          Assessment & Plan:  1. Chronic low back pain with scoliosis and sacroiliac dysfunction. Refilled: HYDROcodone: 7.5/325mg  one tablet by mouth BID #60.  2. Impingement of Left 12th rib on iliac crest: Continue to avoid left lateral bending. Contonue to monitor. 3. Bilateral Knee OA: Wears Knee  Brace. No Complaints/Continue to Monitor.  15 minutes of face to face patient care time was spent during this visit. All questions were encouraged and answered.   F/U in 1 month

## 2013-07-13 DIAGNOSIS — H35059 Retinal neovascularization, unspecified, unspecified eye: Secondary | ICD-10-CM | POA: Diagnosis not present

## 2013-07-13 DIAGNOSIS — H353290 Exudative age-related macular degeneration, unspecified eye, stage unspecified: Secondary | ICD-10-CM | POA: Diagnosis not present

## 2013-07-26 DIAGNOSIS — IMO0001 Reserved for inherently not codable concepts without codable children: Secondary | ICD-10-CM | POA: Diagnosis not present

## 2013-07-26 DIAGNOSIS — R7309 Other abnormal glucose: Secondary | ICD-10-CM | POA: Diagnosis not present

## 2013-07-26 DIAGNOSIS — I428 Other cardiomyopathies: Secondary | ICD-10-CM | POA: Diagnosis not present

## 2013-07-26 DIAGNOSIS — M159 Polyosteoarthritis, unspecified: Secondary | ICD-10-CM | POA: Diagnosis not present

## 2013-07-26 DIAGNOSIS — I10 Essential (primary) hypertension: Secondary | ICD-10-CM | POA: Diagnosis not present

## 2013-07-26 DIAGNOSIS — I509 Heart failure, unspecified: Secondary | ICD-10-CM | POA: Diagnosis not present

## 2013-07-26 DIAGNOSIS — E785 Hyperlipidemia, unspecified: Secondary | ICD-10-CM | POA: Diagnosis not present

## 2013-08-02 ENCOUNTER — Encounter: Payer: Medicare Other | Attending: Physical Medicine & Rehabilitation | Admitting: Registered Nurse

## 2013-08-02 ENCOUNTER — Encounter: Payer: Self-pay | Admitting: Registered Nurse

## 2013-08-02 VITALS — BP 144/76 | HR 78 | Resp 14 | Ht 62.0 in | Wt 134.0 lb

## 2013-08-02 DIAGNOSIS — M4120 Other idiopathic scoliosis, site unspecified: Secondary | ICD-10-CM | POA: Diagnosis not present

## 2013-08-02 DIAGNOSIS — Z760 Encounter for issue of repeat prescription: Secondary | ICD-10-CM | POA: Insufficient documentation

## 2013-08-02 DIAGNOSIS — IMO0002 Reserved for concepts with insufficient information to code with codable children: Secondary | ICD-10-CM | POA: Diagnosis not present

## 2013-08-02 DIAGNOSIS — Z5181 Encounter for therapeutic drug level monitoring: Secondary | ICD-10-CM

## 2013-08-02 DIAGNOSIS — M129 Arthropathy, unspecified: Secondary | ICD-10-CM | POA: Diagnosis not present

## 2013-08-02 DIAGNOSIS — Z79899 Other long term (current) drug therapy: Secondary | ICD-10-CM | POA: Diagnosis not present

## 2013-08-02 DIAGNOSIS — M1711 Unilateral primary osteoarthritis, right knee: Secondary | ICD-10-CM

## 2013-08-02 DIAGNOSIS — IMO0001 Reserved for inherently not codable concepts without codable children: Secondary | ICD-10-CM | POA: Insufficient documentation

## 2013-08-02 DIAGNOSIS — M419 Scoliosis, unspecified: Secondary | ICD-10-CM

## 2013-08-02 DIAGNOSIS — M1710 Unilateral primary osteoarthritis, unspecified knee: Secondary | ICD-10-CM

## 2013-08-02 MED ORDER — HYDROCODONE-ACETAMINOPHEN 7.5-325 MG PO TABS: 1.0000 | ORAL_TABLET | Freq: Two times a day (BID) | ORAL | Status: DC

## 2013-08-02 NOTE — Progress Notes (Signed)
Subjective:    Patient ID: Mariah Aguilar, female    DOB: Nov 28, 1922, 80 y.o.   MRN: [id number]  HPI: Mariah Aguilar is a 80 year old female who returns for follow up for chronic pain and medication refill. She says her pain is located on her middle and lower back. She rates her pain 6. Her current exercise regime is is using stationary bicycle, and treadmill, also performing stretching exercises. She exercises 6 days a week.  She is tearful this visit, her sister passed away on May 04, 2022 08/21/2013 with Pancreatic Cancer she was 80 years old. Emotional Support given.  Pain Inventory Average Pain 8 Pain Right Now 6 My pain is sharp and aching  In the last 24 hours, has pain interfered with the following? General activity 6 Relation with others 6 Enjoyment of life 7 What TIME of day is your pain at its worst? night Sleep (in general) Fair  Pain is worse with: walking and some activites Pain improves with: rest, heat/ice, therapy/exercise and medication Relief from Meds: 6  Mobility walk without assistance how many minutes can you walk? 30 ability to climb steps?  yes do you drive?  yes transfers alone Do you have any goals in this area?  yes  Function retired I need assistance with the following:  household duties and shopping Do you have any goals in this area?  yes  Neuro/Psych dizziness depression anxiety  Prior Studies Any changes since last visit?  no  Physicians involved in your care Any changes since last visit?  no   Family History  Problem Relation Age of Onset  . Cancer Mother     COLON  . Stroke Father    History   Social History  . Marital Status: Widowed    Spouse Name: N/A    Number of Children: N/A  . Years of Education: N/A   Social History Main Topics  . Smoking status: Never Smoker   . Smokeless tobacco: Never Used  . Alcohol Use: No  . Drug Use: No  . Sexual Activity: None   Other Topics Concern  . None   Social  History Narrative  . None   Past Surgical History  Procedure Laterality Date  . Abdominal hysterectomy    . Colon surgery    . Colonoscopy  05/27/2011    Procedure: COLONOSCOPY;  Surgeon: Juanita Craver, MD;  Location: [facility];  Service: Endoscopy;  Laterality: N/A;   Past Medical History  Diagnosis Date  . CHF (congestive heart failure)   . Hypertension   . Colon polyps   . Arthritis   . Cardiomyopathy   . Hyperlipidemia   . Insomnia   . Internal hemorrhoids   . Cecal neoplasm   . Diverticulosis    BP 144/76  Pulse 78  Resp 14  Ht 5\' 2"  (1.575 m)  Wt 134 lb (60.782 kg)  BMI 24.50 kg/m2  SpO2 99%  Opioid Risk Score:   Fall Risk Score: Moderate Fall Risk (6-13 points) (pt educated on fall risk, brochure given to pt previously)    Review of Systems  Musculoskeletal: Positive for back pain.  Neurological: Positive for dizziness.  Psychiatric/Behavioral: The patient is nervous/anxious.        Depression  All other systems reviewed and are negative.      Objective:   Physical Exam  Nursing note and vitals reviewed. Constitutional: She is oriented to person, place, and time. She appears well-developed and well-nourished.  HENT:  Head: Normocephalic and atraumatic.  Neck: Normal range of motion. Neck supple.  Cardiovascular: Normal rate and regular rhythm.   Pulmonary/Chest: Effort normal and breath sounds normal.  Musculoskeletal:  Normal Muscle Bulk and Muscle testing Reveals: Upper Extremities: Full ROM and Muscle strength 5/5 Spinal Forward Flexion: 80 Degrees and extension 20 Degrees Thoracic and Lumbar Hypersensitivity Lower Extremities: Full ROM and Muscle Strength 5/5. Bilateral Knee Brace Intact Bilateral Flexion Produces Pain into Popliteal Fossa Arises from chair with ease Narrow Based Gait  Neurological: She is alert and oriented to person, place, and time.  Skin: Skin is warm and dry.  Psychiatric: She has a normal mood and affect.           Assessment & Plan:  1. Chronic low back pain with scoliosis and sacroiliac dysfunction.  Refilled: HYDROcodone: 7.5/325mg  one tablet by mouth BID #60.  2. Impingement of Left 12th rib on iliac crest: Continue to avoid left lateral bending. Contonue to monitor.  3. Bilateral Knee OA: Wears Knee Brace's. No Complaints/Continue to Monitor.   15 minutes of face to face patient care time was spent during this visit. All questions were encouraged and answered.  F/U in 1 month

## 2013-08-04 DIAGNOSIS — H35059 Retinal neovascularization, unspecified, unspecified eye: Secondary | ICD-10-CM | POA: Diagnosis not present

## 2013-08-04 DIAGNOSIS — H353290 Exudative age-related macular degeneration, unspecified eye, stage unspecified: Secondary | ICD-10-CM | POA: Diagnosis not present

## 2013-08-18 DIAGNOSIS — H35359 Cystoid macular degeneration, unspecified eye: Secondary | ICD-10-CM | POA: Diagnosis not present

## 2013-08-18 DIAGNOSIS — H35059 Retinal neovascularization, unspecified, unspecified eye: Secondary | ICD-10-CM | POA: Diagnosis not present

## 2013-08-18 DIAGNOSIS — H353290 Exudative age-related macular degeneration, unspecified eye, stage unspecified: Secondary | ICD-10-CM | POA: Diagnosis not present

## 2013-08-31 ENCOUNTER — Encounter: Payer: Medicare Other | Attending: Physical Medicine & Rehabilitation | Admitting: Registered Nurse

## 2013-08-31 ENCOUNTER — Encounter: Payer: Self-pay | Admitting: Registered Nurse

## 2013-08-31 VITALS — BP 159/81 | HR 73 | Resp 14 | Ht 62.0 in | Wt 131.0 lb

## 2013-08-31 DIAGNOSIS — M1711 Unilateral primary osteoarthritis, right knee: Secondary | ICD-10-CM

## 2013-08-31 DIAGNOSIS — M4120 Other idiopathic scoliosis, site unspecified: Secondary | ICD-10-CM | POA: Diagnosis not present

## 2013-08-31 DIAGNOSIS — Z79899 Other long term (current) drug therapy: Secondary | ICD-10-CM | POA: Diagnosis not present

## 2013-08-31 DIAGNOSIS — M419 Scoliosis, unspecified: Secondary | ICD-10-CM

## 2013-08-31 DIAGNOSIS — Z5181 Encounter for therapeutic drug level monitoring: Secondary | ICD-10-CM | POA: Diagnosis not present

## 2013-08-31 DIAGNOSIS — IMO0001 Reserved for inherently not codable concepts without codable children: Secondary | ICD-10-CM | POA: Insufficient documentation

## 2013-08-31 DIAGNOSIS — M129 Arthropathy, unspecified: Secondary | ICD-10-CM | POA: Diagnosis not present

## 2013-08-31 DIAGNOSIS — M1710 Unilateral primary osteoarthritis, unspecified knee: Secondary | ICD-10-CM

## 2013-08-31 DIAGNOSIS — Z760 Encounter for issue of repeat prescription: Secondary | ICD-10-CM | POA: Insufficient documentation

## 2013-08-31 DIAGNOSIS — IMO0002 Reserved for concepts with insufficient information to code with codable children: Secondary | ICD-10-CM | POA: Diagnosis not present

## 2013-08-31 MED ORDER — HYDROCODONE-ACETAMINOPHEN 7.5-325 MG PO TABS: 1.0000 | ORAL_TABLET | Freq: Two times a day (BID) | ORAL | Status: DC

## 2013-08-31 NOTE — Progress Notes (Signed)
Subjective:    Patient ID: Mariah Aguilar, female    DOB: 07-06-1922, 80 y.o.   MRN: [id number]  HPI: [name] is a 79 year old female who returns for follow up for chronic pain and medication refill. She says her pain is located on her mid- lower back. She rates her pain 9. Her current exercise regime is is using stationary bicycle, treadmill, and performing stretching exercises. She exercises 6 days a week.   Pain Inventory Average Pain 8 Pain Right Now 9 My pain is constant, dull, stabbing and tingling  In the last 24 hours, has pain interfered with the following? General activity 7 Relation with others 7 Enjoyment of life 8 What TIME of day is your pain at its worst? evening, night Sleep (in general) Poor  Pain is worse with: walking and bending Pain improves with: rest, heat/ice and medication Relief from Meds: 5  Mobility walk without assistance how many minutes can you walk? 30 ability to climb steps?  yes do you drive?  yes  Function retired  Neuro/Psych weakness numbness tingling  Prior Studies Any changes since last visit?  no  Physicians involved in your care Any changes since last visit?  no   Family History  Problem Relation Age of Onset  . Cancer Mother     COLON  . Stroke Father    History   Social History  . Marital Status: Widowed    Spouse Name: N/A    Number of Children: N/A  . Years of Education: N/A   Social History Main Topics  . Smoking status: Never Smoker   . Smokeless tobacco: Never Used  . Alcohol Use: No  . Drug Use: No  . Sexual Activity: None   Other Topics Concern  . None   Social History Narrative  . None   Past Surgical History  Procedure Laterality Date  . Abdominal hysterectomy    . Colon surgery    . Colonoscopy  05/27/2011    Procedure: COLONOSCOPY;  Surgeon: Juanita Craver, MD;  Location: [facility];  Service: Endoscopy;  Laterality: N/A;   Past Medical History  Diagnosis Date  . CHF  (congestive heart failure)   . Hypertension   . Colon polyps   . Arthritis   . Cardiomyopathy   . Hyperlipidemia   . Insomnia   . Internal hemorrhoids   . Cecal neoplasm   . Diverticulosis    BP 159/81  Pulse 73  Resp 14  Ht 5\' 2"  (1.575 m)  Wt 131 lb (59.421 kg)  BMI 23.95 kg/m2  SpO2 99%  Opioid Risk Score:   Fall Risk Score: Moderate Fall Risk (6-13 points) (pt educated, declined handout)    Review of Systems  Constitutional: Positive for diaphoresis.  Gastrointestinal: Positive for constipation.  Musculoskeletal: Positive for back pain.  Neurological: Positive for weakness and numbness.       Tingling  All other systems reviewed and are negative.      Objective:   Physical Exam  Nursing note and vitals reviewed. Constitutional: She is oriented to person, place, and time. She appears well-developed and well-nourished.  HENT:  Head: Normocephalic and atraumatic.  Neck: Normal range of motion. Neck supple.  Cardiovascular: Normal rate, regular rhythm and normal heart sounds.   Pulmonary/Chest: Effort normal and breath sounds normal.  Musculoskeletal:  Normal Muscle Bulk and Muscle Testing Reveals: Upper Extremities: Full ROM and Muscle Strength 5/5 Spinal Forward Flexion 80 Degrees and extension 20 degrees.  Thoracic Paraspinal Tenderness: T-10- T-12 Lumbar Paraspinal Tenderness: L-3- L-5 Lower Extremities: Full ROM and Muscle strength 5/5 Bilateral Knee Braces Intact Arises from Chair with ease Narrow Based gait  Neurological: She is alert and oriented to person, place, and time.  Skin: Skin is warm and dry.  Psychiatric: She has a normal mood and affect.          Assessment & Plan:  1. Chronic low back pain with scoliosis and sacroiliac dysfunction.  Refilled: HYDROcodone: 7.5/325mg  one tablet by mouth BID #60.  2. Impingement of Left 12th rib on iliac crest: Continue to avoid left lateral bending. Continue to monitor.  3. Bilateral Knee OA: Wears  Knee Brace's. No Complaints/Continue to Monitor.   15 minutes of face to face patient care time was spent during this visit. All questions were encouraged and answered.   F/U in 1 month

## 2013-09-03 DIAGNOSIS — N72 Inflammatory disease of cervix uteri: Secondary | ICD-10-CM | POA: Diagnosis not present

## 2013-09-03 DIAGNOSIS — N7689 Other specified inflammation of vagina and vulva: Secondary | ICD-10-CM | POA: Diagnosis not present

## 2013-09-03 DIAGNOSIS — N8111 Cystocele, midline: Secondary | ICD-10-CM | POA: Diagnosis not present

## 2013-09-08 DIAGNOSIS — H353290 Exudative age-related macular degeneration, unspecified eye, stage unspecified: Secondary | ICD-10-CM | POA: Diagnosis not present

## 2013-09-08 DIAGNOSIS — H35359 Cystoid macular degeneration, unspecified eye: Secondary | ICD-10-CM | POA: Diagnosis not present

## 2013-09-17 DIAGNOSIS — N8111 Cystocele, midline: Secondary | ICD-10-CM | POA: Diagnosis not present

## 2013-09-22 DIAGNOSIS — H353290 Exudative age-related macular degeneration, unspecified eye, stage unspecified: Secondary | ICD-10-CM | POA: Diagnosis not present

## 2013-09-22 DIAGNOSIS — H35059 Retinal neovascularization, unspecified, unspecified eye: Secondary | ICD-10-CM | POA: Diagnosis not present

## 2013-09-28 ENCOUNTER — Encounter: Payer: Medicare Other | Attending: Physical Medicine & Rehabilitation | Admitting: Registered Nurse

## 2013-09-28 ENCOUNTER — Encounter: Payer: Self-pay | Admitting: Registered Nurse

## 2013-09-28 VITALS — BP 132/61 | HR 78 | Resp 14 | Ht 62.0 in | Wt 132.2 lb

## 2013-09-28 DIAGNOSIS — Z760 Encounter for issue of repeat prescription: Secondary | ICD-10-CM | POA: Insufficient documentation

## 2013-09-28 DIAGNOSIS — IMO0001 Reserved for inherently not codable concepts without codable children: Secondary | ICD-10-CM | POA: Insufficient documentation

## 2013-09-28 DIAGNOSIS — M4120 Other idiopathic scoliosis, site unspecified: Secondary | ICD-10-CM | POA: Diagnosis not present

## 2013-09-28 DIAGNOSIS — Z79899 Other long term (current) drug therapy: Secondary | ICD-10-CM

## 2013-09-28 DIAGNOSIS — M1710 Unilateral primary osteoarthritis, unspecified knee: Secondary | ICD-10-CM | POA: Diagnosis not present

## 2013-09-28 DIAGNOSIS — IMO0002 Reserved for concepts with insufficient information to code with codable children: Secondary | ICD-10-CM

## 2013-09-28 DIAGNOSIS — M129 Arthropathy, unspecified: Secondary | ICD-10-CM | POA: Diagnosis not present

## 2013-09-28 DIAGNOSIS — M419 Scoliosis, unspecified: Secondary | ICD-10-CM

## 2013-09-28 DIAGNOSIS — Z5181 Encounter for therapeutic drug level monitoring: Secondary | ICD-10-CM

## 2013-09-28 DIAGNOSIS — M1711 Unilateral primary osteoarthritis, right knee: Secondary | ICD-10-CM

## 2013-09-28 DIAGNOSIS — M439 Deforming dorsopathy, unspecified: Secondary | ICD-10-CM | POA: Diagnosis not present

## 2013-09-28 MED ORDER — HYDROCODONE-ACETAMINOPHEN 7.5-325 MG PO TABS: 1.0000 | ORAL_TABLET | Freq: Two times a day (BID) | ORAL | Status: DC

## 2013-09-28 NOTE — Addendum Note (Signed)
Addended by: Caro Hight on: 09/28/2013 10:27 AM   Modules accepted: Orders

## 2013-09-28 NOTE — Progress Notes (Signed)
Subjective:    Patient ID: Mariah Aguilar, female    DOB: Mar 26, 1922, 80 y.o.   MRN: [id number]  HPI: [name] is a 80 year old female who returns for follow up for chronic pain and medication refill. She says her pain is located on her mid- lower back, right buttock and bilateral knees. She rates her pain 9. Her current exercise regime is is using stationary bicycle, treadmill, and performing stretching exercises. She exercises 6 days a week.   Pain Inventory Average Pain 9 Pain Right Now 9 My pain is sharp and aching  In the last 24 hours, has pain interfered with the following? General activity 7 Relation with others 7 Enjoyment of life 7 What TIME of day is your pain at its worst? night Sleep (in general) Fair  Pain is worse with: walking and standing Pain improves with: medication Relief from Meds: 7  Mobility walk without assistance  Function retired  Neuro/Psych numbness tingling  Prior Studies Any changes since last visit?  no  Physicians involved in your care Any changes since last visit?  no   Family History  Problem Relation Age of Onset  . Cancer Mother     COLON  . Stroke Father    History   Social History  . Marital Status: Widowed    Spouse Name: N/A    Number of Children: N/A  . Years of Education: N/A   Social History Main Topics  . Smoking status: Never Smoker   . Smokeless tobacco: Never Used  . Alcohol Use: No  . Drug Use: No  . Sexual Activity: None   Other Topics Concern  . None   Social History Narrative  . None   Past Surgical History  Procedure Laterality Date  . Abdominal hysterectomy    . Colon surgery    . Colonoscopy  05/27/2011    Procedure: COLONOSCOPY;  Surgeon: Juanita Craver, MD;  Location: [facility];  Service: Endoscopy;  Laterality: N/A;   Past Medical History  Diagnosis Date  . CHF (congestive heart failure)   . Hypertension   . Colon polyps   . Arthritis   . Cardiomyopathy   .  Hyperlipidemia   . Insomnia   . Internal hemorrhoids   . Cecal neoplasm   . Diverticulosis    BP 132/61  Pulse 78  Resp 14  Ht 5\' 2"  (1.575 m)  Wt 132 lb 3.2 oz (59.966 kg)  BMI 24.17 kg/m2  SpO2 100%  Opioid Risk Score:   Fall Risk Score: Moderate Fall Risk (6-13 points) (previoulsy educated and given handout) Review of Systems  Constitutional: Positive for diaphoresis.  Gastrointestinal: Positive for constipation.  Neurological: Positive for numbness.       Tingling  All other systems reviewed and are negative.      Objective:   Physical Exam  Nursing note and vitals reviewed. Constitutional: She is oriented to person, place, and time. She appears well-developed and well-nourished.  HENT:  Head: Normocephalic and atraumatic.  Neck: Normal range of motion. Neck supple.  Cardiovascular: Normal rate and regular rhythm.   Pulmonary/Chest: Effort normal and breath sounds normal.  Musculoskeletal:  Normal Muscle Bulk and Muscle testing Reveals: Upper extremities: Full ROM and Muscle strength 5/5 Spinal Forward Flexion 80 Degrees and extension 20 Degrees Thoracic Paraspinal tenderness: T-6- T-8 Right Gluteal Maximus Tenderness Lower Extremities: Full ROM and Muscle Strength 5/5 Bilateral Lower extremities Flexion Produces Painto into Patella. Bilateral Knee Brace Intact Arises from  chair with ease Narrow Based gait  Neurological: She is alert and oriented to person, place, and time.  Skin: Skin is warm and dry.  Psychiatric: She has a normal mood and affect.          Assessment & Plan:  1. Chronic low back pain with scoliosis and sacroiliac dysfunction.  Refilled: HYDROcodone: 7.5/325mg  one tablet by mouth BID #60.  2. Impingement of Left 12th rib on iliac crest: Continue to avoid left lateral bending. Continue to monitor.  3. Bilateral Knee OA: Wears Knee Brace's. No Complaints/Continue to Monitor.   15 minutes of face to face patient care time was spent during  this visit. All questions were encouraged and answered.   F/U in 1 month

## 2013-10-15 DIAGNOSIS — N8111 Cystocele, midline: Secondary | ICD-10-CM | POA: Diagnosis not present

## 2013-10-15 DIAGNOSIS — Z23 Encounter for immunization: Secondary | ICD-10-CM | POA: Diagnosis not present

## 2013-10-20 DIAGNOSIS — H3532 Exudative age-related macular degeneration: Secondary | ICD-10-CM | POA: Diagnosis not present

## 2013-10-20 DIAGNOSIS — H35051 Retinal neovascularization, unspecified, right eye: Secondary | ICD-10-CM | POA: Diagnosis not present

## 2013-10-20 NOTE — Progress Notes (Signed)
Agree with count plus UDS at next visit. If inconsistent, no hydrocodone in urine as well as off on pill count then would need to dis charge

## 2013-10-25 DIAGNOSIS — I420 Dilated cardiomyopathy: Secondary | ICD-10-CM | POA: Diagnosis not present

## 2013-10-25 DIAGNOSIS — M1990 Unspecified osteoarthritis, unspecified site: Secondary | ICD-10-CM | POA: Diagnosis not present

## 2013-10-25 DIAGNOSIS — E785 Hyperlipidemia, unspecified: Secondary | ICD-10-CM | POA: Diagnosis not present

## 2013-10-25 DIAGNOSIS — I5020 Unspecified systolic (congestive) heart failure: Secondary | ICD-10-CM | POA: Diagnosis not present

## 2013-10-25 DIAGNOSIS — I10 Essential (primary) hypertension: Secondary | ICD-10-CM | POA: Diagnosis not present

## 2013-10-26 ENCOUNTER — Encounter: Payer: Medicare Other | Attending: Physical Medicine & Rehabilitation | Admitting: Registered Nurse

## 2013-10-26 ENCOUNTER — Encounter: Payer: Self-pay | Admitting: Registered Nurse

## 2013-10-26 VITALS — BP 132/69 | HR 90 | Resp 14 | Wt 131.4 lb

## 2013-10-26 DIAGNOSIS — Z5181 Encounter for therapeutic drug level monitoring: Secondary | ICD-10-CM | POA: Insufficient documentation

## 2013-10-26 DIAGNOSIS — M179 Osteoarthritis of knee, unspecified: Secondary | ICD-10-CM

## 2013-10-26 DIAGNOSIS — Z79899 Other long term (current) drug therapy: Secondary | ICD-10-CM | POA: Diagnosis not present

## 2013-10-26 DIAGNOSIS — M4126 Other idiopathic scoliosis, lumbar region: Secondary | ICD-10-CM | POA: Diagnosis not present

## 2013-10-26 DIAGNOSIS — M1711 Unilateral primary osteoarthritis, right knee: Secondary | ICD-10-CM

## 2013-10-26 DIAGNOSIS — M4124 Other idiopathic scoliosis, thoracic region: Secondary | ICD-10-CM

## 2013-10-26 DIAGNOSIS — M419 Scoliosis, unspecified: Secondary | ICD-10-CM

## 2013-10-26 MED ORDER — HYDROCODONE-ACETAMINOPHEN 7.5-325 MG PO TABS: 1.0000 | ORAL_TABLET | Freq: Two times a day (BID) | ORAL | Status: DC

## 2013-10-26 NOTE — Progress Notes (Signed)
Subjective:    Patient ID: Mariah Aguilar, female    DOB: November 23, 1922, 80 y.o.   MRN: [id number]  HPI: [name] is a 80 year old female who returns for follow up for chronic pain and medication refill. She says her pain is located on her mid- lower back.  She rates her pain 7. Her current exercise regime is is using stationary bicycle, treadmill, and performing stretching exercises. She exercises 6 days a week.  Spoke to [name] regarding her UDS being inconsistent with no evidence of hydrocodone. She states" she is taking the medication as prescribed". She should have 3 tablets left. Her prescription was filled on 09/28/2013, she has no pills in the bottle. Her UDS will be repeated today, if inconsistent she will be discharged from pain clinic.   Pain Inventory Average Pain 8 Pain Right Now 7 My pain is constant, sharp and aching  In the last 24 hours, has pain interfered with the following? General activity 7 Relation with others 7 Enjoyment of life 8 What TIME of day is your pain at its worst? night Sleep (in general) Poor  Pain is worse with: walking and standing Pain improves with: heat/ice, therapy/exercise and medication Relief from Meds: 5  Mobility walk without assistance how many minutes can you walk? 30 minutes ability to climb steps?  yes do you drive?  yes  Function retired  Neuro/Psych weakness numbness depression anxiety  Prior Studies Any changes since last visit?  yes  Physicians involved in your care Any changes since last visit?  yes   Family History  Problem Relation Age of Onset  . Cancer Mother     COLON  . Stroke Father    History   Social History  . Marital Status: Widowed    Spouse Name: N/A    Number of Children: N/A  . Years of Education: N/A   Social History Main Topics  . Smoking status: Never Smoker   . Smokeless tobacco: Never Used  . Alcohol Use: No  . Drug Use: No  . Sexual Activity: None   Other  Topics Concern  . None   Social History Narrative  . None   Past Surgical History  Procedure Laterality Date  . Abdominal hysterectomy    . Colon surgery    . Colonoscopy  05/27/2011    Procedure: COLONOSCOPY;  Surgeon: Juanita Craver, MD;  Location: [facility];  Service: Endoscopy;  Laterality: N/A;   Past Medical History  Diagnosis Date  . CHF (congestive heart failure)   . Hypertension   . Colon polyps   . Arthritis   . Cardiomyopathy   . Hyperlipidemia   . Insomnia   . Internal hemorrhoids   . Cecal neoplasm   . Diverticulosis    BP 132/69  Pulse 90  Resp 14  Wt 131 lb 6.4 oz (59.603 kg)  SpO2 99%  Opioid Risk Score:   Fall Risk Score:    Review of Systems     Objective:   Physical Exam  Nursing note and vitals reviewed. Constitutional: She is oriented to person, place, and time. She appears well-developed and well-nourished.  HENT:  Head: Normocephalic and atraumatic.  Neck: Normal range of motion. Neck supple.  Cardiovascular: Normal rate and regular rhythm.   Pulmonary/Chest: Effort normal and breath sounds normal.  Musculoskeletal:  Normal Muscle Bulk and Muscle Testing Reveals: Upper Extremities: Full ROM and Muscle Strength 5/5 Thoracic Paraspinal Tenderness: T-10- T-12 Lumbar Paraspinal Tenderness: L-3-  L-5 Lower Extremities: Full ROM and Muscle Strength 5/5 Bilateral Knee Braces Intact Arises from chair with ease Narrow based Gait  Neurological: She is alert and oriented to person, place, and time.  Skin: Skin is warm and dry.          Assessment & Plan:  1. Chronic low back pain with scoliosis and sacroiliac dysfunction.  Refilled: HYDROcodone: 7.5/325mg  one tablet by mouth BID #60.  2. Impingement of Left 12th rib on iliac crest: Continue to avoid left lateral bending. Continue to monitor.  3. Bilateral Knee OA: Wears Knee Brace's. No Complaints/Continue to Monitor.  15 minutes of face to face patient care time was spent during this  visit. All questions were encouraged and answered.  F/U in 1 month

## 2013-10-26 NOTE — Addendum Note (Signed)
Addended by: Valeria Batman on: 10/26/2013 01:41 PM   Modules accepted: Orders

## 2013-11-02 DIAGNOSIS — H35052 Retinal neovascularization, unspecified, left eye: Secondary | ICD-10-CM | POA: Diagnosis not present

## 2013-11-02 DIAGNOSIS — H3532 Exudative age-related macular degeneration: Secondary | ICD-10-CM | POA: Diagnosis not present

## 2013-11-08 ENCOUNTER — Encounter: Payer: Self-pay | Admitting: Registered Nurse

## 2013-11-12 DIAGNOSIS — N765 Ulceration of vagina: Secondary | ICD-10-CM | POA: Diagnosis not present

## 2013-11-12 DIAGNOSIS — N8111 Cystocele, midline: Secondary | ICD-10-CM | POA: Diagnosis not present

## 2013-11-23 ENCOUNTER — Encounter: Payer: Self-pay | Admitting: Registered Nurse

## 2013-11-23 ENCOUNTER — Encounter: Payer: Medicare Other | Attending: Physical Medicine & Rehabilitation | Admitting: Registered Nurse

## 2013-11-23 VITALS — BP 155/77 | HR 70 | Resp 14 | Ht 62.0 in | Wt 132.0 lb

## 2013-11-23 DIAGNOSIS — Z79899 Other long term (current) drug therapy: Secondary | ICD-10-CM | POA: Insufficient documentation

## 2013-11-23 DIAGNOSIS — Z5181 Encounter for therapeutic drug level monitoring: Secondary | ICD-10-CM

## 2013-11-23 DIAGNOSIS — M4124 Other idiopathic scoliosis, thoracic region: Secondary | ICD-10-CM | POA: Diagnosis not present

## 2013-11-23 DIAGNOSIS — M4126 Other idiopathic scoliosis, lumbar region: Secondary | ICD-10-CM | POA: Diagnosis not present

## 2013-11-23 DIAGNOSIS — M419 Scoliosis, unspecified: Secondary | ICD-10-CM

## 2013-11-23 DIAGNOSIS — M179 Osteoarthritis of knee, unspecified: Secondary | ICD-10-CM

## 2013-11-23 DIAGNOSIS — G609 Hereditary and idiopathic neuropathy, unspecified: Secondary | ICD-10-CM | POA: Diagnosis not present

## 2013-11-23 DIAGNOSIS — M1711 Unilateral primary osteoarthritis, right knee: Secondary | ICD-10-CM

## 2013-11-23 MED ORDER — GABAPENTIN 100 MG PO CAPS: 100.0000 mg | ORAL_CAPSULE | Freq: Three times a day (TID) | ORAL | Status: DC

## 2013-11-23 MED ORDER — HYDROCODONE-ACETAMINOPHEN 7.5-325 MG PO TABS: 1.0000 | ORAL_TABLET | Freq: Two times a day (BID) | ORAL | Status: DC

## 2013-11-23 NOTE — Progress Notes (Signed)
Subjective:    Patient ID: Mariah Aguilar, female    DOB: 18-Apr-1922, 80 y.o.   MRN: [id number]  HPI: [name] is a 80 year old female who returns for follow up for chronic pain and medication refill. She says her pain is located on her mid- lower back.She states" she's having tingling and burning sensation in her lower extremities. It has become very intense inhibiting her from sleeping. We will prescribe Gabapentin. She rates her pain 6. Her current exercise regime is is using stationary bicycle, treadmill, and performing stretching exercises. She exercises 6 days a week   Pain Inventory Average Pain 8 Pain Right Now 6 My pain is constant, sharp, tingling and aching  In the last 24 hours, has pain interfered with the following? General activity 7 Relation with others 6 Enjoyment of life 8 What TIME of day is your pain at its worst? night Sleep (in general) Poor  Pain is worse with: walking and standing Pain improves with: heat/ice, therapy/exercise and medication Relief from Meds: 3  Mobility walk without assistance how many minutes can you walk? 30 ability to climb steps?  yes do you drive?  yes Do you have any goals in this area?  yes  Function retired I need assistance with the following:  household duties Do you have any goals in this area?  no  Neuro/Psych weakness numbness tingling anxiety  Prior Studies Any changes since last visit?  no  Physicians involved in your care Any changes since last visit?  no   Family History  Problem Relation Age of Onset  . Cancer Mother     COLON  . Stroke Father    History   Social History  . Marital Status: Widowed    Spouse Name: N/A    Number of Children: N/A  . Years of Education: N/A   Social History Main Topics  . Smoking status: Never Smoker   . Smokeless tobacco: Never Used  . Alcohol Use: No  . Drug Use: No  . Sexual Activity: None   Other Topics Concern  . None   Social  History Narrative   Past Surgical History  Procedure Laterality Date  . Abdominal hysterectomy    . Colon surgery    . Colonoscopy  05/27/2011    Procedure: COLONOSCOPY;  Surgeon: Juanita Craver, MD;  Location: [facility];  Service: Endoscopy;  Laterality: N/A;   Past Medical History  Diagnosis Date  . CHF (congestive heart failure)   . Hypertension   . Colon polyps   . Arthritis   . Cardiomyopathy   . Hyperlipidemia   . Insomnia   . Internal hemorrhoids   . Cecal neoplasm   . Diverticulosis    BP 155/77 mmHg  Pulse 70  Resp 14  Ht 5\' 2"  (1.575 m)  Wt 132 lb (59.875 kg)  BMI 24.14 kg/m2  SpO2 97%  Opioid Risk Score:   Fall Risk Score: Moderate Fall Risk (6-13 points) Review of Systems  Neurological: Positive for weakness and numbness.       Tingling  Psychiatric/Behavioral: The patient is nervous/anxious.   All other systems reviewed and are negative.      Objective:   Physical Exam  Constitutional: She is oriented to person, place, and time. She appears well-developed and well-nourished.  HENT:  Head: Normocephalic and atraumatic.  Neck: Normal range of motion. Neck supple.  Cardiovascular: Normal rate and regular rhythm.   Pulmonary/Chest: Effort normal and breath sounds normal.  Musculoskeletal:  Normal Muscle Bulk and Muscle testing reveals: Upper extremities: Full ROM and Muscle Strength 5/5 Thoracic and Lumbar Hypersensitivity Lower Extremities: Full ROM and Muscle strength 5/5 Bilateral Knee Braces Intact Arises from chair with ease Narrow Based gait  Neurological: She is alert and oriented to person, place, and time.  Skin: Skin is warm and dry.  Psychiatric: She has a normal mood and affect.  Nursing note and vitals reviewed.         Assessment & Plan:  1. Chronic low back pain with scoliosis and sacroiliac dysfunction.  Refilled: HYDROcodone: 7.5/325mg  one tablet by mouth BID #60.  2. Impingement of Left 12th rib on iliac crest: Continue  to avoid left lateral bending. Continue to monitor.  3. Bilateral Knee OA: Wears Knee Brace's. No Complaints/Continue to Monitor.  4. Peripheral Neuropathy: RX: Gabapentin: Follow Instructions  15 minutes of face to face patient care time was spent during this visit. All questions were encouraged and answered.  F/U in 1 month

## 2013-11-23 NOTE — Patient Instructions (Signed)
Start Gabapentin Today:  Gabapentin 100 mg Capsule: One at Bedtime every night for a week  If Numbness, Tingling, and Burning Persists: Take one Capsule Twice a Day for a week  If Pain Persistes  Take one Capsule three times a Day  Any Questions call the office

## 2013-11-29 DIAGNOSIS — N8111 Cystocele, midline: Secondary | ICD-10-CM | POA: Diagnosis not present

## 2013-12-07 DIAGNOSIS — M1990 Unspecified osteoarthritis, unspecified site: Secondary | ICD-10-CM | POA: Diagnosis not present

## 2013-12-07 DIAGNOSIS — I10 Essential (primary) hypertension: Secondary | ICD-10-CM | POA: Diagnosis not present

## 2013-12-07 DIAGNOSIS — I5020 Unspecified systolic (congestive) heart failure: Secondary | ICD-10-CM | POA: Diagnosis not present

## 2013-12-07 DIAGNOSIS — R7309 Other abnormal glucose: Secondary | ICD-10-CM | POA: Diagnosis not present

## 2013-12-07 DIAGNOSIS — E785 Hyperlipidemia, unspecified: Secondary | ICD-10-CM | POA: Diagnosis not present

## 2013-12-07 DIAGNOSIS — I420 Dilated cardiomyopathy: Secondary | ICD-10-CM | POA: Diagnosis not present

## 2013-12-07 DIAGNOSIS — J029 Acute pharyngitis, unspecified: Secondary | ICD-10-CM | POA: Diagnosis not present

## 2013-12-14 DIAGNOSIS — H35053 Retinal neovascularization, unspecified, bilateral: Secondary | ICD-10-CM | POA: Diagnosis not present

## 2013-12-14 DIAGNOSIS — H3532 Exudative age-related macular degeneration: Secondary | ICD-10-CM | POA: Diagnosis not present

## 2013-12-21 ENCOUNTER — Encounter: Payer: Medicare Other | Attending: Physical Medicine & Rehabilitation | Admitting: Registered Nurse

## 2013-12-21 ENCOUNTER — Encounter: Payer: Self-pay | Admitting: Registered Nurse

## 2013-12-21 VITALS — BP 137/72 | HR 69 | Resp 14 | Ht 62.0 in | Wt 128.0 lb

## 2013-12-21 DIAGNOSIS — M4126 Other idiopathic scoliosis, lumbar region: Secondary | ICD-10-CM | POA: Diagnosis not present

## 2013-12-21 DIAGNOSIS — Z79899 Other long term (current) drug therapy: Secondary | ICD-10-CM | POA: Insufficient documentation

## 2013-12-21 DIAGNOSIS — Z5181 Encounter for therapeutic drug level monitoring: Secondary | ICD-10-CM | POA: Insufficient documentation

## 2013-12-21 DIAGNOSIS — M419 Scoliosis, unspecified: Secondary | ICD-10-CM

## 2013-12-21 DIAGNOSIS — M4124 Other idiopathic scoliosis, thoracic region: Secondary | ICD-10-CM | POA: Diagnosis not present

## 2013-12-21 MED ORDER — HYDROCODONE-ACETAMINOPHEN 7.5-325 MG PO TABS: 1.0000 | ORAL_TABLET | Freq: Two times a day (BID) | ORAL | Status: DC

## 2013-12-21 NOTE — Addendum Note (Signed)
Addended by: Geryl Rankins D on: 12/21/2013 02:04 PM   Modules accepted: Orders

## 2013-12-21 NOTE — Progress Notes (Signed)
Subjective:    Patient ID: Mariah Aguilar, female    DOB: November 03, 1922, 80 y.o.   MRN: [id number]  HPI: [name] is a 80 year old female who returns for follow up for chronic pain and medication refill. She says her pain is located in her lower back.She rates her pain 7. Her current exercise regime is is using stationary bicycle, treadmill, and performing stretching exercises. She exercises 6 days a week  Pain Inventory Average Pain 8 Pain Right Now 7 My pain is constant, sharp, burning, dull, stabbing, tingling and aching  In the last 24 hours, has pain interfered with the following? General activity 8 Relation with others 7 Enjoyment of life 8 What TIME of day is your pain at its worst? evening, night Sleep (in general) Poor  Pain is worse with: bending and standing Pain improves with: rest, heat/ice and medication Relief from Meds: 5  Mobility walk without assistance walk with assistance how many minutes can you walk? 30 ability to climb steps?  yes do you drive?  yes Do you have any goals in this area?  yes  Function retired  Neuro/Psych weakness numbness tingling spasms  Prior Studies Any changes since last visit?  no  Physicians involved in your care Any changes since last visit?  no   Family History  Problem Relation Age of Onset  . Cancer Mother     COLON  . Stroke Father    History   Social History  . Marital Status: Widowed    Spouse Name: N/A    Number of Children: N/A  . Years of Education: N/A   Social History Main Topics  . Smoking status: Never Smoker   . Smokeless tobacco: Never Used  . Alcohol Use: No  . Drug Use: No  . Sexual Activity: None   Other Topics Concern  . None   Social History Narrative   Past Surgical History  Procedure Laterality Date  . Abdominal hysterectomy    . Colon surgery    . Colonoscopy  05/27/2011    Procedure: COLONOSCOPY;  Surgeon: Juanita Craver, MD;  Location: [facility];   Service: Endoscopy;  Laterality: N/A;   Past Medical History  Diagnosis Date  . CHF (congestive heart failure)   . Hypertension   . Colon polyps   . Arthritis   . Cardiomyopathy   . Hyperlipidemia   . Insomnia   . Internal hemorrhoids   . Cecal neoplasm   . Diverticulosis    BP 137/72 mmHg  Pulse 69  Resp 145  Ht 5\' 2"  (1.575 m)  Wt 128 lb (58.06 kg)  BMI 23.41 kg/m2  SpO2 99%  Opioid Risk Score:   Fall Risk Score: Moderate Fall Risk (6-13 points) (pt declined pamphlet) Review of Systems  Constitutional:       Night sweats  Neurological: Positive for weakness and numbness.       Tingling  All other systems reviewed and are negative.      Objective:   Physical Exam  Constitutional: She is oriented to person, place, and time. She appears well-developed and well-nourished.  HENT:  Head: Normocephalic and atraumatic.  Neck: Normal range of motion. Neck supple.  Cardiovascular: Normal rate and regular rhythm.   Pulmonary/Chest: Effort normal and breath sounds normal.  Musculoskeletal:  Normal Muscle Bulk and Muscle Testing Reveals: Upper Extremities: Full ROM and Muscle strength 5/5 Lumbar Paraspinal Tenderness: L-3- L-4 Lower Extremities: Full ROM and Muscle strength 5/5 Bilateral Knee  Braces Intact Arises from chair with ease Narrow Based gait    Neurological: She is alert and oriented to person, place, and time.  Skin: Skin is warm and dry.  Psychiatric: She has a normal mood and affect.  Nursing note and vitals reviewed.         Assessment & Plan:  1. Chronic low back pain with scoliosis and sacroiliac dysfunction.  Refilled: HYDROcodone: 7.5/325mg  one tablet by mouth BID #60.  2. Impingement of Left 12th rib on iliac crest: Continue to avoid left lateral bending. Continue to monitor.  3. Bilateral Knee OA: Wears Knee Brace's. No Complaints/Continue to Monitor.  4. Peripheral Neuropathy: Continue Gabapentin  15 minutes of face to face patient  care time was spent during this visit. All questions were encouraged and answered.

## 2013-12-22 ENCOUNTER — Other Ambulatory Visit: Payer: Self-pay | Admitting: Physical Medicine & Rehabilitation

## 2013-12-22 DIAGNOSIS — M4126 Other idiopathic scoliosis, lumbar region: Secondary | ICD-10-CM | POA: Diagnosis not present

## 2013-12-22 DIAGNOSIS — Z5181 Encounter for therapeutic drug level monitoring: Secondary | ICD-10-CM | POA: Diagnosis not present

## 2013-12-22 DIAGNOSIS — Z79899 Other long term (current) drug therapy: Secondary | ICD-10-CM | POA: Diagnosis not present

## 2013-12-22 DIAGNOSIS — M4124 Other idiopathic scoliosis, thoracic region: Secondary | ICD-10-CM | POA: Diagnosis not present

## 2013-12-23 LAB — PMP ALCOHOL METABOLITE (ETG): Ethyl Glucuronide (EtG): NEGATIVE ng/mL

## 2013-12-27 DIAGNOSIS — M1711 Unilateral primary osteoarthritis, right knee: Secondary | ICD-10-CM | POA: Diagnosis not present

## 2013-12-27 DIAGNOSIS — M4807 Spinal stenosis, lumbosacral region: Secondary | ICD-10-CM | POA: Diagnosis not present

## 2013-12-27 DIAGNOSIS — M1712 Unilateral primary osteoarthritis, left knee: Secondary | ICD-10-CM | POA: Diagnosis not present

## 2013-12-27 LAB — BENZODIAZEPINES (GC/LC/MS), URINE
Alprazolam metabolite (GC/LC/MS), ur confirm: NEGATIVE ng/mL (ref <25)
Clonazepam metabolite (GC/LC/MS), ur confirm: NEGATIVE ng/mL (ref <25)
Flurazepam metabolite (GC/LC/MS), ur confirm: NEGATIVE ng/mL (ref <50)
Lorazepam (GC/LC/MS), ur confirm: NEGATIVE ng/mL (ref <50)
Midazolam (GC/LC/MS), ur confirm: NEGATIVE ng/mL (ref <50)
NORDIAZEPAMU: 116 ng/mL — AB (ref <50)
OXAZEPAMU: 195 ng/mL — AB (ref <50)
TEMAZEPAMU: 289 ng/mL — AB (ref <50)
Triazolam metabolite (GC/LC/MS), ur confirm: NEGATIVE ng/mL (ref <50)

## 2013-12-27 LAB — OPIATES/OPIOIDS (LC/MS-MS)
Codeine Urine: NEGATIVE ng/mL (ref <50)
HYDROCODONE: NEGATIVE ng/mL — AB (ref <50)
Hydromorphone: NEGATIVE ng/mL — AB (ref <50)
MORPHINE: NEGATIVE ng/mL (ref <50)
Norhydrocodone, Ur: NEGATIVE ng/mL — AB (ref <50)
Noroxycodone, Ur: NEGATIVE ng/mL (ref <50)
OXYMORPHONE, URINE: NEGATIVE ng/mL (ref <50)
Oxycodone, ur: NEGATIVE ng/mL (ref <50)

## 2013-12-28 LAB — PRESCRIPTION MONITORING PROFILE (SOLSTAS)
Amphetamine/Meth: NEGATIVE ng/mL
BUPRENORPHINE, URINE: NEGATIVE ng/mL
Barbiturate Screen, Urine: NEGATIVE ng/mL
CARISOPRODOL, URINE: NEGATIVE ng/mL
COCAINE METABOLITES: NEGATIVE ng/mL
Cannabinoid Scrn, Ur: NEGATIVE ng/mL
Creatinine, Urine: 12.52 mg/dL — ABNORMAL LOW (ref >20.0)
FENTANYL URINE: NEGATIVE ng/mL
MDMA URINE: NEGATIVE ng/mL
MEPERIDINE UR: NEGATIVE ng/mL
Methadone Screen, Urine: NEGATIVE ng/mL
NITRITES URINE, INITIAL: NEGATIVE ug/mL
OXYCODONE SCRN UR: NEGATIVE ng/mL
PH URINE, INITIAL: 6.5 pH (ref 4.5–8.9)
Propoxyphene: NEGATIVE ng/mL
TRAMADOL UR: NEGATIVE ng/mL
Tapentadol, urine: NEGATIVE ng/mL
Zolpidem, Urine: NEGATIVE ng/mL

## 2014-01-04 DIAGNOSIS — H35052 Retinal neovascularization, unspecified, left eye: Secondary | ICD-10-CM | POA: Diagnosis not present

## 2014-01-04 DIAGNOSIS — H3532 Exudative age-related macular degeneration: Secondary | ICD-10-CM | POA: Diagnosis not present

## 2014-01-12 ENCOUNTER — Other Ambulatory Visit: Payer: Self-pay | Admitting: *Deleted

## 2014-01-12 NOTE — Progress Notes (Signed)
Urine drug screen for this encounter is negative for hydrocodone or metabolite even though it was taken one day before test.

## 2014-01-18 ENCOUNTER — Telehealth: Payer: Self-pay | Admitting: *Deleted

## 2014-01-18 NOTE — Telephone Encounter (Signed)
Left voice mail for Mariah Aguilar to call the office so that I may discuss her urine drug screen negative for medication and Dr Letta Pate' decision to discharge from clinic.  This has been discussed with her before when there has bee inconsistencies and so he is going forward with discharge.

## 2014-01-18 NOTE — Telephone Encounter (Signed)
Mariah Aguilar called back and I explained to her that Dr Letta Pate is discharging her from the clinic due to her medication is not showing up in her urine though she says she is taking it.  She does not understand this because she says she is taking it.  I told her that the problem is that some time it has shown up and others it does not.  This raises questions.  Valium was present on this screen but they hydrocodone was not even though she stated she took it.  She is wanting to see Dr Letta Pate but I told her I do not believe it will be possible because he has made his decision.  She says she wants to talk with Mariah Aguilar about her back tomorrow and I told her she can do that but that she cannot go against Dr Read Drivers decision and will only be able to issue the last rx.  She says she understands.

## 2014-01-19 ENCOUNTER — Encounter: Payer: Self-pay | Admitting: Registered Nurse

## 2014-01-19 ENCOUNTER — Encounter: Payer: Medicare Other | Attending: Physical Medicine & Rehabilitation | Admitting: Registered Nurse

## 2014-01-19 VITALS — BP 139/74 | HR 69 | Resp 14

## 2014-01-19 DIAGNOSIS — Z79899 Other long term (current) drug therapy: Secondary | ICD-10-CM | POA: Diagnosis present

## 2014-01-19 DIAGNOSIS — M4126 Other idiopathic scoliosis, lumbar region: Secondary | ICD-10-CM | POA: Diagnosis not present

## 2014-01-19 DIAGNOSIS — M1711 Unilateral primary osteoarthritis, right knee: Secondary | ICD-10-CM

## 2014-01-19 DIAGNOSIS — M179 Osteoarthritis of knee, unspecified: Secondary | ICD-10-CM

## 2014-01-19 DIAGNOSIS — Z5181 Encounter for therapeutic drug level monitoring: Secondary | ICD-10-CM | POA: Insufficient documentation

## 2014-01-19 DIAGNOSIS — M4124 Other idiopathic scoliosis, thoracic region: Secondary | ICD-10-CM | POA: Insufficient documentation

## 2014-01-19 DIAGNOSIS — M419 Scoliosis, unspecified: Secondary | ICD-10-CM

## 2014-01-19 MED ORDER — HYDROCODONE-ACETAMINOPHEN 7.5-325 MG PO TABS: 1.0000 | ORAL_TABLET | Freq: Two times a day (BID) | ORAL | Status: DC

## 2014-01-19 NOTE — Progress Notes (Signed)
Subjective:    Patient ID: Mariah Aguilar, female    DOB: 10-16-1922, 81 y.o.   MRN: [id number]  HPI: [name] is a 81 year old female who returns for follow up for chronic pain and medication refill. She says her pain is located in her lower back.She rates her pain 8. Her current exercise regime is is using stationary bicycle, treadmill, and performing stretching exercises. She exercises 6 days a week. She had bilateral knee injections at the end of December by Dr. Gladstone Lighter from [facility]. We have reviewed her UDS which is inconsistent negative for hydrocodone. Dr. Letta Pate have reviewed the UDS she will be discharged from clinic. Letter and a list of Pain Management Clinic's given, she verbalizes understanding. She want's to talk to Dr. Letta Pate suggested she send a letter or email, she verbalizes understanding.  Pain Inventory Average Pain 9 Pain Right Now 8 My pain is constant, sharp and burning  In the last 24 hours, has pain interfered with the following? General activity 8 Relation with others 7 Enjoyment of life 8 What TIME of day is your pain at its worst? evening, night Sleep (in general) Poor  Pain is worse with: walking, bending and standing Pain improves with: medication Relief from Meds: 5  Mobility walk without assistance how many minutes can you walk? 30 ability to climb steps?  yes do you drive?  yes transfers alone Do you have any goals in this area?  yes  Function retired  Neuro/Psych weakness numbness tingling dizziness  Prior Studies Any changes since last visit?  no  Physicians involved in your care Any changes since last visit?  no   Family History  Problem Relation Age of Onset  . Cancer Mother     COLON  . Stroke Father    History   Social History  . Marital Status: Widowed    Spouse Name: N/A    Number of Children: N/A  . Years of Education: N/A   Social History Main Topics  . Smoking status:  Never Smoker   . Smokeless tobacco: Never Used  . Alcohol Use: No  . Drug Use: No  . Sexual Activity: None   Other Topics Concern  . None   Social History Narrative   Past Surgical History  Procedure Laterality Date  . Abdominal hysterectomy    . Colon surgery    . Colonoscopy  05/27/2011    Procedure: COLONOSCOPY;  Surgeon: Juanita Craver, MD;  Location: [facility];  Service: Endoscopy;  Laterality: N/A;   Past Medical History  Diagnosis Date  . CHF (congestive heart failure)   . Hypertension   . Colon polyps   . Arthritis   . Cardiomyopathy   . Hyperlipidemia   . Insomnia   . Internal hemorrhoids   . Cecal neoplasm   . Diverticulosis    BP 139/74 mmHg  Pulse 69  Resp 14  SpO2 99%  Opioid Risk Score:   Fall Risk Score: Moderate Fall Risk (6-13 points)  Review of Systems  Constitutional: Positive for appetite change.       Night sweats Poor appetite  Gastrointestinal: Positive for nausea and constipation.  Neurological: Positive for dizziness, weakness and numbness.       Tingling  All other systems reviewed and are negative.      Objective:   Physical Exam  Constitutional: She is oriented to person, place, and time. She appears well-developed and well-nourished.  HENT:  Head: Normocephalic and atraumatic.  Neck: Normal range of motion. Neck supple.  Cardiovascular: Normal rate and regular rhythm.   Pulmonary/Chest: Effort normal and breath sounds normal.  Musculoskeletal:  Normal Muscle Bulk and Muscle Testing Reveals: Upper extremities: Full ROM and Muscle strength 5/5 Thoracic and Lumbar Hyppersensitivity Mainly Left side Lower extremities: Full ROM and Muscle strength 5/5 Bilateral Knee Braces Intact Arises from chair with ease Narrow Based Gait   Neurological: She is alert and oriented to person, place, and time.  Skin: Skin is warm and dry.  Psychiatric: She has a normal mood and affect.  Nursing note and vitals reviewed.           Assessment & Plan:  1. Chronic low back pain with scoliosis and sacroiliac dysfunction.  Refilled: HYDROcodone: 7.5/325mg  one tablet by mouth BID #60. Last script 2. Impingement of Left 12th rib on iliac crest: Continue to avoid left lateral bending. Continue to monitor.  3. Bilateral Knee OA: Wears Knee Brace's. No Complaints/Continue to Monitor.  4. Peripheral Neuropathy: Continue Gabapentin  25 minutes of face to face patient care time was spent during this visit. All questions were encouraged and answered.

## 2014-02-01 DIAGNOSIS — N8111 Cystocele, midline: Secondary | ICD-10-CM | POA: Diagnosis not present

## 2014-02-07 DIAGNOSIS — R7309 Other abnormal glucose: Secondary | ICD-10-CM | POA: Diagnosis not present

## 2014-02-07 DIAGNOSIS — M1712 Unilateral primary osteoarthritis, left knee: Secondary | ICD-10-CM | POA: Diagnosis not present

## 2014-02-07 DIAGNOSIS — I38 Endocarditis, valve unspecified: Secondary | ICD-10-CM | POA: Diagnosis not present

## 2014-02-07 DIAGNOSIS — I10 Essential (primary) hypertension: Secondary | ICD-10-CM | POA: Diagnosis not present

## 2014-02-07 DIAGNOSIS — M1990 Unspecified osteoarthritis, unspecified site: Secondary | ICD-10-CM | POA: Diagnosis not present

## 2014-02-07 DIAGNOSIS — E785 Hyperlipidemia, unspecified: Secondary | ICD-10-CM | POA: Diagnosis not present

## 2014-02-07 DIAGNOSIS — M4807 Spinal stenosis, lumbosacral region: Secondary | ICD-10-CM | POA: Diagnosis not present

## 2014-02-07 DIAGNOSIS — I5020 Unspecified systolic (congestive) heart failure: Secondary | ICD-10-CM | POA: Diagnosis not present

## 2014-02-07 DIAGNOSIS — M1711 Unilateral primary osteoarthritis, right knee: Secondary | ICD-10-CM | POA: Diagnosis not present

## 2014-02-17 DIAGNOSIS — Z1231 Encounter for screening mammogram for malignant neoplasm of breast: Secondary | ICD-10-CM | POA: Diagnosis not present

## 2014-02-24 DIAGNOSIS — H35051 Retinal neovascularization, unspecified, right eye: Secondary | ICD-10-CM | POA: Diagnosis not present

## 2014-02-24 DIAGNOSIS — H3532 Exudative age-related macular degeneration: Secondary | ICD-10-CM | POA: Diagnosis not present

## 2014-02-28 DIAGNOSIS — N6489 Other specified disorders of breast: Secondary | ICD-10-CM | POA: Diagnosis not present

## 2014-02-28 DIAGNOSIS — R7309 Other abnormal glucose: Secondary | ICD-10-CM | POA: Diagnosis not present

## 2014-02-28 DIAGNOSIS — N63 Unspecified lump in breast: Secondary | ICD-10-CM | POA: Diagnosis not present

## 2014-02-28 DIAGNOSIS — Z79899 Other long term (current) drug therapy: Secondary | ICD-10-CM | POA: Diagnosis not present

## 2014-03-03 DIAGNOSIS — N390 Urinary tract infection, site not specified: Secondary | ICD-10-CM | POA: Diagnosis not present

## 2014-03-08 DIAGNOSIS — H35052 Retinal neovascularization, unspecified, left eye: Secondary | ICD-10-CM | POA: Diagnosis not present

## 2014-03-08 DIAGNOSIS — H3532 Exudative age-related macular degeneration: Secondary | ICD-10-CM | POA: Diagnosis not present

## 2014-04-06 DIAGNOSIS — N8111 Cystocele, midline: Secondary | ICD-10-CM | POA: Diagnosis not present

## 2014-04-19 DIAGNOSIS — H3532 Exudative age-related macular degeneration: Secondary | ICD-10-CM | POA: Diagnosis not present

## 2014-04-22 DIAGNOSIS — H3532 Exudative age-related macular degeneration: Secondary | ICD-10-CM | POA: Diagnosis not present

## 2014-05-09 DIAGNOSIS — R7309 Other abnormal glucose: Secondary | ICD-10-CM | POA: Diagnosis not present

## 2014-05-09 DIAGNOSIS — M1990 Unspecified osteoarthritis, unspecified site: Secondary | ICD-10-CM | POA: Diagnosis not present

## 2014-05-09 DIAGNOSIS — E785 Hyperlipidemia, unspecified: Secondary | ICD-10-CM | POA: Diagnosis not present

## 2014-05-09 DIAGNOSIS — I5020 Unspecified systolic (congestive) heart failure: Secondary | ICD-10-CM | POA: Diagnosis not present

## 2014-05-09 DIAGNOSIS — I10 Essential (primary) hypertension: Secondary | ICD-10-CM | POA: Diagnosis not present

## 2014-05-14 DIAGNOSIS — M1712 Unilateral primary osteoarthritis, left knee: Secondary | ICD-10-CM | POA: Diagnosis not present

## 2014-05-14 DIAGNOSIS — M25562 Pain in left knee: Secondary | ICD-10-CM | POA: Diagnosis not present

## 2014-05-14 DIAGNOSIS — M1711 Unilateral primary osteoarthritis, right knee: Secondary | ICD-10-CM | POA: Diagnosis not present

## 2014-05-14 DIAGNOSIS — M170 Bilateral primary osteoarthritis of knee: Secondary | ICD-10-CM | POA: Diagnosis not present

## 2014-05-14 DIAGNOSIS — M25561 Pain in right knee: Secondary | ICD-10-CM | POA: Diagnosis not present

## 2014-05-25 DIAGNOSIS — H3532 Exudative age-related macular degeneration: Secondary | ICD-10-CM | POA: Diagnosis not present

## 2014-05-31 ENCOUNTER — Other Ambulatory Visit: Payer: Self-pay | Admitting: Gastroenterology

## 2014-05-31 DIAGNOSIS — K5900 Constipation, unspecified: Secondary | ICD-10-CM | POA: Diagnosis not present

## 2014-05-31 DIAGNOSIS — K5730 Diverticulosis of large intestine without perforation or abscess without bleeding: Secondary | ICD-10-CM | POA: Diagnosis not present

## 2014-05-31 DIAGNOSIS — D509 Iron deficiency anemia, unspecified: Secondary | ICD-10-CM | POA: Diagnosis not present

## 2014-05-31 DIAGNOSIS — Z86010 Personal history of colonic polyps: Secondary | ICD-10-CM | POA: Diagnosis not present

## 2014-05-31 DIAGNOSIS — Z800 Family history of malignant neoplasm of digestive organs: Secondary | ICD-10-CM | POA: Diagnosis not present

## 2014-05-31 DIAGNOSIS — Z1211 Encounter for screening for malignant neoplasm of colon: Secondary | ICD-10-CM | POA: Diagnosis not present

## 2014-06-01 DIAGNOSIS — D509 Iron deficiency anemia, unspecified: Secondary | ICD-10-CM | POA: Diagnosis not present

## 2014-06-09 DIAGNOSIS — N8111 Cystocele, midline: Secondary | ICD-10-CM | POA: Diagnosis not present

## 2014-06-09 DIAGNOSIS — Z124 Encounter for screening for malignant neoplasm of cervix: Secondary | ICD-10-CM | POA: Diagnosis not present

## 2014-06-24 ENCOUNTER — Encounter (HOSPITAL_COMMUNITY): Payer: Self-pay | Admitting: *Deleted

## 2014-06-29 ENCOUNTER — Encounter (HOSPITAL_COMMUNITY): Payer: Self-pay | Admitting: Anesthesiology

## 2014-06-29 NOTE — Anesthesia Preprocedure Evaluation (Signed)
Anesthesia Evaluation  Patient identified by MRN, date of birth, ID band Patient awake    Reviewed: Allergy & Precautions, NPO status , Patient's Chart, lab work & pertinent test results  Airway Mallampati: II  TM Distance: >3 FB Neck ROM: Full    Dental no notable dental hx.    Pulmonary neg pulmonary ROS,  breath sounds clear to auscultation  Pulmonary exam normal       Cardiovascular hypertension, Pt. on medications and Pt. on home beta blockers +CHF Normal cardiovascular examRhythm:Regular Rate:Normal     Neuro/Psych negative neurological ROS  negative psych ROS   GI/Hepatic negative GI ROS, Neg liver ROS,   Endo/Other  negative endocrine ROS  Renal/GU negative Renal ROS  negative genitourinary   Musculoskeletal  (+) Arthritis -,   Abdominal   Peds negative pediatric ROS (+)  Hematology negative hematology ROS (+)   Anesthesia Other Findings   Reproductive/Obstetrics negative OB ROS                             Anesthesia Physical Anesthesia Plan  ASA: III  Anesthesia Plan: MAC   Post-op Pain Management:    Induction: Intravenous  Airway Management Planned: Natural Airway  Additional Equipment:   Intra-op Plan:   Post-operative Plan:   Informed Consent: I have reviewed the patients History and Physical, chart, labs and discussed the procedure including the risks, benefits and alternatives for the proposed anesthesia with the patient or authorized representative who has indicated his/her understanding and acceptance.   Dental advisory given  Plan Discussed with: CRNA  Anesthesia Plan Comments:         Anesthesia Quick Evaluation

## 2014-06-30 ENCOUNTER — Ambulatory Visit (HOSPITAL_COMMUNITY)
Admission: RE | Admit: 2014-06-30 | Discharge: 2014-06-30 | Disposition: A | Discharge status: Home / Self Care | Payer: Medicare Other | Source: Ambulatory Visit | Attending: Gastroenterology | Admitting: Gastroenterology

## 2014-06-30 ENCOUNTER — Encounter (HOSPITAL_COMMUNITY): Payer: Self-pay

## 2014-06-30 ENCOUNTER — Ambulatory Visit (HOSPITAL_COMMUNITY): Payer: Medicare Other | Admitting: Anesthesiology

## 2014-06-30 ENCOUNTER — Encounter (HOSPITAL_COMMUNITY)
Admission: RE | Disposition: A | Discharge status: Home / Self Care | Payer: Self-pay | Source: Ambulatory Visit | Attending: Gastroenterology

## 2014-06-30 DIAGNOSIS — K5790 Diverticulosis of intestine, part unspecified, without perforation or abscess without bleeding: Secondary | ICD-10-CM | POA: Diagnosis not present

## 2014-06-30 DIAGNOSIS — Z1211 Encounter for screening for malignant neoplasm of colon: Secondary | ICD-10-CM | POA: Diagnosis present

## 2014-06-30 DIAGNOSIS — M1990 Unspecified osteoarthritis, unspecified site: Secondary | ICD-10-CM | POA: Diagnosis not present

## 2014-06-30 DIAGNOSIS — I509 Heart failure, unspecified: Secondary | ICD-10-CM | POA: Insufficient documentation

## 2014-06-30 DIAGNOSIS — Z86010 Personal history of colonic polyps: Secondary | ICD-10-CM | POA: Insufficient documentation

## 2014-06-30 DIAGNOSIS — K5730 Diverticulosis of large intestine without perforation or abscess without bleeding: Secondary | ICD-10-CM | POA: Diagnosis not present

## 2014-06-30 DIAGNOSIS — K649 Unspecified hemorrhoids: Secondary | ICD-10-CM | POA: Insufficient documentation

## 2014-06-30 DIAGNOSIS — Z800 Family history of malignant neoplasm of digestive organs: Secondary | ICD-10-CM | POA: Diagnosis not present

## 2014-06-30 DIAGNOSIS — I10 Essential (primary) hypertension: Secondary | ICD-10-CM | POA: Insufficient documentation

## 2014-06-30 HISTORY — DX: Left bundle-branch block, unspecified: I44.7

## 2014-06-30 HISTORY — PX: COLONOSCOPY WITH PROPOFOL: SHX5780

## 2014-06-30 SURGERY — COLONOSCOPY WITH PROPOFOL
Anesthesia: Monitor Anesthesia Care

## 2014-06-30 MED ORDER — ONDANSETRON HCL 4 MG/2ML IJ SOLN
INTRAMUSCULAR | Status: AC
  Filled 2014-06-30: qty 2

## 2014-06-30 MED ORDER — LIDOCAINE HCL (CARDIAC) 20 MG/ML IV SOLN
INTRAVENOUS | Status: DC | PRN
  Administered 2014-06-30: 10 mg via INTRAVENOUS

## 2014-06-30 MED ORDER — PROPOFOL INFUSION 10 MG/ML OPTIME
INTRAVENOUS | Status: DC | PRN
  Administered 2014-06-30: 100 ug/kg/min via INTRAVENOUS

## 2014-06-30 MED ORDER — KETAMINE HCL 10 MG/ML IJ SOLN
INTRAMUSCULAR | Status: AC
  Filled 2014-06-30: qty 1

## 2014-06-30 MED ORDER — SODIUM CHLORIDE 0.9 % IV SOLN: INTRAVENOUS | Status: DC

## 2014-06-30 MED ORDER — LACTATED RINGERS IV SOLN
INTRAVENOUS | Status: DC
  Administered 2014-06-30: 1000 mL via INTRAVENOUS

## 2014-06-30 MED ORDER — KETAMINE HCL 10 MG/ML IJ SOLN
INTRAMUSCULAR | Status: DC | PRN
  Administered 2014-06-30: 10 mg via INTRAVENOUS

## 2014-06-30 MED ORDER — LIDOCAINE HCL (CARDIAC) 20 MG/ML IV SOLN
INTRAVENOUS | Status: AC
  Filled 2014-06-30: qty 5

## 2014-06-30 MED ORDER — ONDANSETRON HCL 4 MG/2ML IJ SOLN
INTRAMUSCULAR | Status: DC | PRN
  Administered 2014-06-30: 4 mg via INTRAVENOUS

## 2014-06-30 MED ORDER — PROPOFOL 10 MG/ML IV BOLUS
INTRAVENOUS | Status: AC
  Filled 2014-06-30: qty 20

## 2014-06-30 SURGICAL SUPPLY — 22 items

## 2014-06-30 NOTE — Op Note (Signed)
[facility] [place], [address]   OPERATIVE PROCEDURE REPORT  PATIENT: Mariah Aguilar, Mariah Aguilar  MR#: [id number] BIRTHDATE: 1922-04-28 GENDER: female ENDOSCOPIST: Edmonia James, MD ASSISTANT:   Laverta Baltimore, RN & Elspeth Cho, technician. PROCEDURE DATE: 07-03-2014 PRE-PROCEDURE PREPARATION: The patient was prepped with a gallon of Golytely the night prior to the procedure.  The patient has fasted for 4 hours prior to the procedure Patient fasted for 4 hours prior to procedure. PRE-PROCEDURE PHYSICAL: Patient has stable vital signs.  Neck is supple.  There is no JVD, thyromegaly or LAD.  Chest clear to auscultation.  S1 and S2 regular.  Abdomen soft, non-distended, non-tender with NABS. PROCEDURE:     Colonoscopy, diagnostic ASA CLASS:     Class III INDICATIONS:     1.  Family history of colon cancer-mother. 2. Personal history of colonic polyps-s/p right hemicoplectomy for a large cecal TVA. 3. CRC screening.Marland Kitchen MEDICATIONS:     Monitored anesthesia care  DESCRIPTION OF PROCEDURE: After the risks, benefits, and alternatives of the procedure were thoroughly explained [including a 10% missed rate of cancer and polyps], informed consent was obtained.  Digital rectal exam was performed.  The EC-[id number] ([id number])  was introduced through the anus  and advanced to the surgical anastomosis , limited by a lot of stool in the colon. No adverse events experienced.   The quality of the prep was poor. . Multiple washes were done. Small lesions could be missed. The instrument was then slowly withdrawn as the colon was fully examined. Estimated blood loss is zero unless otherwise noted in this procedure report.     COLON FINDINGS: There was extensive diverticulosis noted.  The areas of the colonic mucosa that were visualized appeared healthy with a normal vascular pattern. No masses, polyps or AVMs were noted.  The patient ahs ahd a right hemicolectomy   Retroflexed views revealed no small internal hemorrhoids. The patient tolerated the procedure without immediate complications. The scope was then withdrawn from the patient and the procedure terminated.  TIME TO CECUM:  NA WITHDRAW TIME:  NA  IMPRESSION:     Poor prep with extensive pandiverticulosis and small internal hemorrhoids.  RECOMMENDATIONS:     1.  Continue current medications. 2.  Continue surveillance. 3.  High fiber diet with liberal fluid intake. 4.  OP follow-up is advised on a PRN basis.  REPEAT EXAM:      In 3 years  for a cologaurd test..  If the patient has any abnormal GI symptoms in the interim, she have been advised to contact the office as soon as possible for further recommendations.   REFERRED PF:XTKWI Harwani, M.D.  eSigned:  Edmonia James, MD Jul 03, 2014 8:29 AM   CPT CODES:     [phone] Colonoscopy, flexible, proximal to splenic flexure; diagnostic, with or without collection of specimen(s) by brushing or washing, with or without colon decompression (separate procedure) ICD CODES:     K57.30, Z80.0, Z12.11 Encounter for screening for malignant neoplasm of colon  The ICD and CPT codes recommended by this software are interpretations from the data that the clinical staff has captured with the software.  The verification of the translation of this report to the ICD and CPT codes and modifiers is the sole responsibility of the health care institution and practicing physician where this report was generated.  [employer]. will not be held responsible for the validity of the ICD and CPT codes included on this report.  AMA assumes no liability for data contained or not contained herein. CPT is a Designer, television/film set of the [employer].  PATIENT NAME:  Mariah Aguilar, Mariah Aguilar MR#: [id number]

## 2014-06-30 NOTE — Discharge Instructions (Signed)
Colonoscopy, Care After °Refer to this sheet in the next few weeks. These instructions provide you with information on caring for yourself after your procedure. Your health care provider may also give you more specific instructions. Your treatment has been planned according to current medical practices, but problems sometimes occur. Call your health care provider if you have any problems or questions after your procedure. °WHAT TO EXPECT AFTER THE PROCEDURE  °After your procedure, it is typical to have the following: °· A small amount of blood in your stool. °· Moderate amounts of gas and mild abdominal cramping or bloating. °HOME CARE INSTRUCTIONS °· Do not drive, operate machinery, or sign important documents for 24 hours. °· You may shower and resume your regular physical activities, but move at a slower pace for the first 24 hours. °· Take frequent rest periods for the first 24 hours. °· Walk around or put a warm pack on your abdomen to help reduce abdominal cramping and bloating. °· Drink enough fluids to keep your urine clear or pale yellow. °· You may resume your normal diet as instructed by your health care provider. Avoid heavy or fried foods that are hard to digest. °· Avoid drinking alcohol for 24 hours or as instructed by your health care provider. °· Only take over-the-counter or prescription medicines as directed by your health care provider. °· If a tissue sample (biopsy) was taken during your procedure: °· Do not take aspirin or blood thinners for 7 days, or as instructed by your health care provider. °· Do not drink alcohol for 7 days, or as instructed by your health care provider. °· Eat soft foods for the first 24 hours. °SEEK MEDICAL CARE IF: °You have persistent spotting of blood in your stool 2-3 days after the procedure. °SEEK IMMEDIATE MEDICAL CARE IF: °· You have more than a small spotting of blood in your stool. °· You pass large blood clots in your stool. °· Your abdomen is swollen  (distended). °· You have nausea or vomiting. °· You have a fever. °· You have increasing abdominal pain that is not relieved with medicine. °Document Released: 08/08/2003 Document Revised: 10/14/2012 Document Reviewed: 08/31/2012 °ExitCare® Patient Information ©2015 [employer]. This information is not intended to replace advice given to you by your health care provider. Make sure you discuss any questions you have with your health care provider. ° °Monitored Anesthesia Care °Monitored anesthesia care is an anesthesia service for a medical procedure. Anesthesia is the loss of the ability to feel pain. It is produced by medicines called anesthetics. It may affect a small area of your body (local anesthesia), a large area of your body (regional anesthesia), or your entire body (general anesthesia). The need for monitored anesthesia care depends your procedure, your condition, and the potential need for regional or general anesthesia. It is often provided during procedures where:  °· General anesthesia may be needed if there are complications. This is because you need special care when you are under general anesthesia.   °· You will be under local or regional anesthesia. This is so that you are able to have higher levels of anesthesia if needed.   °· You will receive calming medicines (sedatives). This is especially the case if sedatives are given to put you in a semi-conscious state of relaxation (deep sedation). This is because the amount of sedative needed to produce this state can be hard to predict. Too much of a sedative can produce general anesthesia. °Monitored anesthesia care is performed by one   or more health care providers who have special training in all types of anesthesia. You will need to meet with these health care providers before your procedure. During this meeting, they will ask you about your medical history. They will also give you instructions to follow. (For example, you will need to stop  eating and drinking before your procedure. You may also need to stop or change medicines you are taking.) During your procedure, your health care providers will stay with you. They will:  °· Watch your condition. This includes watching your blood pressure, breathing, and level of pain.   °· Diagnose and treat problems that occur.   °· Give medicines if they are needed. These may include calming medicines (sedatives) and anesthetics.   °· Make sure you are comfortable.   °Having monitored anesthesia care does not necessarily mean that you will be under anesthesia. It does mean that your health care providers will be able to manage anesthesia if you need it or if it occurs. It also means that you will be able to have a different type of anesthesia than you are having if you need it. When your procedure is complete, your health care providers will continue to watch your condition. They will make sure any medicines wear off before you are allowed to go home.  °Document Released: 09/19/2004 Document Revised: 05/10/2013 Document Reviewed: 02/05/2012 °ExitCare® Patient Information ©2015 [employer]. This information is not intended to replace advice given to you by your health care provider. Make sure you discuss any questions you have with your health care provider. ° ° °

## 2014-06-30 NOTE — H&P (Signed)
Mariah Aguilar is an 81 y.o. female.   Chief Complaint: Colorectal cancer screening. HPI: Patient is here for a screening colonoscopy. See office notes for details.  Past Medical History  Diagnosis Date  . CHF (congestive heart failure)   . Hypertension   . Colon polyps   . Arthritis   . Cardiomyopathy   . Hyperlipidemia   . Insomnia   . Internal hemorrhoids   . Cecal neoplasm   . Diverticulosis   . Left bundle branch block (LBBB) on electrocardiogram     EKG of 5'12 shows 1st degree AV block/LBBB -Epic.    Past Surgical History  Procedure Laterality Date  . Abdominal hysterectomy    . Colon surgery    . Colonoscopy  05/27/2011    Procedure: COLONOSCOPY;  Surgeon: Juanita Craver, MD;  Location: [facility];  Service: Endoscopy;  Laterality: N/A;    Family History  Problem Relation Age of Onset  . Cancer Mother     COLON  . Stroke Father    Social History:  reports that she has never smoked. She has never used smokeless tobacco. She reports that she does not drink alcohol or use illicit drugs.  Allergies:  Allergies  Allergen Reactions  . Penicillins     Medications Prior to Admission  Medication Sig Dispense Refill  . amLODipine-valsartan (EXFORGE) 10-320 MG per tablet     . carvedilol (COREG) 12.5 MG tablet   3  . celecoxib (CELEBREX) 100 MG capsule TAKE 1 CAPSULE (100 MG TOTAL) BY MOUTH DAILY. 30 capsule 2  . desoximetasone (TOPICORT) 0.25 % cream   1  . diazepam (VALIUM) 5 MG tablet   1  . diclofenac sodium (VOLTAREN) 1 % GEL Apply 2 g topically 4 (four) times daily. 3 Tube 3  . digoxin (LANOXIN) 0.125 MG tablet Take 125 mcg by mouth daily.  3  . esomeprazole (NEXIUM) 40 MG capsule Take 40 mg by mouth daily.    . furosemide (LASIX) 40 MG tablet Take 40 mg by mouth 2 (two) times daily.      Marland Kitchen gabapentin (NEURONTIN) 100 MG capsule Take 1 capsule (100 mg total) by mouth 3 (three) times daily. 90 capsule 3  . HYDROcodone-acetaminophen (NORCO) 7.5-325 MG per  tablet Take 1 tablet by mouth 2 (two) times daily. 60 tablet 0  . ibuprofen (ADVIL,MOTRIN) 600 MG tablet     . IOPHEN C-NR 100-10 MG/5ML syrup   0  . potassium chloride (KLOR-CON) 8 MEQ CR tablet Take 8 mEq by mouth daily.      . ramipril (ALTACE) 10 MG tablet Take 10 mg by mouth 2 (two) times daily.      . risedronate (ACTONEL) 35 MG tablet Take 35 mg by mouth every 7 (seven) days. with water on empty stomach, nothing by mouth or lie down for next 30 minutes.    . rosuvastatin (CRESTOR) 10 MG tablet Take 10 mg by mouth daily.        No results found for this or any previous visit (from the past 48 hour(s)). No results found.  Review of Systems  Musculoskeletal: Positive for joint pain.    Blood pressure 153/63, pulse 79, temperature 98.5 F (36.9 C), temperature source Oral, resp. rate 19, height 5\' 3"  (1.6 m), weight 54.432 kg (120 lb), SpO2 97 %. Physical Exam  Constitutional: She appears well-developed and well-nourished.  HENT:  Head: Normocephalic and atraumatic.  Eyes: Conjunctivae and EOM are normal. Pupils are equal, round, and reactive to  light.  Neck: Normal range of motion. Neck supple.  Cardiovascular: Normal rate and regular rhythm.   Respiratory: Effort normal and breath sounds normal.  GI: Soft. Bowel sounds are normal.  Skin: Skin is warm and dry.  Psychiatric: She has a normal mood and affect. Her behavior is normal. Judgment and thought content normal.    Assessment/Plan Colorectal cancer screening: proceed with a colonoscopy at this time.  Ford Peddie 06/30/2014, 7:59 AM

## 2014-06-30 NOTE — Transfer of Care (Signed)
Immediate Anesthesia Transfer of Care Note  Patient: Mariah Aguilar  Procedure(s) Performed: Procedure(s): COLONOSCOPY WITH PROPOFOL (N/A)  Patient Location: PACU  Anesthesia Type:MAC  Level of Consciousness:  sedated, patient cooperative and responds to stimulation  Airway & Oxygen Therapy:Patient Spontanous Breathing and Patient connected to face mask oxgen  Post-op Assessment:  Report given to PACU RN and Post -op Vital signs reviewed and stable  Post vital signs:  Reviewed and stable  Last Vitals:  Filed Vitals:   06/30/14 0636  BP: 153/63  Pulse: 79  Temp: 36.9 C  Resp: 19    Complications: No apparent anesthesia complications

## 2014-06-30 NOTE — Anesthesia Postprocedure Evaluation (Signed)
  Anesthesia Post-op Note  Patient: Mariah Aguilar  Procedure(s) Performed: Procedure(s) (LRB): COLONOSCOPY WITH PROPOFOL (N/A)  Patient Location: PACU  Anesthesia Type: MAC  Level of Consciousness: awake and alert   Airway and Oxygen Therapy: Patient Spontanous Breathing  Post-op Pain: mild  Post-op Assessment: Post-op Vital signs reviewed, Patient's Cardiovascular Status Stable, Respiratory Function Stable, Patent Airway and No signs of Nausea or vomiting  Last Vitals:  Filed Vitals:   06/30/14 0824  BP: 131/89  Pulse:   Temp:   Resp: 12    Post-op Vital Signs: stable   Complications: No apparent anesthesia complications

## 2014-07-01 ENCOUNTER — Encounter (HOSPITAL_COMMUNITY): Payer: Self-pay | Admitting: Gastroenterology

## 2014-07-04 ENCOUNTER — Other Ambulatory Visit: Payer: Self-pay

## 2014-07-13 DIAGNOSIS — H3532 Exudative age-related macular degeneration: Secondary | ICD-10-CM | POA: Diagnosis not present

## 2014-08-08 DIAGNOSIS — I10 Essential (primary) hypertension: Secondary | ICD-10-CM | POA: Diagnosis not present

## 2014-08-08 DIAGNOSIS — I5020 Unspecified systolic (congestive) heart failure: Secondary | ICD-10-CM | POA: Diagnosis not present

## 2014-08-08 DIAGNOSIS — E785 Hyperlipidemia, unspecified: Secondary | ICD-10-CM | POA: Diagnosis not present

## 2014-08-08 DIAGNOSIS — R7309 Other abnormal glucose: Secondary | ICD-10-CM | POA: Diagnosis not present

## 2014-08-08 DIAGNOSIS — M1990 Unspecified osteoarthritis, unspecified site: Secondary | ICD-10-CM | POA: Diagnosis not present

## 2014-08-17 DIAGNOSIS — H3532 Exudative age-related macular degeneration: Secondary | ICD-10-CM | POA: Diagnosis not present

## 2014-08-23 DIAGNOSIS — H3532 Exudative age-related macular degeneration: Secondary | ICD-10-CM | POA: Diagnosis not present

## 2014-08-29 DIAGNOSIS — M8580 Other specified disorders of bone density and structure, unspecified site: Secondary | ICD-10-CM | POA: Diagnosis not present

## 2014-08-29 DIAGNOSIS — N63 Unspecified lump in breast: Secondary | ICD-10-CM | POA: Diagnosis not present

## 2014-08-29 DIAGNOSIS — M810 Age-related osteoporosis without current pathological fracture: Secondary | ICD-10-CM | POA: Diagnosis not present

## 2014-09-21 DIAGNOSIS — H3532 Exudative age-related macular degeneration: Secondary | ICD-10-CM | POA: Diagnosis not present

## 2014-09-28 DIAGNOSIS — H3532 Exudative age-related macular degeneration: Secondary | ICD-10-CM | POA: Diagnosis not present

## 2014-10-03 DIAGNOSIS — Z90710 Acquired absence of both cervix and uterus: Secondary | ICD-10-CM | POA: Diagnosis not present

## 2014-10-03 DIAGNOSIS — N8111 Cystocele, midline: Secondary | ICD-10-CM | POA: Diagnosis not present

## 2014-10-26 DIAGNOSIS — H353221 Exudative age-related macular degeneration, left eye, with active choroidal neovascularization: Secondary | ICD-10-CM | POA: Diagnosis not present

## 2014-11-07 DIAGNOSIS — M170 Bilateral primary osteoarthritis of knee: Secondary | ICD-10-CM | POA: Diagnosis not present

## 2014-11-08 DIAGNOSIS — E785 Hyperlipidemia, unspecified: Secondary | ICD-10-CM | POA: Diagnosis not present

## 2014-11-08 DIAGNOSIS — R7309 Other abnormal glucose: Secondary | ICD-10-CM | POA: Diagnosis not present

## 2014-11-08 DIAGNOSIS — I119 Hypertensive heart disease without heart failure: Secondary | ICD-10-CM | POA: Diagnosis not present

## 2014-11-08 DIAGNOSIS — I38 Endocarditis, valve unspecified: Secondary | ICD-10-CM | POA: Diagnosis not present

## 2014-11-08 DIAGNOSIS — M1990 Unspecified osteoarthritis, unspecified site: Secondary | ICD-10-CM | POA: Diagnosis not present

## 2014-11-09 DIAGNOSIS — H353211 Exudative age-related macular degeneration, right eye, with active choroidal neovascularization: Secondary | ICD-10-CM | POA: Diagnosis not present

## 2014-11-15 DIAGNOSIS — R7309 Other abnormal glucose: Secondary | ICD-10-CM | POA: Diagnosis not present

## 2014-11-15 DIAGNOSIS — E785 Hyperlipidemia, unspecified: Secondary | ICD-10-CM | POA: Diagnosis not present

## 2014-11-15 DIAGNOSIS — I5020 Unspecified systolic (congestive) heart failure: Secondary | ICD-10-CM | POA: Diagnosis not present

## 2014-11-15 DIAGNOSIS — I10 Essential (primary) hypertension: Secondary | ICD-10-CM | POA: Diagnosis not present

## 2014-12-05 DIAGNOSIS — N8111 Cystocele, midline: Secondary | ICD-10-CM | POA: Diagnosis not present

## 2014-12-05 DIAGNOSIS — Z23 Encounter for immunization: Secondary | ICD-10-CM | POA: Diagnosis not present

## 2014-12-07 DIAGNOSIS — H353221 Exudative age-related macular degeneration, left eye, with active choroidal neovascularization: Secondary | ICD-10-CM | POA: Diagnosis not present

## 2014-12-28 DIAGNOSIS — R7309 Other abnormal glucose: Secondary | ICD-10-CM | POA: Diagnosis not present

## 2014-12-28 DIAGNOSIS — E785 Hyperlipidemia, unspecified: Secondary | ICD-10-CM | POA: Diagnosis not present

## 2014-12-28 DIAGNOSIS — D649 Anemia, unspecified: Secondary | ICD-10-CM | POA: Diagnosis not present

## 2014-12-28 DIAGNOSIS — I10 Essential (primary) hypertension: Secondary | ICD-10-CM | POA: Diagnosis not present

## 2014-12-28 DIAGNOSIS — E559 Vitamin D deficiency, unspecified: Secondary | ICD-10-CM | POA: Diagnosis not present

## 2015-01-13 DIAGNOSIS — H353211 Exudative age-related macular degeneration, right eye, with active choroidal neovascularization: Secondary | ICD-10-CM | POA: Diagnosis not present

## 2015-01-13 DIAGNOSIS — H353221 Exudative age-related macular degeneration, left eye, with active choroidal neovascularization: Secondary | ICD-10-CM | POA: Diagnosis not present

## 2015-01-13 DIAGNOSIS — H353134 Nonexudative age-related macular degeneration, bilateral, advanced atrophic with subfoveal involvement: Secondary | ICD-10-CM | POA: Diagnosis not present

## 2015-01-13 DIAGNOSIS — H35359 Cystoid macular degeneration, unspecified eye: Secondary | ICD-10-CM | POA: Diagnosis not present

## 2015-01-30 DIAGNOSIS — H353211 Exudative age-related macular degeneration, right eye, with active choroidal neovascularization: Secondary | ICD-10-CM | POA: Diagnosis not present

## 2015-01-30 DIAGNOSIS — H353134 Nonexudative age-related macular degeneration, bilateral, advanced atrophic with subfoveal involvement: Secondary | ICD-10-CM | POA: Diagnosis not present

## 2015-01-30 DIAGNOSIS — H353221 Exudative age-related macular degeneration, left eye, with active choroidal neovascularization: Secondary | ICD-10-CM | POA: Diagnosis not present

## 2015-02-07 DIAGNOSIS — M1990 Unspecified osteoarthritis, unspecified site: Secondary | ICD-10-CM | POA: Diagnosis not present

## 2015-02-07 DIAGNOSIS — I340 Nonrheumatic mitral (valve) insufficiency: Secondary | ICD-10-CM | POA: Diagnosis not present

## 2015-02-07 DIAGNOSIS — E785 Hyperlipidemia, unspecified: Secondary | ICD-10-CM | POA: Diagnosis not present

## 2015-02-07 DIAGNOSIS — I119 Hypertensive heart disease without heart failure: Secondary | ICD-10-CM | POA: Diagnosis not present

## 2015-02-07 DIAGNOSIS — N8111 Cystocele, midline: Secondary | ICD-10-CM | POA: Diagnosis not present

## 2015-02-07 DIAGNOSIS — I38 Endocarditis, valve unspecified: Secondary | ICD-10-CM | POA: Diagnosis not present

## 2015-02-07 DIAGNOSIS — E119 Type 2 diabetes mellitus without complications: Secondary | ICD-10-CM | POA: Diagnosis not present

## 2015-02-16 DIAGNOSIS — D649 Anemia, unspecified: Secondary | ICD-10-CM | POA: Diagnosis not present

## 2015-02-16 DIAGNOSIS — R1013 Epigastric pain: Secondary | ICD-10-CM | POA: Diagnosis not present

## 2015-02-16 DIAGNOSIS — I10 Essential (primary) hypertension: Secondary | ICD-10-CM | POA: Diagnosis not present

## 2015-02-21 DIAGNOSIS — I700 Atherosclerosis of aorta: Secondary | ICD-10-CM | POA: Diagnosis not present

## 2015-02-21 DIAGNOSIS — R109 Unspecified abdominal pain: Secondary | ICD-10-CM | POA: Diagnosis not present

## 2015-02-21 DIAGNOSIS — R1010 Upper abdominal pain, unspecified: Secondary | ICD-10-CM | POA: Diagnosis not present

## 2015-02-21 DIAGNOSIS — I77811 Abdominal aortic ectasia: Secondary | ICD-10-CM | POA: Diagnosis not present

## 2015-02-23 DIAGNOSIS — E785 Hyperlipidemia, unspecified: Secondary | ICD-10-CM | POA: Diagnosis not present

## 2015-02-23 DIAGNOSIS — I38 Endocarditis, valve unspecified: Secondary | ICD-10-CM | POA: Diagnosis not present

## 2015-02-23 DIAGNOSIS — I119 Hypertensive heart disease without heart failure: Secondary | ICD-10-CM | POA: Diagnosis not present

## 2015-02-23 DIAGNOSIS — E119 Type 2 diabetes mellitus without complications: Secondary | ICD-10-CM | POA: Diagnosis not present

## 2015-02-23 DIAGNOSIS — I340 Nonrheumatic mitral (valve) insufficiency: Secondary | ICD-10-CM | POA: Diagnosis not present

## 2015-03-01 ENCOUNTER — Other Ambulatory Visit: Payer: Self-pay | Admitting: Internal Medicine

## 2015-03-01 DIAGNOSIS — R109 Unspecified abdominal pain: Secondary | ICD-10-CM

## 2015-03-01 DIAGNOSIS — R102 Pelvic and perineal pain: Secondary | ICD-10-CM

## 2015-03-02 DIAGNOSIS — R928 Other abnormal and inconclusive findings on diagnostic imaging of breast: Secondary | ICD-10-CM | POA: Diagnosis not present

## 2015-03-13 ENCOUNTER — Encounter: Payer: Self-pay | Admitting: Podiatry

## 2015-03-13 ENCOUNTER — Ambulatory Visit (INDEPENDENT_AMBULATORY_CARE_PROVIDER_SITE_OTHER): Payer: Medicare Other | Admitting: Podiatry

## 2015-03-13 VITALS — BP 127/68 | HR 61 | Resp 16

## 2015-03-13 DIAGNOSIS — L600 Ingrowing nail: Secondary | ICD-10-CM | POA: Diagnosis not present

## 2015-03-13 DIAGNOSIS — B351 Tinea unguium: Secondary | ICD-10-CM | POA: Diagnosis not present

## 2015-03-13 NOTE — Patient Instructions (Signed)

## 2015-03-15 ENCOUNTER — Other Ambulatory Visit: Payer: Medicare Other

## 2015-03-15 NOTE — Progress Notes (Signed)
Subjective:     Patient ID: Mariah Aguilar, female   DOB: 1922-06-28, 82 y.o.   MRN: [id number]  HPI patient presents with very thickened hallux nails bilateral that she cannot cut and they can become painful if she wears shoe gear when they get too thick   Review of Systems     Objective:   Physical Exam Neurovascular status was found to be intact with no other health history changes and patient's noted to have severely thickened hallux nailbeds bilateral that are loose and moderately painful when pressed. Patient has good digital perfusion    Assessment:     Damaged left hallux nails bilateral    Plan:     Reviewed condition with patient and caregiver and at this time nail removal is recommended but not permanent psoas did not impede healing. They understand they will regrow over time but today I infiltrated each hallux 60 mg Xylocaine Marcaine mixture under sterile conditions remove the hallux nails and applied sterile dressing. Instructed on soaks and reappoint

## 2015-03-16 ENCOUNTER — Ambulatory Visit
Admission: RE | Admit: 2015-03-16 | Discharge: 2015-03-16 | Disposition: A | Discharge status: Home / Self Care | Payer: Medicare Other | Source: Ambulatory Visit | Attending: Internal Medicine | Admitting: Internal Medicine

## 2015-03-16 DIAGNOSIS — R102 Pelvic and perineal pain: Secondary | ICD-10-CM

## 2015-03-16 DIAGNOSIS — R109 Unspecified abdominal pain: Secondary | ICD-10-CM

## 2015-03-16 DIAGNOSIS — R1084 Generalized abdominal pain: Secondary | ICD-10-CM | POA: Diagnosis not present

## 2015-03-16 MED ORDER — IOPAMIDOL (ISOVUE-300) INJECTION 61%
100.0000 mL | Freq: Once | INTRAVENOUS | Status: AC | PRN
  Administered 2015-03-16: 100 mL via INTRAVENOUS

## 2015-03-20 ENCOUNTER — Telehealth: Payer: Self-pay | Admitting: *Deleted

## 2015-03-20 DIAGNOSIS — H353134 Nonexudative age-related macular degeneration, bilateral, advanced atrophic with subfoveal involvement: Secondary | ICD-10-CM | POA: Diagnosis not present

## 2015-03-20 DIAGNOSIS — H353221 Exudative age-related macular degeneration, left eye, with active choroidal neovascularization: Secondary | ICD-10-CM | POA: Diagnosis not present

## 2015-03-20 NOTE — Telephone Encounter (Signed)
Tried to call patient at [phone] (Home #) to check to see how the patient was doing from getting two nails removed on Monday, March 13, 2015. I could not leave a message because answering machine said, "memory full".

## 2015-04-04 DIAGNOSIS — N8111 Cystocele, midline: Secondary | ICD-10-CM | POA: Diagnosis not present

## 2015-04-10 DIAGNOSIS — M4807 Spinal stenosis, lumbosacral region: Secondary | ICD-10-CM | POA: Diagnosis not present

## 2015-04-27 DIAGNOSIS — I10 Essential (primary) hypertension: Secondary | ICD-10-CM | POA: Diagnosis not present

## 2015-04-27 DIAGNOSIS — E1165 Type 2 diabetes mellitus with hyperglycemia: Secondary | ICD-10-CM | POA: Diagnosis not present

## 2015-04-27 DIAGNOSIS — I7090 Unspecified atherosclerosis: Secondary | ICD-10-CM | POA: Diagnosis not present

## 2015-04-27 DIAGNOSIS — K5730 Diverticulosis of large intestine without perforation or abscess without bleeding: Secondary | ICD-10-CM | POA: Diagnosis not present

## 2015-05-08 DIAGNOSIS — H353211 Exudative age-related macular degeneration, right eye, with active choroidal neovascularization: Secondary | ICD-10-CM | POA: Diagnosis not present

## 2015-05-08 DIAGNOSIS — H353221 Exudative age-related macular degeneration, left eye, with active choroidal neovascularization: Secondary | ICD-10-CM | POA: Diagnosis not present

## 2015-05-09 DIAGNOSIS — W010XXD Fall on same level from slipping, tripping and stumbling without subsequent striking against object, subsequent encounter: Secondary | ICD-10-CM | POA: Diagnosis not present

## 2015-05-09 DIAGNOSIS — E119 Type 2 diabetes mellitus without complications: Secondary | ICD-10-CM | POA: Diagnosis not present

## 2015-05-09 DIAGNOSIS — I38 Endocarditis, valve unspecified: Secondary | ICD-10-CM | POA: Diagnosis not present

## 2015-05-09 DIAGNOSIS — H353211 Exudative age-related macular degeneration, right eye, with active choroidal neovascularization: Secondary | ICD-10-CM | POA: Diagnosis not present

## 2015-05-09 DIAGNOSIS — E785 Hyperlipidemia, unspecified: Secondary | ICD-10-CM | POA: Diagnosis not present

## 2015-05-09 DIAGNOSIS — I340 Nonrheumatic mitral (valve) insufficiency: Secondary | ICD-10-CM | POA: Diagnosis not present

## 2015-05-09 DIAGNOSIS — M1990 Unspecified osteoarthritis, unspecified site: Secondary | ICD-10-CM | POA: Diagnosis not present

## 2015-05-09 DIAGNOSIS — I119 Hypertensive heart disease without heart failure: Secondary | ICD-10-CM | POA: Diagnosis not present

## 2015-06-08 DIAGNOSIS — M1711 Unilateral primary osteoarthritis, right knee: Secondary | ICD-10-CM | POA: Diagnosis not present

## 2015-06-08 DIAGNOSIS — M170 Bilateral primary osteoarthritis of knee: Secondary | ICD-10-CM | POA: Diagnosis not present

## 2015-06-08 DIAGNOSIS — M1712 Unilateral primary osteoarthritis, left knee: Secondary | ICD-10-CM | POA: Diagnosis not present

## 2015-06-12 DIAGNOSIS — Z90710 Acquired absence of both cervix and uterus: Secondary | ICD-10-CM | POA: Diagnosis not present

## 2015-06-12 DIAGNOSIS — N8111 Cystocele, midline: Secondary | ICD-10-CM | POA: Diagnosis not present

## 2015-06-20 DIAGNOSIS — H353211 Exudative age-related macular degeneration, right eye, with active choroidal neovascularization: Secondary | ICD-10-CM | POA: Diagnosis not present

## 2015-06-20 DIAGNOSIS — H353221 Exudative age-related macular degeneration, left eye, with active choroidal neovascularization: Secondary | ICD-10-CM | POA: Diagnosis not present

## 2015-06-28 DIAGNOSIS — I129 Hypertensive chronic kidney disease with stage 1 through stage 4 chronic kidney disease, or unspecified chronic kidney disease: Secondary | ICD-10-CM | POA: Diagnosis not present

## 2015-06-28 DIAGNOSIS — N182 Chronic kidney disease, stage 2 (mild): Secondary | ICD-10-CM | POA: Diagnosis not present

## 2015-06-28 DIAGNOSIS — E1122 Type 2 diabetes mellitus with diabetic chronic kidney disease: Secondary | ICD-10-CM | POA: Diagnosis not present

## 2015-06-28 DIAGNOSIS — N08 Glomerular disorders in diseases classified elsewhere: Secondary | ICD-10-CM | POA: Diagnosis not present

## 2015-06-28 DIAGNOSIS — Z0000 Encounter for general adult medical examination without abnormal findings: Secondary | ICD-10-CM | POA: Diagnosis not present

## 2015-07-07 DIAGNOSIS — N182 Chronic kidney disease, stage 2 (mild): Secondary | ICD-10-CM | POA: Diagnosis not present

## 2015-07-07 DIAGNOSIS — I129 Hypertensive chronic kidney disease with stage 1 through stage 4 chronic kidney disease, or unspecified chronic kidney disease: Secondary | ICD-10-CM | POA: Diagnosis not present

## 2015-07-07 DIAGNOSIS — H353221 Exudative age-related macular degeneration, left eye, with active choroidal neovascularization: Secondary | ICD-10-CM | POA: Diagnosis not present

## 2015-07-31 DIAGNOSIS — H353134 Nonexudative age-related macular degeneration, bilateral, advanced atrophic with subfoveal involvement: Secondary | ICD-10-CM | POA: Diagnosis not present

## 2015-07-31 DIAGNOSIS — H35359 Cystoid macular degeneration, unspecified eye: Secondary | ICD-10-CM | POA: Diagnosis not present

## 2015-07-31 DIAGNOSIS — H353221 Exudative age-related macular degeneration, left eye, with active choroidal neovascularization: Secondary | ICD-10-CM | POA: Diagnosis not present

## 2015-07-31 DIAGNOSIS — H353211 Exudative age-related macular degeneration, right eye, with active choroidal neovascularization: Secondary | ICD-10-CM | POA: Diagnosis not present

## 2015-08-14 DIAGNOSIS — Z90710 Acquired absence of both cervix and uterus: Secondary | ICD-10-CM | POA: Diagnosis not present

## 2015-08-14 DIAGNOSIS — N8111 Cystocele, midline: Secondary | ICD-10-CM | POA: Diagnosis not present

## 2015-08-15 DIAGNOSIS — I340 Nonrheumatic mitral (valve) insufficiency: Secondary | ICD-10-CM | POA: Diagnosis not present

## 2015-08-15 DIAGNOSIS — E785 Hyperlipidemia, unspecified: Secondary | ICD-10-CM | POA: Diagnosis not present

## 2015-08-15 DIAGNOSIS — I38 Endocarditis, valve unspecified: Secondary | ICD-10-CM | POA: Diagnosis not present

## 2015-08-15 DIAGNOSIS — M1990 Unspecified osteoarthritis, unspecified site: Secondary | ICD-10-CM | POA: Diagnosis not present

## 2015-08-15 DIAGNOSIS — I119 Hypertensive heart disease without heart failure: Secondary | ICD-10-CM | POA: Diagnosis not present

## 2015-08-15 DIAGNOSIS — E119 Type 2 diabetes mellitus without complications: Secondary | ICD-10-CM | POA: Diagnosis not present

## 2015-08-28 DIAGNOSIS — N08 Glomerular disorders in diseases classified elsewhere: Secondary | ICD-10-CM | POA: Diagnosis not present

## 2015-08-28 DIAGNOSIS — E1122 Type 2 diabetes mellitus with diabetic chronic kidney disease: Secondary | ICD-10-CM | POA: Diagnosis not present

## 2015-08-28 DIAGNOSIS — I129 Hypertensive chronic kidney disease with stage 1 through stage 4 chronic kidney disease, or unspecified chronic kidney disease: Secondary | ICD-10-CM | POA: Diagnosis not present

## 2015-08-28 DIAGNOSIS — N182 Chronic kidney disease, stage 2 (mild): Secondary | ICD-10-CM | POA: Diagnosis not present

## 2015-09-07 DIAGNOSIS — H353211 Exudative age-related macular degeneration, right eye, with active choroidal neovascularization: Secondary | ICD-10-CM | POA: Diagnosis not present

## 2015-09-07 DIAGNOSIS — H353134 Nonexudative age-related macular degeneration, bilateral, advanced atrophic with subfoveal involvement: Secondary | ICD-10-CM | POA: Diagnosis not present

## 2015-09-07 DIAGNOSIS — H353221 Exudative age-related macular degeneration, left eye, with active choroidal neovascularization: Secondary | ICD-10-CM | POA: Diagnosis not present

## 2015-09-23 DIAGNOSIS — M170 Bilateral primary osteoarthritis of knee: Secondary | ICD-10-CM | POA: Diagnosis not present

## 2015-09-23 DIAGNOSIS — M1711 Unilateral primary osteoarthritis, right knee: Secondary | ICD-10-CM | POA: Diagnosis not present

## 2015-09-23 DIAGNOSIS — M1712 Unilateral primary osteoarthritis, left knee: Secondary | ICD-10-CM | POA: Diagnosis not present

## 2015-09-25 DIAGNOSIS — H353211 Exudative age-related macular degeneration, right eye, with active choroidal neovascularization: Secondary | ICD-10-CM | POA: Diagnosis not present

## 2015-10-06 DIAGNOSIS — M25511 Pain in right shoulder: Secondary | ICD-10-CM | POA: Diagnosis not present

## 2015-10-10 ENCOUNTER — Other Ambulatory Visit: Payer: Self-pay | Admitting: Orthopedic Surgery

## 2015-10-10 DIAGNOSIS — I517 Cardiomegaly: Secondary | ICD-10-CM

## 2015-10-16 ENCOUNTER — Inpatient Hospital Stay
Admission: RE | Admit: 2015-10-16 | Discharge: 2015-10-16 | Disposition: A | Discharge status: Home / Self Care | Payer: Self-pay | Source: Ambulatory Visit | Attending: Orthopedic Surgery | Admitting: Orthopedic Surgery

## 2015-10-16 ENCOUNTER — Ambulatory Visit
Admission: RE | Admit: 2015-10-16 | Discharge: 2015-10-16 | Disposition: A | Discharge status: Home / Self Care | Payer: Medicare Other | Source: Ambulatory Visit | Attending: Orthopedic Surgery | Admitting: Orthopedic Surgery

## 2015-10-16 ENCOUNTER — Other Ambulatory Visit: Payer: Self-pay | Admitting: Orthopedic Surgery

## 2015-10-16 DIAGNOSIS — I517 Cardiomegaly: Secondary | ICD-10-CM | POA: Diagnosis not present

## 2015-10-16 DIAGNOSIS — I700 Atherosclerosis of aorta: Secondary | ICD-10-CM | POA: Diagnosis not present

## 2015-10-16 MED ORDER — IOPAMIDOL (ISOVUE-300) INJECTION 61%
75.0000 mL | Freq: Once | INTRAVENOUS | Status: AC | PRN
  Administered 2015-10-16: 75 mL via INTRAVENOUS

## 2015-10-23 DIAGNOSIS — N8111 Cystocele, midline: Secondary | ICD-10-CM | POA: Diagnosis not present

## 2015-10-23 DIAGNOSIS — Z23 Encounter for immunization: Secondary | ICD-10-CM | POA: Diagnosis not present

## 2015-10-24 DIAGNOSIS — E119 Type 2 diabetes mellitus without complications: Secondary | ICD-10-CM | POA: Diagnosis not present

## 2015-10-24 DIAGNOSIS — I38 Endocarditis, valve unspecified: Secondary | ICD-10-CM | POA: Diagnosis not present

## 2015-10-24 DIAGNOSIS — M1990 Unspecified osteoarthritis, unspecified site: Secondary | ICD-10-CM | POA: Diagnosis not present

## 2015-10-24 DIAGNOSIS — E785 Hyperlipidemia, unspecified: Secondary | ICD-10-CM | POA: Diagnosis not present

## 2015-10-24 DIAGNOSIS — I340 Nonrheumatic mitral (valve) insufficiency: Secondary | ICD-10-CM | POA: Diagnosis not present

## 2015-10-24 DIAGNOSIS — I119 Hypertensive heart disease without heart failure: Secondary | ICD-10-CM | POA: Diagnosis not present

## 2015-11-06 DIAGNOSIS — R194 Change in bowel habit: Secondary | ICD-10-CM | POA: Diagnosis not present

## 2015-11-06 DIAGNOSIS — Z86010 Personal history of colonic polyps: Secondary | ICD-10-CM | POA: Diagnosis not present

## 2015-11-06 DIAGNOSIS — K625 Hemorrhage of anus and rectum: Secondary | ICD-10-CM | POA: Diagnosis not present

## 2015-11-06 DIAGNOSIS — Z800 Family history of malignant neoplasm of digestive organs: Secondary | ICD-10-CM | POA: Diagnosis not present

## 2015-11-06 DIAGNOSIS — K5730 Diverticulosis of large intestine without perforation or abscess without bleeding: Secondary | ICD-10-CM | POA: Diagnosis not present

## 2015-11-07 DIAGNOSIS — D649 Anemia, unspecified: Secondary | ICD-10-CM | POA: Diagnosis not present

## 2015-11-07 DIAGNOSIS — E784 Other hyperlipidemia: Secondary | ICD-10-CM | POA: Diagnosis not present

## 2015-11-07 DIAGNOSIS — R946 Abnormal results of thyroid function studies: Secondary | ICD-10-CM | POA: Diagnosis not present

## 2015-11-13 DIAGNOSIS — H353221 Exudative age-related macular degeneration, left eye, with active choroidal neovascularization: Secondary | ICD-10-CM | POA: Diagnosis not present

## 2015-11-20 DIAGNOSIS — H353211 Exudative age-related macular degeneration, right eye, with active choroidal neovascularization: Secondary | ICD-10-CM | POA: Diagnosis not present

## 2015-12-18 DIAGNOSIS — N8111 Cystocele, midline: Secondary | ICD-10-CM | POA: Diagnosis not present

## 2015-12-21 DIAGNOSIS — M1712 Unilateral primary osteoarthritis, left knee: Secondary | ICD-10-CM | POA: Diagnosis not present

## 2015-12-21 DIAGNOSIS — M1711 Unilateral primary osteoarthritis, right knee: Secondary | ICD-10-CM | POA: Diagnosis not present

## 2015-12-21 DIAGNOSIS — M170 Bilateral primary osteoarthritis of knee: Secondary | ICD-10-CM | POA: Diagnosis not present

## 2015-12-25 DIAGNOSIS — I129 Hypertensive chronic kidney disease with stage 1 through stage 4 chronic kidney disease, or unspecified chronic kidney disease: Secondary | ICD-10-CM | POA: Diagnosis not present

## 2015-12-25 DIAGNOSIS — E1122 Type 2 diabetes mellitus with diabetic chronic kidney disease: Secondary | ICD-10-CM | POA: Diagnosis not present

## 2015-12-25 DIAGNOSIS — N182 Chronic kidney disease, stage 2 (mild): Secondary | ICD-10-CM | POA: Diagnosis not present

## 2015-12-25 DIAGNOSIS — Z79899 Other long term (current) drug therapy: Secondary | ICD-10-CM | POA: Diagnosis not present

## 2015-12-25 DIAGNOSIS — Z1389 Encounter for screening for other disorder: Secondary | ICD-10-CM | POA: Diagnosis not present

## 2015-12-25 DIAGNOSIS — N08 Glomerular disorders in diseases classified elsewhere: Secondary | ICD-10-CM | POA: Diagnosis not present

## 2016-01-15 DIAGNOSIS — H353221 Exudative age-related macular degeneration, left eye, with active choroidal neovascularization: Secondary | ICD-10-CM | POA: Diagnosis not present

## 2016-01-18 DIAGNOSIS — H353211 Exudative age-related macular degeneration, right eye, with active choroidal neovascularization: Secondary | ICD-10-CM | POA: Diagnosis not present

## 2016-01-23 DIAGNOSIS — E119 Type 2 diabetes mellitus without complications: Secondary | ICD-10-CM | POA: Diagnosis not present

## 2016-01-23 DIAGNOSIS — I38 Endocarditis, valve unspecified: Secondary | ICD-10-CM | POA: Diagnosis not present

## 2016-01-23 DIAGNOSIS — I340 Nonrheumatic mitral (valve) insufficiency: Secondary | ICD-10-CM | POA: Diagnosis not present

## 2016-01-23 DIAGNOSIS — I119 Hypertensive heart disease without heart failure: Secondary | ICD-10-CM | POA: Diagnosis not present

## 2016-01-23 DIAGNOSIS — E785 Hyperlipidemia, unspecified: Secondary | ICD-10-CM | POA: Diagnosis not present

## 2016-01-23 DIAGNOSIS — M1990 Unspecified osteoarthritis, unspecified site: Secondary | ICD-10-CM | POA: Diagnosis not present

## 2016-02-26 DIAGNOSIS — N8111 Cystocele, midline: Secondary | ICD-10-CM | POA: Diagnosis not present

## 2016-03-11 DIAGNOSIS — H353211 Exudative age-related macular degeneration, right eye, with active choroidal neovascularization: Secondary | ICD-10-CM | POA: Diagnosis not present

## 2016-03-13 DIAGNOSIS — Z1231 Encounter for screening mammogram for malignant neoplasm of breast: Secondary | ICD-10-CM | POA: Diagnosis not present

## 2016-03-13 DIAGNOSIS — I5020 Unspecified systolic (congestive) heart failure: Secondary | ICD-10-CM | POA: Diagnosis not present

## 2016-03-13 DIAGNOSIS — I10 Essential (primary) hypertension: Secondary | ICD-10-CM | POA: Diagnosis not present

## 2016-03-13 DIAGNOSIS — E119 Type 2 diabetes mellitus without complications: Secondary | ICD-10-CM | POA: Diagnosis not present

## 2016-03-13 DIAGNOSIS — E785 Hyperlipidemia, unspecified: Secondary | ICD-10-CM | POA: Diagnosis not present

## 2016-04-15 DIAGNOSIS — H353221 Exudative age-related macular degeneration, left eye, with active choroidal neovascularization: Secondary | ICD-10-CM | POA: Diagnosis not present

## 2016-04-16 DIAGNOSIS — M25561 Pain in right knee: Secondary | ICD-10-CM | POA: Diagnosis not present

## 2016-04-16 DIAGNOSIS — M1712 Unilateral primary osteoarthritis, left knee: Secondary | ICD-10-CM | POA: Diagnosis not present

## 2016-04-16 DIAGNOSIS — M170 Bilateral primary osteoarthritis of knee: Secondary | ICD-10-CM | POA: Diagnosis not present

## 2016-04-16 DIAGNOSIS — M1711 Unilateral primary osteoarthritis, right knee: Secondary | ICD-10-CM | POA: Diagnosis not present

## 2016-04-16 DIAGNOSIS — M25562 Pain in left knee: Secondary | ICD-10-CM | POA: Diagnosis not present

## 2016-04-22 DIAGNOSIS — N8111 Cystocele, midline: Secondary | ICD-10-CM | POA: Diagnosis not present

## 2016-04-25 DIAGNOSIS — E1122 Type 2 diabetes mellitus with diabetic chronic kidney disease: Secondary | ICD-10-CM | POA: Diagnosis not present

## 2016-04-25 DIAGNOSIS — N08 Glomerular disorders in diseases classified elsewhere: Secondary | ICD-10-CM | POA: Diagnosis not present

## 2016-04-25 DIAGNOSIS — I129 Hypertensive chronic kidney disease with stage 1 through stage 4 chronic kidney disease, or unspecified chronic kidney disease: Secondary | ICD-10-CM | POA: Diagnosis not present

## 2016-04-25 DIAGNOSIS — N182 Chronic kidney disease, stage 2 (mild): Secondary | ICD-10-CM | POA: Diagnosis not present

## 2016-04-26 DIAGNOSIS — E1122 Type 2 diabetes mellitus with diabetic chronic kidney disease: Secondary | ICD-10-CM | POA: Diagnosis not present

## 2016-05-06 DIAGNOSIS — H353221 Exudative age-related macular degeneration, left eye, with active choroidal neovascularization: Secondary | ICD-10-CM | POA: Diagnosis not present

## 2016-05-06 DIAGNOSIS — H353134 Nonexudative age-related macular degeneration, bilateral, advanced atrophic with subfoveal involvement: Secondary | ICD-10-CM | POA: Diagnosis not present

## 2016-05-06 DIAGNOSIS — H353211 Exudative age-related macular degeneration, right eye, with active choroidal neovascularization: Secondary | ICD-10-CM | POA: Diagnosis not present

## 2016-05-06 DIAGNOSIS — H35359 Cystoid macular degeneration, unspecified eye: Secondary | ICD-10-CM | POA: Diagnosis not present

## 2016-05-09 DIAGNOSIS — I340 Nonrheumatic mitral (valve) insufficiency: Secondary | ICD-10-CM | POA: Diagnosis not present

## 2016-05-09 DIAGNOSIS — I119 Hypertensive heart disease without heart failure: Secondary | ICD-10-CM | POA: Diagnosis not present

## 2016-05-09 DIAGNOSIS — M1990 Unspecified osteoarthritis, unspecified site: Secondary | ICD-10-CM | POA: Diagnosis not present

## 2016-05-09 DIAGNOSIS — I38 Endocarditis, valve unspecified: Secondary | ICD-10-CM | POA: Diagnosis not present

## 2016-05-09 DIAGNOSIS — E785 Hyperlipidemia, unspecified: Secondary | ICD-10-CM | POA: Diagnosis not present

## 2016-05-09 DIAGNOSIS — E119 Type 2 diabetes mellitus without complications: Secondary | ICD-10-CM | POA: Diagnosis not present

## 2016-05-20 DIAGNOSIS — H35359 Cystoid macular degeneration, unspecified eye: Secondary | ICD-10-CM | POA: Diagnosis not present

## 2016-05-20 DIAGNOSIS — H353211 Exudative age-related macular degeneration, right eye, with active choroidal neovascularization: Secondary | ICD-10-CM | POA: Diagnosis not present

## 2016-05-20 DIAGNOSIS — H353134 Nonexudative age-related macular degeneration, bilateral, advanced atrophic with subfoveal involvement: Secondary | ICD-10-CM | POA: Diagnosis not present

## 2016-05-20 DIAGNOSIS — H353221 Exudative age-related macular degeneration, left eye, with active choroidal neovascularization: Secondary | ICD-10-CM | POA: Diagnosis not present

## 2016-05-27 DIAGNOSIS — H353134 Nonexudative age-related macular degeneration, bilateral, advanced atrophic with subfoveal involvement: Secondary | ICD-10-CM | POA: Diagnosis not present

## 2016-05-27 DIAGNOSIS — H35359 Cystoid macular degeneration, unspecified eye: Secondary | ICD-10-CM | POA: Diagnosis not present

## 2016-05-27 DIAGNOSIS — H353211 Exudative age-related macular degeneration, right eye, with active choroidal neovascularization: Secondary | ICD-10-CM | POA: Diagnosis not present

## 2016-05-27 DIAGNOSIS — H353221 Exudative age-related macular degeneration, left eye, with active choroidal neovascularization: Secondary | ICD-10-CM | POA: Diagnosis not present

## 2016-05-27 DIAGNOSIS — H43392 Other vitreous opacities, left eye: Secondary | ICD-10-CM | POA: Diagnosis not present

## 2016-06-24 DIAGNOSIS — N8111 Cystocele, midline: Secondary | ICD-10-CM | POA: Diagnosis not present

## 2016-07-01 DIAGNOSIS — H353221 Exudative age-related macular degeneration, left eye, with active choroidal neovascularization: Secondary | ICD-10-CM | POA: Diagnosis not present

## 2016-07-01 DIAGNOSIS — H353211 Exudative age-related macular degeneration, right eye, with active choroidal neovascularization: Secondary | ICD-10-CM | POA: Diagnosis not present

## 2016-07-01 DIAGNOSIS — H353113 Nonexudative age-related macular degeneration, right eye, advanced atrophic without subfoveal involvement: Secondary | ICD-10-CM | POA: Diagnosis not present

## 2016-07-01 DIAGNOSIS — H353123 Nonexudative age-related macular degeneration, left eye, advanced atrophic without subfoveal involvement: Secondary | ICD-10-CM | POA: Diagnosis not present

## 2016-08-05 DIAGNOSIS — M25561 Pain in right knee: Secondary | ICD-10-CM | POA: Diagnosis not present

## 2016-08-05 DIAGNOSIS — M25562 Pain in left knee: Secondary | ICD-10-CM | POA: Diagnosis not present

## 2016-08-05 DIAGNOSIS — G8929 Other chronic pain: Secondary | ICD-10-CM | POA: Diagnosis not present

## 2016-08-12 DIAGNOSIS — Z0000 Encounter for general adult medical examination without abnormal findings: Secondary | ICD-10-CM | POA: Diagnosis not present

## 2016-08-12 DIAGNOSIS — I340 Nonrheumatic mitral (valve) insufficiency: Secondary | ICD-10-CM | POA: Diagnosis not present

## 2016-08-12 DIAGNOSIS — E119 Type 2 diabetes mellitus without complications: Secondary | ICD-10-CM | POA: Diagnosis not present

## 2016-08-12 DIAGNOSIS — E1122 Type 2 diabetes mellitus with diabetic chronic kidney disease: Secondary | ICD-10-CM | POA: Diagnosis not present

## 2016-08-12 DIAGNOSIS — D649 Anemia, unspecified: Secondary | ICD-10-CM | POA: Diagnosis not present

## 2016-08-12 DIAGNOSIS — N08 Glomerular disorders in diseases classified elsewhere: Secondary | ICD-10-CM | POA: Diagnosis not present

## 2016-08-12 DIAGNOSIS — M1990 Unspecified osteoarthritis, unspecified site: Secondary | ICD-10-CM | POA: Diagnosis not present

## 2016-08-12 DIAGNOSIS — I1310 Hypertensive heart and chronic kidney disease without heart failure, with stage 1 through stage 4 chronic kidney disease, or unspecified chronic kidney disease: Secondary | ICD-10-CM | POA: Diagnosis not present

## 2016-08-12 DIAGNOSIS — I119 Hypertensive heart disease without heart failure: Secondary | ICD-10-CM | POA: Diagnosis not present

## 2016-08-12 DIAGNOSIS — I38 Endocarditis, valve unspecified: Secondary | ICD-10-CM | POA: Diagnosis not present

## 2016-08-12 DIAGNOSIS — E559 Vitamin D deficiency, unspecified: Secondary | ICD-10-CM | POA: Diagnosis not present

## 2016-08-12 DIAGNOSIS — N182 Chronic kidney disease, stage 2 (mild): Secondary | ICD-10-CM | POA: Diagnosis not present

## 2016-08-12 DIAGNOSIS — E785 Hyperlipidemia, unspecified: Secondary | ICD-10-CM | POA: Diagnosis not present

## 2016-08-15 DIAGNOSIS — H353113 Nonexudative age-related macular degeneration, right eye, advanced atrophic without subfoveal involvement: Secondary | ICD-10-CM | POA: Diagnosis not present

## 2016-08-15 DIAGNOSIS — H353222 Exudative age-related macular degeneration, left eye, with inactive choroidal neovascularization: Secondary | ICD-10-CM | POA: Diagnosis not present

## 2016-08-15 DIAGNOSIS — H353212 Exudative age-related macular degeneration, right eye, with inactive choroidal neovascularization: Secondary | ICD-10-CM | POA: Diagnosis not present

## 2016-08-15 DIAGNOSIS — H353123 Nonexudative age-related macular degeneration, left eye, advanced atrophic without subfoveal involvement: Secondary | ICD-10-CM | POA: Diagnosis not present

## 2016-08-19 DIAGNOSIS — N8111 Cystocele, midline: Secondary | ICD-10-CM | POA: Diagnosis not present

## 2016-10-07 DIAGNOSIS — H353123 Nonexudative age-related macular degeneration, left eye, advanced atrophic without subfoveal involvement: Secondary | ICD-10-CM | POA: Diagnosis not present

## 2016-10-07 DIAGNOSIS — H353212 Exudative age-related macular degeneration, right eye, with inactive choroidal neovascularization: Secondary | ICD-10-CM | POA: Diagnosis not present

## 2016-10-07 DIAGNOSIS — H353113 Nonexudative age-related macular degeneration, right eye, advanced atrophic without subfoveal involvement: Secondary | ICD-10-CM | POA: Diagnosis not present

## 2016-10-07 DIAGNOSIS — H353222 Exudative age-related macular degeneration, left eye, with inactive choroidal neovascularization: Secondary | ICD-10-CM | POA: Diagnosis not present

## 2016-11-11 DIAGNOSIS — I38 Endocarditis, valve unspecified: Secondary | ICD-10-CM | POA: Diagnosis not present

## 2016-11-11 DIAGNOSIS — I119 Hypertensive heart disease without heart failure: Secondary | ICD-10-CM | POA: Diagnosis not present

## 2016-11-11 DIAGNOSIS — E119 Type 2 diabetes mellitus without complications: Secondary | ICD-10-CM | POA: Diagnosis not present

## 2016-11-11 DIAGNOSIS — I5020 Unspecified systolic (congestive) heart failure: Secondary | ICD-10-CM | POA: Diagnosis not present

## 2016-11-11 DIAGNOSIS — E785 Hyperlipidemia, unspecified: Secondary | ICD-10-CM | POA: Diagnosis not present

## 2016-11-11 DIAGNOSIS — M1990 Unspecified osteoarthritis, unspecified site: Secondary | ICD-10-CM | POA: Diagnosis not present

## 2016-11-11 DIAGNOSIS — I340 Nonrheumatic mitral (valve) insufficiency: Secondary | ICD-10-CM | POA: Diagnosis not present

## 2016-11-19 DIAGNOSIS — Z23 Encounter for immunization: Secondary | ICD-10-CM | POA: Diagnosis not present

## 2016-11-19 DIAGNOSIS — N8111 Cystocele, midline: Secondary | ICD-10-CM | POA: Diagnosis not present

## 2016-12-02 DIAGNOSIS — M170 Bilateral primary osteoarthritis of knee: Secondary | ICD-10-CM | POA: Diagnosis not present

## 2016-12-02 DIAGNOSIS — G8929 Other chronic pain: Secondary | ICD-10-CM | POA: Diagnosis not present

## 2016-12-02 DIAGNOSIS — M1711 Unilateral primary osteoarthritis, right knee: Secondary | ICD-10-CM | POA: Diagnosis not present

## 2016-12-02 DIAGNOSIS — M1712 Unilateral primary osteoarthritis, left knee: Secondary | ICD-10-CM | POA: Diagnosis not present

## 2016-12-02 DIAGNOSIS — M25561 Pain in right knee: Secondary | ICD-10-CM | POA: Diagnosis not present

## 2016-12-09 ENCOUNTER — Encounter: Payer: Medicare Other | Admitting: Podiatry

## 2016-12-09 DIAGNOSIS — I340 Nonrheumatic mitral (valve) insufficiency: Secondary | ICD-10-CM | POA: Diagnosis not present

## 2016-12-09 DIAGNOSIS — E785 Hyperlipidemia, unspecified: Secondary | ICD-10-CM | POA: Diagnosis not present

## 2016-12-09 DIAGNOSIS — E119 Type 2 diabetes mellitus without complications: Secondary | ICD-10-CM | POA: Diagnosis not present

## 2016-12-09 DIAGNOSIS — I38 Endocarditis, valve unspecified: Secondary | ICD-10-CM | POA: Diagnosis not present

## 2016-12-09 DIAGNOSIS — I119 Hypertensive heart disease without heart failure: Secondary | ICD-10-CM | POA: Diagnosis not present

## 2016-12-09 DIAGNOSIS — M1990 Unspecified osteoarthritis, unspecified site: Secondary | ICD-10-CM | POA: Diagnosis not present

## 2016-12-09 DIAGNOSIS — J209 Acute bronchitis, unspecified: Secondary | ICD-10-CM | POA: Diagnosis not present

## 2016-12-09 DIAGNOSIS — I5020 Unspecified systolic (congestive) heart failure: Secondary | ICD-10-CM | POA: Diagnosis not present

## 2016-12-09 NOTE — Patient Instructions (Signed)

## 2017-01-15 DIAGNOSIS — H353113 Nonexudative age-related macular degeneration, right eye, advanced atrophic without subfoveal involvement: Secondary | ICD-10-CM | POA: Diagnosis not present

## 2017-01-15 DIAGNOSIS — H353222 Exudative age-related macular degeneration, left eye, with inactive choroidal neovascularization: Secondary | ICD-10-CM | POA: Diagnosis not present

## 2017-01-15 DIAGNOSIS — H353123 Nonexudative age-related macular degeneration, left eye, advanced atrophic without subfoveal involvement: Secondary | ICD-10-CM | POA: Diagnosis not present

## 2017-01-15 DIAGNOSIS — H353211 Exudative age-related macular degeneration, right eye, with active choroidal neovascularization: Secondary | ICD-10-CM | POA: Diagnosis not present

## 2017-01-21 DIAGNOSIS — N8111 Cystocele, midline: Secondary | ICD-10-CM | POA: Diagnosis not present

## 2017-01-27 DIAGNOSIS — N08 Glomerular disorders in diseases classified elsewhere: Secondary | ICD-10-CM | POA: Diagnosis not present

## 2017-01-27 DIAGNOSIS — E1122 Type 2 diabetes mellitus with diabetic chronic kidney disease: Secondary | ICD-10-CM | POA: Diagnosis not present

## 2017-01-27 DIAGNOSIS — N182 Chronic kidney disease, stage 2 (mild): Secondary | ICD-10-CM | POA: Diagnosis not present

## 2017-01-27 DIAGNOSIS — I129 Hypertensive chronic kidney disease with stage 1 through stage 4 chronic kidney disease, or unspecified chronic kidney disease: Secondary | ICD-10-CM | POA: Diagnosis not present

## 2017-02-10 DIAGNOSIS — I119 Hypertensive heart disease without heart failure: Secondary | ICD-10-CM | POA: Diagnosis not present

## 2017-02-10 DIAGNOSIS — E119 Type 2 diabetes mellitus without complications: Secondary | ICD-10-CM | POA: Diagnosis not present

## 2017-02-10 DIAGNOSIS — I340 Nonrheumatic mitral (valve) insufficiency: Secondary | ICD-10-CM | POA: Diagnosis not present

## 2017-02-10 DIAGNOSIS — E785 Hyperlipidemia, unspecified: Secondary | ICD-10-CM | POA: Diagnosis not present

## 2017-02-10 DIAGNOSIS — I5020 Unspecified systolic (congestive) heart failure: Secondary | ICD-10-CM | POA: Diagnosis not present

## 2017-02-10 DIAGNOSIS — I38 Endocarditis, valve unspecified: Secondary | ICD-10-CM | POA: Diagnosis not present

## 2017-02-10 DIAGNOSIS — M1990 Unspecified osteoarthritis, unspecified site: Secondary | ICD-10-CM | POA: Diagnosis not present

## 2017-02-24 DIAGNOSIS — H35359 Cystoid macular degeneration, unspecified eye: Secondary | ICD-10-CM | POA: Diagnosis not present

## 2017-02-24 DIAGNOSIS — H353123 Nonexudative age-related macular degeneration, left eye, advanced atrophic without subfoveal involvement: Secondary | ICD-10-CM | POA: Diagnosis not present

## 2017-02-24 DIAGNOSIS — H353221 Exudative age-related macular degeneration, left eye, with active choroidal neovascularization: Secondary | ICD-10-CM | POA: Diagnosis not present

## 2017-02-24 DIAGNOSIS — H353211 Exudative age-related macular degeneration, right eye, with active choroidal neovascularization: Secondary | ICD-10-CM | POA: Diagnosis not present

## 2017-02-24 DIAGNOSIS — H353113 Nonexudative age-related macular degeneration, right eye, advanced atrophic without subfoveal involvement: Secondary | ICD-10-CM | POA: Diagnosis not present

## 2017-03-03 DIAGNOSIS — H353211 Exudative age-related macular degeneration, right eye, with active choroidal neovascularization: Secondary | ICD-10-CM | POA: Diagnosis not present

## 2017-03-17 DIAGNOSIS — N8111 Cystocele, midline: Secondary | ICD-10-CM | POA: Diagnosis not present

## 2017-04-07 DIAGNOSIS — H353221 Exudative age-related macular degeneration, left eye, with active choroidal neovascularization: Secondary | ICD-10-CM | POA: Diagnosis not present

## 2017-04-14 DIAGNOSIS — M1711 Unilateral primary osteoarthritis, right knee: Secondary | ICD-10-CM | POA: Diagnosis not present

## 2017-04-14 DIAGNOSIS — E785 Hyperlipidemia, unspecified: Secondary | ICD-10-CM | POA: Diagnosis not present

## 2017-04-14 DIAGNOSIS — M1712 Unilateral primary osteoarthritis, left knee: Secondary | ICD-10-CM | POA: Diagnosis not present

## 2017-04-14 DIAGNOSIS — I10 Essential (primary) hypertension: Secondary | ICD-10-CM | POA: Diagnosis not present

## 2017-04-18 DIAGNOSIS — Z1231 Encounter for screening mammogram for malignant neoplasm of breast: Secondary | ICD-10-CM | POA: Diagnosis not present

## 2017-04-18 DIAGNOSIS — Z803 Family history of malignant neoplasm of breast: Secondary | ICD-10-CM | POA: Diagnosis not present

## 2017-05-08 DIAGNOSIS — H43392 Other vitreous opacities, left eye: Secondary | ICD-10-CM | POA: Diagnosis not present

## 2017-05-08 DIAGNOSIS — H353211 Exudative age-related macular degeneration, right eye, with active choroidal neovascularization: Secondary | ICD-10-CM | POA: Diagnosis not present

## 2017-05-08 DIAGNOSIS — H353113 Nonexudative age-related macular degeneration, right eye, advanced atrophic without subfoveal involvement: Secondary | ICD-10-CM | POA: Diagnosis not present

## 2017-05-08 DIAGNOSIS — H353222 Exudative age-related macular degeneration, left eye, with inactive choroidal neovascularization: Secondary | ICD-10-CM | POA: Diagnosis not present

## 2017-05-08 DIAGNOSIS — H353221 Exudative age-related macular degeneration, left eye, with active choroidal neovascularization: Secondary | ICD-10-CM | POA: Diagnosis not present

## 2017-05-08 DIAGNOSIS — H353123 Nonexudative age-related macular degeneration, left eye, advanced atrophic without subfoveal involvement: Secondary | ICD-10-CM | POA: Diagnosis not present

## 2017-05-12 DIAGNOSIS — E785 Hyperlipidemia, unspecified: Secondary | ICD-10-CM | POA: Diagnosis not present

## 2017-05-12 DIAGNOSIS — I119 Hypertensive heart disease without heart failure: Secondary | ICD-10-CM | POA: Diagnosis not present

## 2017-05-12 DIAGNOSIS — I38 Endocarditis, valve unspecified: Secondary | ICD-10-CM | POA: Diagnosis not present

## 2017-05-12 DIAGNOSIS — I5020 Unspecified systolic (congestive) heart failure: Secondary | ICD-10-CM | POA: Diagnosis not present

## 2017-05-12 DIAGNOSIS — I340 Nonrheumatic mitral (valve) insufficiency: Secondary | ICD-10-CM | POA: Diagnosis not present

## 2017-05-12 DIAGNOSIS — M1990 Unspecified osteoarthritis, unspecified site: Secondary | ICD-10-CM | POA: Diagnosis not present

## 2017-05-12 DIAGNOSIS — E119 Type 2 diabetes mellitus without complications: Secondary | ICD-10-CM | POA: Diagnosis not present

## 2017-05-19 DIAGNOSIS — H353113 Nonexudative age-related macular degeneration, right eye, advanced atrophic without subfoveal involvement: Secondary | ICD-10-CM | POA: Diagnosis not present

## 2017-05-19 DIAGNOSIS — H353211 Exudative age-related macular degeneration, right eye, with active choroidal neovascularization: Secondary | ICD-10-CM | POA: Diagnosis not present

## 2017-05-19 DIAGNOSIS — H353221 Exudative age-related macular degeneration, left eye, with active choroidal neovascularization: Secondary | ICD-10-CM | POA: Diagnosis not present

## 2017-05-19 DIAGNOSIS — H35359 Cystoid macular degeneration, unspecified eye: Secondary | ICD-10-CM | POA: Diagnosis not present

## 2017-05-19 DIAGNOSIS — H353222 Exudative age-related macular degeneration, left eye, with inactive choroidal neovascularization: Secondary | ICD-10-CM | POA: Diagnosis not present

## 2017-05-20 DIAGNOSIS — N182 Chronic kidney disease, stage 2 (mild): Secondary | ICD-10-CM | POA: Diagnosis not present

## 2017-05-20 DIAGNOSIS — N08 Glomerular disorders in diseases classified elsewhere: Secondary | ICD-10-CM | POA: Diagnosis not present

## 2017-05-20 DIAGNOSIS — N390 Urinary tract infection, site not specified: Secondary | ICD-10-CM | POA: Diagnosis not present

## 2017-05-20 DIAGNOSIS — E1122 Type 2 diabetes mellitus with diabetic chronic kidney disease: Secondary | ICD-10-CM | POA: Diagnosis not present

## 2017-05-22 NOTE — Progress Notes (Signed)
This encounter was created in error - please disregard.

## 2017-06-13 DIAGNOSIS — N8111 Cystocele, midline: Secondary | ICD-10-CM | POA: Diagnosis not present

## 2017-06-26 DIAGNOSIS — R350 Frequency of micturition: Secondary | ICD-10-CM | POA: Diagnosis not present

## 2017-06-26 DIAGNOSIS — R109 Unspecified abdominal pain: Secondary | ICD-10-CM | POA: Diagnosis not present

## 2017-06-26 DIAGNOSIS — N289 Disorder of kidney and ureter, unspecified: Secondary | ICD-10-CM | POA: Diagnosis not present

## 2017-06-30 DIAGNOSIS — H353221 Exudative age-related macular degeneration, left eye, with active choroidal neovascularization: Secondary | ICD-10-CM | POA: Diagnosis not present

## 2017-06-30 DIAGNOSIS — H353211 Exudative age-related macular degeneration, right eye, with active choroidal neovascularization: Secondary | ICD-10-CM | POA: Diagnosis not present

## 2017-06-30 DIAGNOSIS — H353113 Nonexudative age-related macular degeneration, right eye, advanced atrophic without subfoveal involvement: Secondary | ICD-10-CM | POA: Diagnosis not present

## 2017-06-30 DIAGNOSIS — H353123 Nonexudative age-related macular degeneration, left eye, advanced atrophic without subfoveal involvement: Secondary | ICD-10-CM | POA: Diagnosis not present

## 2017-07-04 ENCOUNTER — Other Ambulatory Visit: Payer: Self-pay | Admitting: Internal Medicine

## 2017-07-04 DIAGNOSIS — R1084 Generalized abdominal pain: Secondary | ICD-10-CM

## 2017-07-07 DIAGNOSIS — H353123 Nonexudative age-related macular degeneration, left eye, advanced atrophic without subfoveal involvement: Secondary | ICD-10-CM | POA: Diagnosis not present

## 2017-07-07 DIAGNOSIS — H353113 Nonexudative age-related macular degeneration, right eye, advanced atrophic without subfoveal involvement: Secondary | ICD-10-CM | POA: Diagnosis not present

## 2017-07-07 DIAGNOSIS — H353211 Exudative age-related macular degeneration, right eye, with active choroidal neovascularization: Secondary | ICD-10-CM | POA: Diagnosis not present

## 2017-07-15 ENCOUNTER — Ambulatory Visit
Admission: RE | Admit: 2017-07-15 | Discharge: 2017-07-15 | Disposition: A | Discharge status: Home / Self Care | Payer: Medicare Other | Source: Ambulatory Visit | Attending: Internal Medicine | Admitting: Internal Medicine

## 2017-07-15 DIAGNOSIS — R1084 Generalized abdominal pain: Secondary | ICD-10-CM

## 2017-08-04 DIAGNOSIS — M1712 Unilateral primary osteoarthritis, left knee: Secondary | ICD-10-CM | POA: Diagnosis not present

## 2017-08-04 DIAGNOSIS — M1711 Unilateral primary osteoarthritis, right knee: Secondary | ICD-10-CM | POA: Diagnosis not present

## 2017-08-08 DIAGNOSIS — N8111 Cystocele, midline: Secondary | ICD-10-CM | POA: Diagnosis not present

## 2017-08-11 DIAGNOSIS — I10 Essential (primary) hypertension: Secondary | ICD-10-CM | POA: Diagnosis not present

## 2017-08-11 DIAGNOSIS — I38 Endocarditis, valve unspecified: Secondary | ICD-10-CM | POA: Diagnosis not present

## 2017-08-11 DIAGNOSIS — M1990 Unspecified osteoarthritis, unspecified site: Secondary | ICD-10-CM | POA: Diagnosis not present

## 2017-08-11 DIAGNOSIS — I340 Nonrheumatic mitral (valve) insufficiency: Secondary | ICD-10-CM | POA: Diagnosis not present

## 2017-08-11 DIAGNOSIS — E785 Hyperlipidemia, unspecified: Secondary | ICD-10-CM | POA: Diagnosis not present

## 2017-08-11 DIAGNOSIS — I5020 Unspecified systolic (congestive) heart failure: Secondary | ICD-10-CM | POA: Diagnosis not present

## 2017-08-11 DIAGNOSIS — E119 Type 2 diabetes mellitus without complications: Secondary | ICD-10-CM | POA: Diagnosis not present

## 2017-08-18 DIAGNOSIS — H353123 Nonexudative age-related macular degeneration, left eye, advanced atrophic without subfoveal involvement: Secondary | ICD-10-CM | POA: Diagnosis not present

## 2017-08-18 DIAGNOSIS — H353221 Exudative age-related macular degeneration, left eye, with active choroidal neovascularization: Secondary | ICD-10-CM | POA: Diagnosis not present

## 2017-09-15 DIAGNOSIS — H353211 Exudative age-related macular degeneration, right eye, with active choroidal neovascularization: Secondary | ICD-10-CM | POA: Diagnosis not present

## 2017-09-15 DIAGNOSIS — H353113 Nonexudative age-related macular degeneration, right eye, advanced atrophic without subfoveal involvement: Secondary | ICD-10-CM | POA: Diagnosis not present

## 2017-09-17 DIAGNOSIS — Z1211 Encounter for screening for malignant neoplasm of colon: Secondary | ICD-10-CM | POA: Diagnosis not present

## 2017-09-17 DIAGNOSIS — R7309 Other abnormal glucose: Secondary | ICD-10-CM | POA: Diagnosis not present

## 2017-09-17 DIAGNOSIS — I1310 Hypertensive heart and chronic kidney disease without heart failure, with stage 1 through stage 4 chronic kidney disease, or unspecified chronic kidney disease: Secondary | ICD-10-CM | POA: Diagnosis not present

## 2017-09-17 DIAGNOSIS — E559 Vitamin D deficiency, unspecified: Secondary | ICD-10-CM | POA: Diagnosis not present

## 2017-09-17 DIAGNOSIS — N390 Urinary tract infection, site not specified: Secondary | ICD-10-CM

## 2017-09-17 DIAGNOSIS — E1122 Type 2 diabetes mellitus with diabetic chronic kidney disease: Secondary | ICD-10-CM | POA: Diagnosis not present

## 2017-09-17 DIAGNOSIS — Z79899 Other long term (current) drug therapy: Secondary | ICD-10-CM | POA: Diagnosis not present

## 2017-09-17 DIAGNOSIS — E785 Hyperlipidemia, unspecified: Secondary | ICD-10-CM | POA: Diagnosis not present

## 2017-09-17 DIAGNOSIS — Z0000 Encounter for general adult medical examination without abnormal findings: Secondary | ICD-10-CM

## 2017-09-17 DIAGNOSIS — N182 Chronic kidney disease, stage 2 (mild): Secondary | ICD-10-CM | POA: Diagnosis not present

## 2017-09-17 DIAGNOSIS — Z1212 Encounter for screening for malignant neoplasm of rectum: Secondary | ICD-10-CM | POA: Diagnosis not present

## 2017-09-17 DIAGNOSIS — R9431 Abnormal electrocardiogram [ECG] [EKG]: Secondary | ICD-10-CM

## 2017-09-18 DIAGNOSIS — N390 Urinary tract infection, site not specified: Secondary | ICD-10-CM | POA: Diagnosis not present

## 2017-09-22 DIAGNOSIS — E119 Type 2 diabetes mellitus without complications: Secondary | ICD-10-CM | POA: Diagnosis not present

## 2017-09-22 DIAGNOSIS — D649 Anemia, unspecified: Secondary | ICD-10-CM | POA: Diagnosis not present

## 2017-09-22 DIAGNOSIS — I340 Nonrheumatic mitral (valve) insufficiency: Secondary | ICD-10-CM | POA: Diagnosis not present

## 2017-09-22 DIAGNOSIS — I10 Essential (primary) hypertension: Secondary | ICD-10-CM | POA: Diagnosis not present

## 2017-09-22 DIAGNOSIS — I38 Endocarditis, valve unspecified: Secondary | ICD-10-CM | POA: Diagnosis not present

## 2017-09-22 DIAGNOSIS — R0609 Other forms of dyspnea: Secondary | ICD-10-CM | POA: Diagnosis not present

## 2017-09-22 DIAGNOSIS — I5020 Unspecified systolic (congestive) heart failure: Secondary | ICD-10-CM | POA: Diagnosis not present

## 2017-09-22 DIAGNOSIS — E785 Hyperlipidemia, unspecified: Secondary | ICD-10-CM | POA: Diagnosis not present

## 2017-09-22 DIAGNOSIS — M1990 Unspecified osteoarthritis, unspecified site: Secondary | ICD-10-CM | POA: Diagnosis not present

## 2017-09-23 DIAGNOSIS — I5020 Unspecified systolic (congestive) heart failure: Secondary | ICD-10-CM | POA: Diagnosis not present

## 2017-09-23 DIAGNOSIS — R0609 Other forms of dyspnea: Secondary | ICD-10-CM | POA: Diagnosis not present

## 2017-09-23 DIAGNOSIS — E119 Type 2 diabetes mellitus without complications: Secondary | ICD-10-CM | POA: Diagnosis not present

## 2017-09-23 DIAGNOSIS — I340 Nonrheumatic mitral (valve) insufficiency: Secondary | ICD-10-CM | POA: Diagnosis not present

## 2017-09-23 DIAGNOSIS — I38 Endocarditis, valve unspecified: Secondary | ICD-10-CM | POA: Diagnosis not present

## 2017-09-23 DIAGNOSIS — D649 Anemia, unspecified: Secondary | ICD-10-CM | POA: Diagnosis not present

## 2017-09-23 DIAGNOSIS — I10 Essential (primary) hypertension: Secondary | ICD-10-CM | POA: Diagnosis not present

## 2017-09-23 DIAGNOSIS — M1990 Unspecified osteoarthritis, unspecified site: Secondary | ICD-10-CM | POA: Diagnosis not present

## 2017-10-06 DIAGNOSIS — H353221 Exudative age-related macular degeneration, left eye, with active choroidal neovascularization: Secondary | ICD-10-CM | POA: Diagnosis not present

## 2017-10-06 DIAGNOSIS — H353123 Nonexudative age-related macular degeneration, left eye, advanced atrophic without subfoveal involvement: Secondary | ICD-10-CM | POA: Diagnosis not present

## 2017-10-06 DIAGNOSIS — H353211 Exudative age-related macular degeneration, right eye, with active choroidal neovascularization: Secondary | ICD-10-CM | POA: Diagnosis not present

## 2017-10-15 DIAGNOSIS — N8111 Cystocele, midline: Secondary | ICD-10-CM | POA: Diagnosis not present

## 2017-10-29 ENCOUNTER — Ambulatory Visit (INDEPENDENT_AMBULATORY_CARE_PROVIDER_SITE_OTHER): Payer: Medicare Other | Admitting: Podiatry

## 2017-10-29 ENCOUNTER — Encounter: Payer: Self-pay | Admitting: Podiatry

## 2017-10-29 DIAGNOSIS — B351 Tinea unguium: Secondary | ICD-10-CM

## 2017-10-29 DIAGNOSIS — M79674 Pain in right toe(s): Secondary | ICD-10-CM

## 2017-10-29 DIAGNOSIS — L600 Ingrowing nail: Secondary | ICD-10-CM | POA: Diagnosis not present

## 2017-10-30 NOTE — Progress Notes (Signed)
Subjective:   Patient ID: Mariah Aguilar, female   DOB: 84 y.o.   MRN: [id number]   HPI Patient presents with a significant thickness of the right big toenail and also is concerned about the ingrown component of this and wants to know about review removal and wants to be educated on what would be her best process is going forward   ROS      Objective:  Physical Exam  Neurovascular status found to be moderately diminished but intact from previous visit with digital perfusion noted to be within normal limits for her age.  Patient is found to have a thickened right hallux nail that is also incurvated in the corner and is moderately painful when palpated and she is having difficulty wearing shoe gear     Assessment:  Combination of a chronic ingrown toenail deformity right with mycotic component right     Plan:  Spent a great deal of time educating her on ingrown toenails and consideration conservatively and surgically.  At this point working to take a conservative trimming path but I did discuss with her a procedure to remove the nailbed and reviewed the healing course associated with this.  Patient will be seen back and we will discuss this in greater detail

## 2017-10-31 DIAGNOSIS — M1712 Unilateral primary osteoarthritis, left knee: Secondary | ICD-10-CM | POA: Diagnosis not present

## 2017-10-31 DIAGNOSIS — M1711 Unilateral primary osteoarthritis, right knee: Secondary | ICD-10-CM | POA: Diagnosis not present

## 2017-11-10 DIAGNOSIS — I38 Endocarditis, valve unspecified: Secondary | ICD-10-CM | POA: Diagnosis not present

## 2017-11-10 DIAGNOSIS — E119 Type 2 diabetes mellitus without complications: Secondary | ICD-10-CM | POA: Diagnosis not present

## 2017-11-10 DIAGNOSIS — M1990 Unspecified osteoarthritis, unspecified site: Secondary | ICD-10-CM | POA: Diagnosis not present

## 2017-11-10 DIAGNOSIS — E785 Hyperlipidemia, unspecified: Secondary | ICD-10-CM | POA: Diagnosis not present

## 2017-11-10 DIAGNOSIS — R001 Bradycardia, unspecified: Secondary | ICD-10-CM | POA: Diagnosis not present

## 2017-11-10 DIAGNOSIS — I340 Nonrheumatic mitral (valve) insufficiency: Secondary | ICD-10-CM | POA: Diagnosis not present

## 2017-11-10 DIAGNOSIS — I5020 Unspecified systolic (congestive) heart failure: Secondary | ICD-10-CM | POA: Diagnosis not present

## 2017-11-10 DIAGNOSIS — I10 Essential (primary) hypertension: Secondary | ICD-10-CM | POA: Diagnosis not present

## 2017-11-10 DIAGNOSIS — D649 Anemia, unspecified: Secondary | ICD-10-CM | POA: Diagnosis not present

## 2017-11-12 DIAGNOSIS — H524 Presbyopia: Secondary | ICD-10-CM | POA: Diagnosis not present

## 2017-11-12 DIAGNOSIS — H52203 Unspecified astigmatism, bilateral: Secondary | ICD-10-CM | POA: Diagnosis not present

## 2017-11-12 DIAGNOSIS — H5203 Hypermetropia, bilateral: Secondary | ICD-10-CM | POA: Diagnosis not present

## 2017-11-14 DIAGNOSIS — H353211 Exudative age-related macular degeneration, right eye, with active choroidal neovascularization: Secondary | ICD-10-CM | POA: Diagnosis not present

## 2017-11-14 DIAGNOSIS — H35359 Cystoid macular degeneration, unspecified eye: Secondary | ICD-10-CM | POA: Diagnosis not present

## 2017-11-14 DIAGNOSIS — H353123 Nonexudative age-related macular degeneration, left eye, advanced atrophic without subfoveal involvement: Secondary | ICD-10-CM | POA: Diagnosis not present

## 2017-11-14 DIAGNOSIS — H353113 Nonexudative age-related macular degeneration, right eye, advanced atrophic without subfoveal involvement: Secondary | ICD-10-CM | POA: Diagnosis not present

## 2017-11-14 DIAGNOSIS — H353221 Exudative age-related macular degeneration, left eye, with active choroidal neovascularization: Secondary | ICD-10-CM | POA: Diagnosis not present

## 2017-11-28 DIAGNOSIS — H353123 Nonexudative age-related macular degeneration, left eye, advanced atrophic without subfoveal involvement: Secondary | ICD-10-CM | POA: Diagnosis not present

## 2017-11-28 DIAGNOSIS — H353221 Exudative age-related macular degeneration, left eye, with active choroidal neovascularization: Secondary | ICD-10-CM | POA: Diagnosis not present

## 2017-12-17 DIAGNOSIS — N8111 Cystocele, midline: Secondary | ICD-10-CM | POA: Diagnosis not present

## 2017-12-24 ENCOUNTER — Ambulatory Visit (INDEPENDENT_AMBULATORY_CARE_PROVIDER_SITE_OTHER): Payer: Medicare Other | Admitting: Internal Medicine

## 2017-12-24 ENCOUNTER — Encounter: Payer: Self-pay | Admitting: Internal Medicine

## 2017-12-24 VITALS — BP 130/86 | HR 71 | Temp 97.7°F | Ht 63.0 in | Wt 140.0 lb

## 2017-12-24 DIAGNOSIS — E1122 Type 2 diabetes mellitus with diabetic chronic kidney disease: Secondary | ICD-10-CM

## 2017-12-24 DIAGNOSIS — Z23 Encounter for immunization: Secondary | ICD-10-CM | POA: Diagnosis not present

## 2017-12-24 DIAGNOSIS — E2839 Other primary ovarian failure: Secondary | ICD-10-CM

## 2017-12-24 DIAGNOSIS — I129 Hypertensive chronic kidney disease with stage 1 through stage 4 chronic kidney disease, or unspecified chronic kidney disease: Secondary | ICD-10-CM

## 2017-12-24 DIAGNOSIS — N182 Chronic kidney disease, stage 2 (mild): Secondary | ICD-10-CM

## 2017-12-24 LAB — CMP14+EGFR
A/G RATIO: 1.6 (ref 1.2–2.2)
ALT: 12 IU/L (ref 0–32)
AST: 19 IU/L (ref 0–40)
Albumin: 4.7 g/dL — ABNORMAL HIGH (ref 3.2–4.6)
Alkaline Phosphatase: 67 IU/L (ref 39–117)
BILIRUBIN TOTAL: 0.3 mg/dL (ref 0.0–1.2)
BUN/Creatinine Ratio: 17 (ref 12–28)
BUN: 13 mg/dL (ref 10–36)
CHLORIDE: 101 mmol/L (ref 96–106)
CO2: 23 mmol/L (ref 20–29)
Calcium: 9.1 mg/dL (ref 8.7–10.3)
Creatinine, Ser: 0.78 mg/dL (ref 0.57–1.00)
GFR calc Af Amer: 76 mL/min/{1.73_m2} (ref >59)
GFR, EST NON AFRICAN AMERICAN: 66 mL/min/{1.73_m2} (ref >59)
GLOBULIN, TOTAL: 2.9 g/dL (ref 1.5–4.5)
Glucose: 113 mg/dL — ABNORMAL HIGH (ref 65–99)
POTASSIUM: 4.5 mmol/L (ref 3.5–5.2)
SODIUM: 141 mmol/L (ref 134–144)
TOTAL PROTEIN: 7.6 g/dL (ref 6.0–8.5)

## 2017-12-24 LAB — HEMOGLOBIN A1C
Est. average glucose Bld gHb Est-mCnc: 140 mg/dL
Hgb A1c MFr Bld: 6.5 % — ABNORMAL HIGH (ref 4.8–5.6)

## 2017-12-24 NOTE — Patient Instructions (Signed)
DASH Eating Plan  DASH stands for "Dietary Approaches to Stop Hypertension." The DASH eating plan is a healthy eating plan that has been shown to reduce high blood pressure (hypertension). It may also reduce your risk for type 2 diabetes, heart disease, and stroke. The DASH eating plan may also help with weight loss.  What are tips for following this plan?    General guidelines   Avoid eating more than 2,300 mg (milligrams) of salt (sodium) a day. If you have hypertension, you may need to reduce your sodium intake to 1,500 mg a day.   Limit alcohol intake to no more than 1 drink a day for nonpregnant women and 2 drinks a day for men. One drink equals 12 oz of beer, 5 oz of wine, or 1 oz of hard liquor.   Work with your health care provider to maintain a healthy body weight or to lose weight. Ask what an ideal weight is for you.   Get at least 30 minutes of exercise that causes your heart to beat faster (aerobic exercise) most days of the week. Activities may include walking, swimming, or biking.   Work with your health care provider or diet and nutrition specialist (dietitian) to adjust your eating plan to your individual calorie needs.  Reading food labels     Check food labels for the amount of sodium per serving. Choose foods with less than 5 percent of the Daily Value of sodium. Generally, foods with less than 300 mg of sodium per serving fit into this eating plan.   To find whole grains, look for the word "whole" as the first word in the ingredient list.  Shopping   Buy products labeled as "low-sodium" or "no salt added."   Buy fresh foods. Avoid canned foods and premade or frozen meals.  Cooking   Avoid adding salt when cooking. Use salt-free seasonings or herbs instead of table salt or sea salt. Check with your health care provider or pharmacist before using salt substitutes.   Do not fry foods. Cook foods using healthy methods such as baking, boiling, grilling, and broiling instead.   Cook with  heart-healthy oils, such as olive, canola, soybean, or sunflower oil.  Meal planning   Eat a balanced diet that includes:  ? 5 or more servings of fruits and vegetables each day. At each meal, try to fill half of your plate with fruits and vegetables.  ? Up to 6-8 servings of whole grains each day.  ? Less than 6 oz of lean meat, poultry, or fish each day. A 3-oz serving of meat is about the same size as a deck of cards. One egg equals 1 oz.  ? 2 servings of low-fat dairy each day.  ? A serving of nuts, seeds, or beans 5 times each week.  ? Heart-healthy fats. Healthy fats called Omega-3 fatty acids are found in foods such as flaxseeds and coldwater fish, like sardines, salmon, and mackerel.   Limit how much you eat of the following:  ? Canned or prepackaged foods.  ? Food that is high in trans fat, such as fried foods.  ? Food that is high in saturated fat, such as fatty meat.  ? Sweets, desserts, sugary drinks, and other foods with added sugar.  ? Full-fat dairy products.   Do not salt foods before eating.   Try to eat at least 2 vegetarian meals each week.   Eat more home-cooked food and less restaurant, buffet, and fast food.     When eating at a restaurant, ask that your food be prepared with less salt or no salt, if possible.  What foods are recommended?  The items listed may not be a complete list. Talk with your dietitian about what dietary choices are best for you.  Grains  Whole-grain or whole-wheat bread. Whole-grain or whole-wheat pasta. Brown rice. Oatmeal. Quinoa. Bulgur. Whole-grain and low-sodium cereals. Pita bread. Low-fat, low-sodium crackers. Whole-wheat flour tortillas.  Vegetables  Fresh or frozen vegetables (raw, steamed, roasted, or grilled). Low-sodium or reduced-sodium tomato and vegetable juice. Low-sodium or reduced-sodium tomato sauce and tomato paste. Low-sodium or reduced-sodium canned vegetables.  Fruits  All fresh, dried, or frozen fruit. Canned fruit in natural juice (without  added sugar).  Meat and other protein foods  Skinless chicken or turkey. Ground chicken or turkey. Pork with fat trimmed off. Fish and seafood. Egg whites. Dried beans, peas, or lentils. Unsalted nuts, nut butters, and seeds. Unsalted canned beans. Lean cuts of beef with fat trimmed off. Low-sodium, lean deli meat.  Dairy  Low-fat (1%) or fat-free (skim) milk. Fat-free, low-fat, or reduced-fat cheeses. Nonfat, low-sodium ricotta or cottage cheese. Low-fat or nonfat yogurt. Low-fat, low-sodium cheese.  Fats and oils  Soft margarine without trans fats. Vegetable oil. Low-fat, reduced-fat, or light mayonnaise and salad dressings (reduced-sodium). Canola, safflower, olive, soybean, and sunflower oils. Avocado.  Seasoning and other foods  Herbs. Spices. Seasoning mixes without salt. Unsalted popcorn and pretzels. Fat-free sweets.  What foods are not recommended?  The items listed may not be a complete list. Talk with your dietitian about what dietary choices are best for you.  Grains  Baked goods made with fat, such as croissants, muffins, or some breads. Dry pasta or rice meal packs.  Vegetables  Creamed or fried vegetables. Vegetables in a cheese sauce. Regular canned vegetables (not low-sodium or reduced-sodium). Regular canned tomato sauce and paste (not low-sodium or reduced-sodium). Regular tomato and vegetable juice (not low-sodium or reduced-sodium). Pickles. Olives.  Fruits  Canned fruit in a light or heavy syrup. Fried fruit. Fruit in cream or butter sauce.  Meat and other protein foods  Fatty cuts of meat. Ribs. Fried meat. Bacon. Sausage. Bologna and other processed lunch meats. Salami. Fatback. Hotdogs. Bratwurst. Salted nuts and seeds. Canned beans with added salt. Canned or smoked fish. Whole eggs or egg yolks. Chicken or turkey with skin.  Dairy  Whole or 2% milk, cream, and half-and-half. Whole or full-fat cream cheese. Whole-fat or sweetened yogurt. Full-fat cheese. Nondairy creamers. Whipped toppings.  Processed cheese and cheese spreads.  Fats and oils  Butter. Stick margarine. Lard. Shortening. Ghee. Bacon fat. Tropical oils, such as coconut, palm kernel, or palm oil.  Seasoning and other foods  Salted popcorn and pretzels. Onion salt, garlic salt, seasoned salt, table salt, and sea salt. Worcestershire sauce. Tartar sauce. Barbecue sauce. Teriyaki sauce. Soy sauce, including reduced-sodium. Steak sauce. Canned and packaged gravies. Fish sauce. Oyster sauce. Cocktail sauce. Horseradish that you find on the shelf. Ketchup. Mustard. Meat flavorings and tenderizers. Bouillon cubes. Hot sauce and Tabasco sauce. Premade or packaged marinades. Premade or packaged taco seasonings. Relishes. Regular salad dressings.  Where to find more information:   National Heart, Lung, and Blood Institute: www.nhlbi.nih.gov   American Heart Association: www.heart.org  Summary   The DASH eating plan is a healthy eating plan that has been shown to reduce high blood pressure (hypertension). It may also reduce your risk for type 2 diabetes, heart disease, and stroke.   With the   DASH eating plan, you should limit salt (sodium) intake to 2,300 mg a day. If you have hypertension, you may need to reduce your sodium intake to 1,500 mg a day.   When on the DASH eating plan, aim to eat more fresh fruits and vegetables, whole grains, lean proteins, low-fat dairy, and heart-healthy fats.   Work with your health care provider or diet and nutrition specialist (dietitian) to adjust your eating plan to your individual calorie needs.  This information is not intended to replace advice given to you by your health care provider. Make sure you discuss any questions you have with your health care provider.  Document Released: 12/13/2010 Document Revised: 12/18/2015 Document Reviewed: 12/18/2015  Elsevier Interactive Patient Education  2019 [employer] Inc[employer]

## 2017-12-25 NOTE — Progress Notes (Signed)
Here are your lab results:  Your liver and kidney function are stable. Your hba1c is 6.5, this is great.   Happy holidays to you and your family!   Sincerely,    Alexzia Kasler N. [facility], MD

## 2018-01-05 NOTE — Progress Notes (Signed)
Subjective:     Patient ID: Mariah Aguilar , female    DOB: Jun 01, 1922 , 84 y.o.   MRN: [id number]   Chief Complaint  Patient presents with  . Diabetes  . Hypertension    HPI  Diabetes  She presents for her follow-up diabetic visit. She has type 2 diabetes mellitus. Her disease course has been stable. There are no hypoglycemic associated symptoms. Pertinent negatives for diabetes include no blurred vision and no chest pain. There are no hypoglycemic complications. Diabetic complications include nephropathy. Risk factors for coronary artery disease include diabetes mellitus, hypertension and post-menopausal.  Hypertension  This is a chronic problem. The current episode started more than 1 year ago. The problem has been gradually improving since onset. The problem is controlled. Pertinent negatives include no blurred vision or chest pain.   She reports compliance with meds.   Past Medical History:  Diagnosis Date  . Arthritis   . Cardiomyopathy   . Cecal neoplasm   . CHF (congestive heart failure) ([place])   . Colon polyps   . Diverticulosis   . Hyperlipidemia   . Hypertension   . Insomnia   . Internal hemorrhoids   . Left bundle branch block (LBBB) on electrocardiogram    EKG of 5'12 shows 1st degree AV block/LBBB -Epic.     Family History  Problem Relation Age of Onset  . Cancer Mother        COLON  . Stroke Father      Current Outpatient Medications:  .  amLODipine-valsartan (EXFORGE) 10-320 MG per tablet, , Disp: , Rfl:  .  carvedilol (COREG) 12.5 MG tablet, , Disp: , Rfl: 3 .  diazepam (VALIUM) 5 MG tablet, 2.5 mg. , Disp: , Rfl: 1 .  digoxin (LANOXIN) 0.125 MG tablet, Take 125 mcg by mouth daily., Disp: , Rfl: 3 .  esomeprazole (NEXIUM) 40 MG capsule, Take 40 mg by mouth daily., Disp: , Rfl:  .  furosemide (LASIX) 40 MG tablet, Take 40 mg by mouth 2 (two) times daily.  , Disp: , Rfl:  .  ibuprofen (ADVIL,MOTRIN) 600 MG tablet, Take 600 mg by mouth 3 (three)  times daily as needed., Disp: , Rfl: 0 .  Iron Polysacch Cmplx-B12-FA (NIFEREX-150 FORTE PO), Take by mouth., Disp: , Rfl:  .  potassium chloride (KLOR-CON) 8 MEQ CR tablet, Take 8 mEq by mouth daily.  , Disp: , Rfl:  .  Probiotic Product (RESTORA PO), Take by mouth., Disp: , Rfl:  .  rosuvastatin (CRESTOR) 10 MG tablet, Take 10 mg by mouth daily.  , Disp: , Rfl:  .  traMADol (ULTRAM) 50 MG tablet, TAKE 1 TABLET TWICE DAILY AS NEEDED FOR PAIN, Disp: , Rfl: 0 .  celecoxib (CELEBREX) 100 MG capsule, TAKE 1 CAPSULE (100 MG TOTAL) BY MOUTH DAILY. (Patient not taking: Reported on 12/24/2017), Disp: 30 capsule, Rfl: 2   Allergies  Allergen Reactions  . Penicillins      Review of Systems  Constitutional: Negative.   Eyes: Negative for blurred vision.  Respiratory: Negative.   Cardiovascular: Negative.  Negative for chest pain.  Gastrointestinal: Negative.   Neurological: Negative.   Psychiatric/Behavioral: Negative.      Today's Vitals   12/24/17 1014  BP: 130/86  Pulse: 71  Temp: 97.7 F (36.5 C)  TempSrc: Oral  Weight: 140 lb (63.5 kg)  Height: _0  (1.6 m)   Body mass index is 24.8 kg/m.   Objective:  Physical Exam Vitals signs and nursing  note reviewed.  Constitutional:      Appearance: Normal appearance.  HENT:     Head: Normocephalic.  Cardiovascular:     Rate and Rhythm: Normal rate and regular rhythm.     Heart sounds: Normal heart sounds.  Pulmonary:     Effort: Pulmonary effort is normal.     Breath sounds: Normal breath sounds.  Skin:    General: Skin is warm.  Neurological:     General: No focal deficit present.     Mental Status: She is alert.  Psychiatric:        Mood and Affect: Mood normal.         Assessment And Plan:     1. Type 2 diabetes mellitus with stage 2 chronic kidney disease, without long-term current use of insulin ([place])  She has been successful at controlling this with lifestyle changes. She is encouraged to continue with her  regular exercise regimen.   - CMP14+EGFR - Hemoglobin A1c  2. Chronic renal disease, stage II  Chronic. I will check GFR, Cr today. She is encouraged to stay well hydrated.   3. Parenchymal renal hypertension, stage 1 through stage 4 or unspecified chronic kidney disease  Controlled. She will continue with current meds. She is encouraged to limit her salt intake.   4. Estrogen deficiency  I will refer her to [facility] for a bone density exam. She is encouraged to engage in weight-bearing exercise three days weekly and continue with calcium and vitamin d supplementation.   - DG Bone Density; Future  5. Need for vaccination  - Flu vaccine HIGH DOSE PF (Fluzone High dose)        Maximino Greenland, MD

## 2018-01-20 ENCOUNTER — Ambulatory Visit (INDEPENDENT_AMBULATORY_CARE_PROVIDER_SITE_OTHER): Payer: Medicare Other | Admitting: Internal Medicine

## 2018-01-20 ENCOUNTER — Encounter: Payer: Self-pay | Admitting: Internal Medicine

## 2018-01-20 VITALS — BP 132/74 | HR 69 | Temp 97.4°F | Ht 63.0 in | Wt 130.0 lb

## 2018-01-20 DIAGNOSIS — J209 Acute bronchitis, unspecified: Secondary | ICD-10-CM

## 2018-01-20 MED ORDER — ALBUTEROL SULFATE (2.5 MG/3ML) 0.083% IN NEBU
2.5000 mg | INHALATION_SOLUTION | Freq: Once | RESPIRATORY_TRACT | Status: AC
  Administered 2018-01-20: 2.5 mg via RESPIRATORY_TRACT

## 2018-01-20 MED ORDER — TRIAMCINOLONE ACETONIDE 40 MG/ML IJ SUSP
40.0000 mg | Freq: Once | INTRAMUSCULAR | Status: AC
  Administered 2018-01-20: 40 mg via INTRAMUSCULAR

## 2018-01-20 NOTE — Patient Instructions (Signed)

## 2018-01-22 DIAGNOSIS — H353123 Nonexudative age-related macular degeneration, left eye, advanced atrophic without subfoveal involvement: Secondary | ICD-10-CM | POA: Diagnosis not present

## 2018-01-22 DIAGNOSIS — H353113 Nonexudative age-related macular degeneration, right eye, advanced atrophic without subfoveal involvement: Secondary | ICD-10-CM | POA: Diagnosis not present

## 2018-01-22 DIAGNOSIS — H353221 Exudative age-related macular degeneration, left eye, with active choroidal neovascularization: Secondary | ICD-10-CM | POA: Diagnosis not present

## 2018-01-22 DIAGNOSIS — H353211 Exudative age-related macular degeneration, right eye, with active choroidal neovascularization: Secondary | ICD-10-CM | POA: Diagnosis not present

## 2018-01-25 NOTE — Progress Notes (Signed)
Subjective:     Patient ID: Mariah Aguilar , female    DOB: Aug 17, 1922 , 85 y.o.   MRN: [id number]   Chief Complaint  Patient presents with  . Cough    HPI  She is brought in by her granddaughter for further evaluation of a cough.   Cough  This is a recurrent problem. The current episode started 1 to 4 weeks ago. The problem has been gradually worsening. The problem occurs every few hours. The cough is productive of sputum. Associated symptoms include wheezing. She has tried OTC cough suppressant for the symptoms. The treatment provided no relief.     Past Medical History:  Diagnosis Date  . Arthritis   . Cardiomyopathy   . Cecal neoplasm   . CHF (congestive heart failure) ([place])   . Colon polyps   . Diverticulosis   . Hyperlipidemia   . Hypertension   . Insomnia   . Internal hemorrhoids   . Left bundle branch block (LBBB) on electrocardiogram    EKG of 5'12 shows 1st degree AV block/LBBB -Epic.     Family History  Problem Relation Age of Onset  . Cancer Mother        COLON  . Stroke Father      Current Outpatient Medications:  .  amLODipine-valsartan (EXFORGE) 10-320 MG per tablet, , Disp: , Rfl:  .  carvedilol (COREG) 12.5 MG tablet, , Disp: , Rfl: 3 .  diazepam (VALIUM) 5 MG tablet, 2.5 mg. , Disp: , Rfl: 1 .  digoxin (LANOXIN) 0.125 MG tablet, Take 125 mcg by mouth daily., Disp: , Rfl: 3 .  esomeprazole (NEXIUM) 40 MG capsule, Take 40 mg by mouth daily., Disp: , Rfl:  .  furosemide (LASIX) 40 MG tablet, Take 40 mg by mouth 2 (two) times daily.  , Disp: , Rfl:  .  ibuprofen (ADVIL,MOTRIN) 600 MG tablet, Take 600 mg by mouth 3 (three) times daily as needed., Disp: , Rfl: 0 .  Iron Polysacch Cmplx-B12-FA (NIFEREX-150 FORTE PO), Take by mouth., Disp: , Rfl:  .  potassium chloride (KLOR-CON) 8 MEQ CR tablet, Take 8 mEq by mouth daily.  , Disp: , Rfl:  .  Probiotic Product (RESTORA PO), Take by mouth., Disp: , Rfl:  .  rosuvastatin (CRESTOR) 10 MG tablet, Take 10  mg by mouth daily.  , Disp: , Rfl:  .  traMADol (ULTRAM) 50 MG tablet, TAKE 1 TABLET TWICE DAILY AS NEEDED FOR PAIN, Disp: , Rfl: 0 .  celecoxib (CELEBREX) 100 MG capsule, TAKE 1 CAPSULE (100 MG TOTAL) BY MOUTH DAILY. (Patient not taking: Reported on 12/24/2017), Disp: 30 capsule, Rfl: 2   Allergies  Allergen Reactions  . Penicillins      Review of Systems  Constitutional: Negative.   Respiratory: Positive for cough and wheezing.   Cardiovascular: Negative.   Gastrointestinal: Negative.   Genitourinary: Negative.   Neurological: Negative.   Psychiatric/Behavioral: Negative.      Today's Vitals   01/20/18 1118 01/20/18 1202  BP: 132/74   Pulse: 79 69  Temp: (!) 97.4 F (36.3 C)   TempSrc: Oral   SpO2: 92% 94%  Weight: 130 lb (59 kg)   Height: 5\' 3"  (1.6 m)   PainSc: 8    PainLoc: Back    Body mass index is 23.03 kg/m.   Objective:  Physical Exam Vitals signs and nursing note reviewed.  Constitutional:      Appearance: Normal appearance. She is ill-appearing.  HENT:  Head: Normocephalic and atraumatic.  Cardiovascular:     Rate and Rhythm: Normal rate and regular rhythm.     Heart sounds: Normal heart sounds.  Pulmonary:     Effort: Pulmonary effort is normal.     Breath sounds: Wheezing and rhonchi present.  Skin:    General: Skin is warm.  Neurological:     Mental Status: She is alert.  Psychiatric:        Mood and Affect: Mood normal.         Assessment And Plan:     1. Acute bronchitis, unspecified organism  She was given albuterol nebulizer treatment with improvement in her breath sounds. She was also given kenalog, 40mg  IM x 1. I do not think abx are needed at this time.   - triamcinolone acetonide (KENALOG-40) injection 40 mg - albuterol (PROVENTIL) (2.5 MG/3ML) 0.083% nebulizer solution 2.5 mg        Maximino Greenland, MD

## 2018-02-09 DIAGNOSIS — E119 Type 2 diabetes mellitus without complications: Secondary | ICD-10-CM | POA: Diagnosis not present

## 2018-02-09 DIAGNOSIS — I10 Essential (primary) hypertension: Secondary | ICD-10-CM | POA: Diagnosis not present

## 2018-02-09 DIAGNOSIS — I5020 Unspecified systolic (congestive) heart failure: Secondary | ICD-10-CM | POA: Diagnosis not present

## 2018-02-09 DIAGNOSIS — M1990 Unspecified osteoarthritis, unspecified site: Secondary | ICD-10-CM | POA: Diagnosis not present

## 2018-02-09 DIAGNOSIS — I340 Nonrheumatic mitral (valve) insufficiency: Secondary | ICD-10-CM | POA: Diagnosis not present

## 2018-02-09 DIAGNOSIS — D649 Anemia, unspecified: Secondary | ICD-10-CM | POA: Diagnosis not present

## 2018-02-09 DIAGNOSIS — I38 Endocarditis, valve unspecified: Secondary | ICD-10-CM | POA: Diagnosis not present

## 2018-02-09 DIAGNOSIS — E785 Hyperlipidemia, unspecified: Secondary | ICD-10-CM | POA: Diagnosis not present

## 2018-02-11 DIAGNOSIS — H353113 Nonexudative age-related macular degeneration, right eye, advanced atrophic without subfoveal involvement: Secondary | ICD-10-CM | POA: Diagnosis not present

## 2018-02-11 DIAGNOSIS — H353211 Exudative age-related macular degeneration, right eye, with active choroidal neovascularization: Secondary | ICD-10-CM | POA: Diagnosis not present

## 2018-02-12 ENCOUNTER — Encounter: Payer: Self-pay | Admitting: Internal Medicine

## 2018-02-17 DIAGNOSIS — N8111 Cystocele, midline: Secondary | ICD-10-CM | POA: Diagnosis not present

## 2018-02-20 ENCOUNTER — Other Ambulatory Visit: Payer: Self-pay

## 2018-02-20 MED ORDER — DEXLANSOPRAZOLE 60 MG PO CPDR: 60.0000 mg | DELAYED_RELEASE_CAPSULE | Freq: Every day | ORAL | 1 refills | Status: DC

## 2018-02-25 ENCOUNTER — Encounter: Payer: Self-pay | Admitting: Internal Medicine

## 2018-02-25 ENCOUNTER — Ambulatory Visit (INDEPENDENT_AMBULATORY_CARE_PROVIDER_SITE_OTHER): Payer: Medicare Other | Admitting: Internal Medicine

## 2018-02-25 VITALS — BP 120/76 | HR 66 | Temp 98.2°F | Ht 63.0 in | Wt 131.0 lb

## 2018-02-25 DIAGNOSIS — R059 Cough, unspecified: Secondary | ICD-10-CM

## 2018-02-25 DIAGNOSIS — R05 Cough: Secondary | ICD-10-CM | POA: Diagnosis not present

## 2018-02-25 DIAGNOSIS — R1032 Left lower quadrant pain: Secondary | ICD-10-CM

## 2018-02-25 DIAGNOSIS — R350 Frequency of micturition: Secondary | ICD-10-CM | POA: Diagnosis not present

## 2018-02-25 DIAGNOSIS — Z8719 Personal history of other diseases of the digestive system: Secondary | ICD-10-CM | POA: Diagnosis not present

## 2018-02-25 DIAGNOSIS — I77811 Abdominal aortic ectasia: Secondary | ICD-10-CM | POA: Diagnosis not present

## 2018-02-25 DIAGNOSIS — I700 Atherosclerosis of aorta: Secondary | ICD-10-CM

## 2018-02-25 LAB — POCT URINALYSIS DIPSTICK
Bilirubin, UA: NEGATIVE
Glucose, UA: NEGATIVE
Ketones, UA: NEGATIVE
LEUKOCYTES UA: NEGATIVE
Nitrite, UA: NEGATIVE
Protein, UA: NEGATIVE
RBC UA: NEGATIVE
Spec Grav, UA: 1.015 (ref 1.010–1.025)
Urobilinogen, UA: 0.2 E.U./dL
pH, UA: 6.5 (ref 5.0–8.0)

## 2018-02-26 LAB — CBC WITH DIFFERENTIAL/PLATELET
Basophils Absolute: 0.1 10*3/uL (ref 0.0–0.2)
Basos: 1 %
EOS (ABSOLUTE): 0.4 10*3/uL (ref 0.0–0.4)
Eos: 4 %
Hematocrit: 35.8 % (ref 34.0–46.6)
Hemoglobin: 11.6 g/dL (ref 11.1–15.9)
Immature Grans (Abs): 0 10*3/uL (ref 0.0–0.1)
Immature Granulocytes: 0 %
Lymphocytes Absolute: 2 10*3/uL (ref 0.7–3.1)
Lymphs: 21 %
MCH: 26.7 pg (ref 26.6–33.0)
MCHC: 32.4 g/dL (ref 31.5–35.7)
MCV: 83 fL (ref 79–97)
Monocytes Absolute: 0.9 10*3/uL (ref 0.1–0.9)
Monocytes: 10 %
Neutrophils Absolute: 6.2 10*3/uL (ref 1.4–7.0)
Neutrophils: 64 %
PLATELETS: 656 10*3/uL — AB (ref 150–450)
RBC: 4.34 x10E6/uL (ref 3.77–5.28)
RDW: 15.4 % (ref 11.7–15.4)
WBC: 9.7 10*3/uL (ref 3.4–10.8)

## 2018-02-26 LAB — CMP14+EGFR
ALK PHOS: 58 IU/L (ref 39–117)
ALT: 14 IU/L (ref 0–32)
AST: 15 IU/L (ref 0–40)
Albumin/Globulin Ratio: 1.4 (ref 1.2–2.2)
Albumin: 4.5 g/dL (ref 3.5–4.6)
BUN/Creatinine Ratio: 16 (ref 12–28)
BUN: 16 mg/dL (ref 10–36)
Bilirubin Total: 0.3 mg/dL (ref 0.0–1.2)
CHLORIDE: 98 mmol/L (ref 96–106)
CO2: 24 mmol/L (ref 20–29)
CREATININE: 1 mg/dL (ref 0.57–1.00)
Calcium: 9.2 mg/dL (ref 8.7–10.3)
GFR calc Af Amer: 56 mL/min/{1.73_m2} — ABNORMAL LOW (ref >59)
GFR calc non Af Amer: 49 mL/min/{1.73_m2} — ABNORMAL LOW (ref >59)
Globulin, Total: 3.3 g/dL (ref 1.5–4.5)
Glucose: 116 mg/dL — ABNORMAL HIGH (ref 65–99)
POTASSIUM: 4.7 mmol/L (ref 3.5–5.2)
Sodium: 141 mmol/L (ref 134–144)
Total Protein: 7.8 g/dL (ref 6.0–8.5)

## 2018-03-01 NOTE — Progress Notes (Signed)
Subjective:     Patient ID: Mariah Aguilar , female    DOB: September 23, 1922 , 85 y.o.   MRN: [id number]   Chief Complaint  Patient presents with  . pneumonia f/u    HPI  She is here today for f/u pneumonia. She feels well, still has a cough. She denies fever/chills. The cough does not keep her from sleeping at night. It is non-productive.     Past Medical History:  Diagnosis Date  . Arthritis   . Cardiomyopathy   . Cecal neoplasm   . CHF (congestive heart failure) ([place])   . Colon polyps   . Diverticulosis   . Hyperlipidemia   . Hypertension   . Insomnia   . Internal hemorrhoids   . Left bundle branch block (LBBB) on electrocardiogram    EKG of 5'12 shows 1st degree AV block/LBBB -Epic.     Family History  Problem Relation Age of Onset  . Cancer Mother        COLON  . Stroke Father      Current Outpatient Medications:  .  amLODipine-valsartan (EXFORGE) 10-320 MG per tablet, , Disp: , Rfl:  .  carvedilol (COREG) 12.5 MG tablet, , Disp: , Rfl: 3 .  celecoxib (CELEBREX) 100 MG capsule, TAKE 1 CAPSULE (100 MG TOTAL) BY MOUTH DAILY., Disp: 30 capsule, Rfl: 2 .  dexlansoprazole (DEXILANT) 60 MG capsule, Take 1 capsule (60 mg total) by mouth daily., Disp: 30 capsule, Rfl: 1 .  diazepam (VALIUM) 5 MG tablet, 2.5 mg. , Disp: , Rfl: 1 .  digoxin (LANOXIN) 0.125 MG tablet, Take 125 mcg by mouth daily., Disp: , Rfl: 3 .  esomeprazole (NEXIUM) 40 MG capsule, Take 40 mg by mouth daily., Disp: , Rfl:  .  furosemide (LASIX) 40 MG tablet, Take 40 mg by mouth 2 (two) times daily.  , Disp: , Rfl:  .  ibuprofen (ADVIL,MOTRIN) 600 MG tablet, Take 600 mg by mouth 3 (three) times daily as needed., Disp: , Rfl: 0 .  Iron Polysacch Cmplx-B12-FA (NIFEREX-150 FORTE PO), Take by mouth., Disp: , Rfl:  .  potassium chloride (KLOR-CON) 8 MEQ CR tablet, Take 8 mEq by mouth daily.  , Disp: , Rfl:  .  Probiotic Product (RESTORA PO), Take by mouth., Disp: , Rfl:  .  rosuvastatin (CRESTOR) 10 MG  tablet, Take 10 mg by mouth daily.  , Disp: , Rfl:  .  traMADol (ULTRAM) 50 MG tablet, TAKE 1 TABLET TWICE DAILY AS NEEDED FOR PAIN, Disp: , Rfl: 0   Allergies  Allergen Reactions  . Penicillins      Review of Systems  Constitutional: Negative.   Respiratory: Positive for cough.   Cardiovascular: Negative.   Gastrointestinal: Positive for abdominal pain.  Neurological: Negative.   Psychiatric/Behavioral: Negative.      Today's Vitals   02/25/18 1108  BP: 120/76  Pulse: 66  Temp: 98.2 F (36.8 C)  TempSrc: Oral  Weight: 131 lb (59.4 kg)  Height: 5' 3"  (1.6 m)  PainSc: 8   PainLoc: Abdomen   Body mass index is 23.21 kg/m.   Objective:  Physical Exam Vitals signs and nursing note reviewed.  Constitutional:      Appearance: Normal appearance.  HENT:     Head: Normocephalic and atraumatic.  Cardiovascular:     Rate and Rhythm: Normal rate and regular rhythm.     Heart sounds: Normal heart sounds.  Pulmonary:     Effort: Pulmonary effort is normal.  Breath sounds: Normal breath sounds.  Abdominal:     General: Bowel sounds are normal.     Tenderness: There is abdominal tenderness.     Comments: Rounded, LLQ tenderness to palpation  Skin:    General: Skin is warm.  Neurological:     General: No focal deficit present.     Mental Status: She is alert.  Psychiatric:        Mood and Affect: Mood normal.        Behavior: Behavior normal.         Assessment And Plan:     1. Cough  Pt advised that she may have a cough for several weeks after diagnosis of PNA. She does admit that it is lessening in frequency and severity.   2. LLQ pain  I will check labs as listed below. She does have h/o diverticulosis.  However, I am not sure that this is a diverticulitis flare. I will make further recommendations once her labs are available for review.   - CMP14+EGFR - CBC with Diff  3. Aortic atherosclerosis ([facility])  Chronic. She declines to take statin therapy. She  prefers to manage with diet/exercise only.   4. Aortic ectasia, abdominal ([facility])  Chronic.   5. Urinary frequency  I will check urinalysis today.   - POCT Urinalysis Dipstick (15615)        Maximino Greenland, MD

## 2018-03-05 ENCOUNTER — Encounter (HOSPITAL_COMMUNITY): Payer: Self-pay

## 2018-03-05 ENCOUNTER — Observation Stay (HOSPITAL_COMMUNITY)
Admission: EM | Admit: 2018-03-05 | Discharge: 2018-03-06 | Disposition: A | Discharge status: Home / Self Care | Payer: Medicare Other | Attending: Internal Medicine | Admitting: Internal Medicine

## 2018-03-05 ENCOUNTER — Telehealth: Payer: Self-pay

## 2018-03-05 ENCOUNTER — Other Ambulatory Visit: Payer: Self-pay

## 2018-03-05 ENCOUNTER — Emergency Department (HOSPITAL_COMMUNITY): Payer: Medicare Other

## 2018-03-05 DIAGNOSIS — Z79899 Other long term (current) drug therapy: Secondary | ICD-10-CM | POA: Diagnosis not present

## 2018-03-05 DIAGNOSIS — K648 Other hemorrhoids: Secondary | ICD-10-CM | POA: Insufficient documentation

## 2018-03-05 DIAGNOSIS — I130 Hypertensive heart and chronic kidney disease with heart failure and stage 1 through stage 4 chronic kidney disease, or unspecified chronic kidney disease: Secondary | ICD-10-CM | POA: Diagnosis not present

## 2018-03-05 DIAGNOSIS — R58 Hemorrhage, not elsewhere classified: Secondary | ICD-10-CM | POA: Diagnosis not present

## 2018-03-05 DIAGNOSIS — N182 Chronic kidney disease, stage 2 (mild): Secondary | ICD-10-CM | POA: Insufficient documentation

## 2018-03-05 DIAGNOSIS — Z791 Long term (current) use of non-steroidal anti-inflammatories (NSAID): Secondary | ICD-10-CM | POA: Insufficient documentation

## 2018-03-05 DIAGNOSIS — I10 Essential (primary) hypertension: Secondary | ICD-10-CM

## 2018-03-05 DIAGNOSIS — I447 Left bundle-branch block, unspecified: Secondary | ICD-10-CM | POA: Insufficient documentation

## 2018-03-05 DIAGNOSIS — Z86010 Personal history of colonic polyps: Secondary | ICD-10-CM | POA: Insufficient documentation

## 2018-03-05 DIAGNOSIS — I429 Cardiomyopathy, unspecified: Secondary | ICD-10-CM | POA: Insufficient documentation

## 2018-03-05 DIAGNOSIS — E1122 Type 2 diabetes mellitus with diabetic chronic kidney disease: Secondary | ICD-10-CM | POA: Diagnosis not present

## 2018-03-05 DIAGNOSIS — I700 Atherosclerosis of aorta: Secondary | ICD-10-CM | POA: Diagnosis not present

## 2018-03-05 DIAGNOSIS — D473 Essential (hemorrhagic) thrombocythemia: Secondary | ICD-10-CM | POA: Diagnosis not present

## 2018-03-05 DIAGNOSIS — K5730 Diverticulosis of large intestine without perforation or abscess without bleeding: Secondary | ICD-10-CM | POA: Diagnosis not present

## 2018-03-05 DIAGNOSIS — K922 Gastrointestinal hemorrhage, unspecified: Principal | ICD-10-CM | POA: Insufficient documentation

## 2018-03-05 DIAGNOSIS — R1084 Generalized abdominal pain: Secondary | ICD-10-CM | POA: Diagnosis not present

## 2018-03-05 DIAGNOSIS — K625 Hemorrhage of anus and rectum: Secondary | ICD-10-CM

## 2018-03-05 DIAGNOSIS — E785 Hyperlipidemia, unspecified: Secondary | ICD-10-CM | POA: Insufficient documentation

## 2018-03-05 DIAGNOSIS — G4700 Insomnia, unspecified: Secondary | ICD-10-CM | POA: Insufficient documentation

## 2018-03-05 DIAGNOSIS — Z7982 Long term (current) use of aspirin: Secondary | ICD-10-CM | POA: Insufficient documentation

## 2018-03-05 DIAGNOSIS — I5032 Chronic diastolic (congestive) heart failure: Secondary | ICD-10-CM | POA: Insufficient documentation

## 2018-03-05 DIAGNOSIS — R52 Pain, unspecified: Secondary | ICD-10-CM | POA: Diagnosis not present

## 2018-03-05 LAB — LIPASE, BLOOD: Lipase: 22 U/L (ref 11–51)

## 2018-03-05 LAB — CBC
HEMATOCRIT: 36 % (ref 36.0–46.0)
HEMATOCRIT: 33.2 % — AB (ref 36.0–46.0)
HEMOGLOBIN: 10.9 g/dL — AB (ref 12.0–15.0)
Hemoglobin: 10.3 g/dL — ABNORMAL LOW (ref 12.0–15.0)
MCH: 25.4 pg — ABNORMAL LOW (ref 26.0–34.0)
MCH: 25.8 pg — ABNORMAL LOW (ref 26.0–34.0)
MCHC: 30.3 g/dL (ref 30.0–36.0)
MCHC: 31 g/dL (ref 30.0–36.0)
MCV: 83.9 fL (ref 80.0–100.0)
MCV: 83 fL (ref 80.0–100.0)
Platelets: 742 10*3/uL — ABNORMAL HIGH (ref 150–400)
Platelets: 695 10*3/uL — ABNORMAL HIGH (ref 150–400)
RBC: 4.29 MIL/uL (ref 3.87–5.11)
RBC: 4 MIL/uL (ref 3.87–5.11)
RDW: 17.8 % — ABNORMAL HIGH (ref 11.5–15.5)
RDW: 17.7 % — ABNORMAL HIGH (ref 11.5–15.5)
WBC: 9.8 10*3/uL (ref 4.0–10.5)
WBC: 8.2 10*3/uL (ref 4.0–10.5)
nRBC: 0 % (ref 0.0–0.2)
nRBC: 0 % (ref 0.0–0.2)

## 2018-03-05 LAB — TYPE AND SCREEN
ABO/RH(D): O POS
Antibody Screen: NEGATIVE

## 2018-03-05 LAB — COMPREHENSIVE METABOLIC PANEL
ALT: 16 U/L (ref 0–44)
ANION GAP: 8 (ref 5–15)
AST: 18 U/L (ref 15–41)
Albumin: 3.7 g/dL (ref 3.5–5.0)
Alkaline Phosphatase: 43 U/L (ref 38–126)
BUN: 10 mg/dL (ref 8–23)
CHLORIDE: 106 mmol/L (ref 98–111)
CO2: 26 mmol/L (ref 22–32)
Calcium: 8.8 mg/dL — ABNORMAL LOW (ref 8.9–10.3)
Creatinine, Ser: 0.75 mg/dL (ref 0.44–1.00)
GFR calc Af Amer: >60 mL/min (ref >60)
GFR calc non Af Amer: >60 mL/min (ref >60)
Glucose, Bld: 135 mg/dL — ABNORMAL HIGH (ref 70–99)
Potassium: 3.6 mmol/L (ref 3.5–5.1)
Sodium: 140 mmol/L (ref 135–145)
Total Bilirubin: 0.3 mg/dL (ref 0.3–1.2)
Total Protein: 7.7 g/dL (ref 6.5–8.1)

## 2018-03-05 LAB — GLUCOSE, CAPILLARY: Glucose-Capillary: 109 mg/dL — ABNORMAL HIGH (ref 70–99)

## 2018-03-05 MED ORDER — IRBESARTAN 300 MG PO TABS
300.0000 mg | ORAL_TABLET | Freq: Every day | ORAL | Status: DC
  Administered 2018-03-06: 300 mg via ORAL
  Filled 2018-03-05: qty 1

## 2018-03-05 MED ORDER — OXYCODONE-ACETAMINOPHEN 5-325 MG PO TABS
1.0000 | ORAL_TABLET | Freq: Three times a day (TID) | ORAL | Status: DC | PRN
  Administered 2018-03-05: 1 via ORAL
  Filled 2018-03-05: qty 1

## 2018-03-05 MED ORDER — TRAMADOL HCL 50 MG PO TABS: 50.0000 mg | ORAL_TABLET | Freq: Three times a day (TID) | ORAL | Status: DC | PRN

## 2018-03-05 MED ORDER — DIAZEPAM 5 MG PO TABS
5.0000 mg | ORAL_TABLET | Freq: Every evening | ORAL | Status: DC | PRN
  Administered 2018-03-05: 5 mg via ORAL
  Filled 2018-03-05: qty 1

## 2018-03-05 MED ORDER — ROSUVASTATIN CALCIUM 5 MG PO TABS
10.0000 mg | ORAL_TABLET | Freq: Every day | ORAL | Status: DC
  Administered 2018-03-06: 10 mg via ORAL
  Filled 2018-03-05: qty 2

## 2018-03-05 MED ORDER — RESTORA PO CAPS: ORAL_CAPSULE | Freq: Every day | ORAL | Status: DC

## 2018-03-05 MED ORDER — RISAQUAD PO CAPS
1.0000 | ORAL_CAPSULE | Freq: Every day | ORAL | Status: DC
  Administered 2018-03-06: 1 via ORAL
  Filled 2018-03-05: qty 1

## 2018-03-05 MED ORDER — ONDANSETRON HCL 4 MG PO TABS: 4.0000 mg | ORAL_TABLET | Freq: Four times a day (QID) | ORAL | Status: DC | PRN

## 2018-03-05 MED ORDER — DIGOXIN 125 MCG PO TABS
0.1250 mg | ORAL_TABLET | Freq: Every day | ORAL | Status: DC
  Administered 2018-03-06: 0.125 mg via ORAL
  Filled 2018-03-05: qty 1

## 2018-03-05 MED ORDER — CARVEDILOL 12.5 MG PO TABS
12.5000 mg | ORAL_TABLET | Freq: Two times a day (BID) | ORAL | Status: DC
  Administered 2018-03-06: 12.5 mg via ORAL
  Filled 2018-03-05: qty 1

## 2018-03-05 MED ORDER — ONDANSETRON HCL 4 MG/2ML IJ SOLN: 4.0000 mg | Freq: Four times a day (QID) | INTRAMUSCULAR | Status: DC | PRN

## 2018-03-05 MED ORDER — INSULIN ASPART 100 UNIT/ML ~~LOC~~ SOLN: 0.0000 [IU] | Freq: Three times a day (TID) | SUBCUTANEOUS | Status: DC

## 2018-03-05 MED ORDER — AMLODIPINE BESYLATE-VALSARTAN 10-320 MG PO TABS: 1.0000 | ORAL_TABLET | Freq: Every day | ORAL | Status: DC

## 2018-03-05 MED ORDER — INSULIN ASPART 100 UNIT/ML ~~LOC~~ SOLN: 0.0000 [IU] | Freq: Every day | SUBCUTANEOUS | Status: DC

## 2018-03-05 MED ORDER — FUROSEMIDE 40 MG PO TABS
40.0000 mg | ORAL_TABLET | Freq: Every day | ORAL | Status: DC
  Administered 2018-03-06: 40 mg via ORAL
  Filled 2018-03-05: qty 1

## 2018-03-05 MED ORDER — SODIUM CHLORIDE 0.9 % IV SOLN
80.0000 mg | Freq: Once | INTRAVENOUS | Status: AC
  Administered 2018-03-05: 80 mg via INTRAVENOUS
  Filled 2018-03-05: qty 80

## 2018-03-05 MED ORDER — POTASSIUM CHLORIDE CRYS ER 10 MEQ PO TBCR
10.0000 meq | EXTENDED_RELEASE_TABLET | Freq: Every day | ORAL | Status: DC
  Administered 2018-03-06: 10 meq via ORAL
  Filled 2018-03-05: qty 1

## 2018-03-05 MED ORDER — SODIUM CHLORIDE 0.9 % IV SOLN
8.0000 mg/h | INTRAVENOUS | Status: DC
  Administered 2018-03-05 – 2018-03-06 (×2): 8 mg/h via INTRAVENOUS
  Filled 2018-03-05 (×4): qty 80

## 2018-03-05 MED ORDER — PANTOPRAZOLE SODIUM 40 MG IV SOLR: 40.0000 mg | Freq: Two times a day (BID) | INTRAVENOUS | Status: DC

## 2018-03-05 MED ORDER — AMLODIPINE BESYLATE 10 MG PO TABS
10.0000 mg | ORAL_TABLET | Freq: Every day | ORAL | Status: DC
  Administered 2018-03-06: 10 mg via ORAL
  Filled 2018-03-05: qty 1

## 2018-03-05 MED ORDER — IOHEXOL 300 MG/ML  SOLN
100.0000 mL | Freq: Once | INTRAMUSCULAR | Status: AC | PRN
  Administered 2018-03-05: 100 mL via INTRAVENOUS

## 2018-03-05 MED ORDER — SODIUM CHLORIDE 0.9 % IV SOLN
INTRAVENOUS | Status: DC
  Administered 2018-03-05: 22:00:00 via INTRAVENOUS

## 2018-03-05 NOTE — ED Triage Notes (Signed)
Per [facility], pt from home with complaint of bright  Red blood in stools that began last night with left sided abd pain. Not on thinners. Denies n/v. VSS

## 2018-03-05 NOTE — ED Notes (Signed)
ED TO INPATIENT HANDOFF REPORT  ED Nurse Name and Phone #: Cherylann Ratel [phone]  S Name/Age/Gender Mariah Aguilar 85 y.o. female Room/Bed: 031C/031C  Code Status   Code Status: Not on file  Home/SNF/Other Home Patient oriented to: self, place, time and situation Is this baseline? Yes   Triage Complete: Triage complete  Chief Complaint poss gi bleed  Triage Note Per [facility], pt from home with complaint of bright  Red blood in stools that began last night with left sided abd pain. Not on thinners. Denies n/v. VSS   Allergies Allergies  Allergen Reactions  . Shrimp [Shellfish Allergy] Itching and Swelling  . Penicillins Hives    Did it involve swelling of the face/tongue/throat, SOB, or low BP? Unknown Did it involve sudden or severe rash/hives, skin peeling, or any reaction on the inside of your mouth or nose? Unknown Did you need to seek medical attention at a hospital or doctor's office? Unknown When did it last happen? If all above answers are "NO", may proceed with cephalosporin use.    Level of Care/Admitting Diagnosis ED Disposition    ED Disposition Condition [facility] Area: [place] [[id number]]  Level of Care: Medical Telemetry [104]  I expect the patient will be discharged within 24 hours: Yes  LOW acuity---Tx typically complete <24 hrs---ACUTE conditions typically can be evaluated <24 hours---LABS likely to return to acceptable levels <24 hours---IS near functional baseline---EXPECTED to return to current living arrangement---NOT newly hypoxic: Meets criteria for 5C-Observation unit  Diagnosis: Rectal bleed [397673]  Admitting Physician: Elwyn Reach [[id number]]  Attending Physician: Elwyn Reach [[id number]]  PT Class (Do Not Modify): Observation [104]  PT Acc Code (Do Not Modify): Observation [10022]       B Medical/Surgery History Past Medical History:  Diagnosis Date  . Arthritis   . Cardiomyopathy   . Cecal  neoplasm   . CHF (congestive heart failure) ([place])   . Colon polyps   . Diverticulosis   . Hyperlipidemia   . Hypertension   . Insomnia   . Internal hemorrhoids   . Left bundle branch block (LBBB) on electrocardiogram    EKG of 5'12 shows 1st degree AV block/LBBB -Epic.   Past Surgical History:  Procedure Laterality Date  . ABDOMINAL HYSTERECTOMY    . COLON SURGERY    . COLONOSCOPY  05/27/2011   Procedure: COLONOSCOPY;  Surgeon: Juanita Craver, MD;  Location: [facility];  Service: Endoscopy;  Laterality: N/A;  . COLONOSCOPY WITH PROPOFOL N/A 06/30/2014   Procedure: COLONOSCOPY WITH PROPOFOL;  Surgeon: Juanita Craver, MD;  Location: [facility];  Service: Endoscopy;  Laterality: N/A;     A IV Location/Drains/Wounds Patient Lines/Drains/Airways Status   Active Line/Drains/Airways    Name:   Placement date:   Placement time:   Site:   Days:   Peripheral IV 03/05/18 Right Antecubital   03/05/18    1526    Antecubital   less than 1          Intake/Output Last 24 hours No intake or output data in the 24 hours ending 03/05/18 1924  Labs/Imaging Results for orders placed or performed during the hospital encounter of 03/05/18 (from the past 48 hour(s))  Comprehensive metabolic panel     Status: Abnormal   Collection Time: 03/05/18  3:24 PM  Result Value Ref Range   Sodium 140 135 - 145 mmol/L   Potassium 3.6 3.5 - 5.1 mmol/L   Chloride 106 98 -  111 mmol/L   CO2 26 22 - 32 mmol/L   Glucose, Bld 135 (H) 70 - 99 mg/dL   BUN 10 8 - 23 mg/dL   Creatinine, Ser 0.75 0.44 - 1.00 mg/dL   Calcium 8.8 (L) 8.9 - 10.3 mg/dL   Total Protein 7.7 6.5 - 8.1 g/dL   Albumin 3.7 3.5 - 5.0 g/dL   AST 18 15 - 41 U/L   ALT 16 0 - 44 U/L   Alkaline Phosphatase 43 38 - 126 U/L   Total Bilirubin 0.3 0.3 - 1.2 mg/dL   GFR calc non Af Amer >60 >60 mL/min   GFR calc Af Amer >60 >60 mL/min   Anion gap 8 5 - 15    Comment: Performed at [facility] [address] [place], [place] [address]  CBC      Status: Abnormal   Collection Time: 03/05/18  3:24 PM  Result Value Ref Range   WBC 9.8 4.0 - 10.5 K/uL   RBC 4.29 3.87 - 5.11 MIL/uL   Hemoglobin 10.9 (L) 12.0 - 15.0 g/dL   HCT 36.0 36.0 - 46.0 %   MCV 83.9 80.0 - 100.0 fL   MCH 25.4 (L) 26.0 - 34.0 pg   MCHC 30.3 30.0 - 36.0 g/dL   RDW 17.8 (H) 11.5 - 15.5 %   Platelets 742 (H) 150 - 400 K/uL   nRBC 0.0 0.0 - 0.2 %    Comment: Performed at [facility] [address], [place], [place] [address]  Type and screen [place]     Status: None   Collection Time: 03/05/18  3:24 PM  Result Value Ref Range   ABO/RH(D) O POS    Antibody Screen NEG    Sample Expiration      03/08/2018 Performed at [facility], [place] [address], [place], [place] [address]   Lipase, blood     Status: None   Collection Time: 03/05/18  3:24 PM  Result Value Ref Range   Lipase 22 11 - 51 U/L    Comment: Performed at [facility] [address], [place], [place] [address]   Ct Abdomen Pelvis W Contrast  Result Date: 03/05/2018 CLINICAL DATA:  Abdominal pain EXAM: CT ABDOMEN AND PELVIS WITH CONTRAST TECHNIQUE: Multidetector CT imaging of the abdomen and pelvis was performed using the standard protocol following bolus administration of intravenous contrast. CONTRAST:  177mL OMNIPAQUE IOHEXOL 300 MG/ML  SOLN COMPARISON:  03/16/2015 FINDINGS: Lower chest: Cardiomegaly.  No acute abnormality Hepatobiliary: No focal hepatic abnormality. Gallbladder unremarkable. Pancreas: No focal abnormality or ductal dilatation. Spleen: No focal abnormality.  Normal size. Adrenals/Urinary Tract: No renal or ureteral stones. No hydronephrosis. Adrenal glands and urinary bladder unremarkable. Stomach/Bowel: Diffuse colonic diverticulosis. No active diverticulitis. Stomach and small bowel unremarkable. No evidence of obstruction. Vascular/Lymphatic: Aortic atherosclerosis. No enlarged abdominal or pelvic lymph nodes. Reproductive: Prior hysterectomy.  No adnexal masses. Pessary noted in place. Other: No free fluid or free air. Musculoskeletal: Advanced diffuse degenerative disc disease and facet disease. Lumbar scoliosis. IMPRESSION: Diffuse colonic diverticulosis.  No active diverticulitis. Aortic atherosclerosis.  Cardiomegaly. No acute findings in the abdomen or pelvis. Electronically Signed   By: Rolm Baptise M.D.   On: 03/05/2018 18:08    Pending Labs Unresulted Labs (From admission, onward)   None      Vitals/Pain Today's Vitals   03/05/18 1730 03/05/18 1745 03/05/18 1800 03/05/18 1855  BP: (!) 146/65 (!) 144/61  139/77   Pulse: 75 69 69   Resp: 16 20 (!) 22   Temp:      TempSrc:      SpO2: 95% 98% 96%   PainSc:    3     Isolation Precautions No active isolations  Medications Medications  iohexol (OMNIPAQUE) 300 MG/ML solution 100 mL (100 mLs Intravenous Contrast Given 03/05/18 1723)    Mobility walks Low fall risk   Focused Assessments Pulmonary Assessment Handoff:  Last BM was today 1300 03-05-2018 and was bright red.   R Recommendations: See Admitting Provider Note  Report given to:

## 2018-03-05 NOTE — H&P (Signed)
History and Physical   Mariah Aguilar ZOX:[id number] DOB: November 26, 1922 DOA: 03/05/2018  Referring MD/NP/PA: Dr. Francia Greaves  PCP: Glendale Chard, MD   Outpatient Specialists: Dr. Juanita Craver, gastroenterology  Patient coming from: Home  Chief Complaint: Rectal bleed  HPI: Mariah Aguilar is a 85 y.o. 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  Patient had 2 bowel movements that were bright red blood per rectum.  She initially saw it while she wiped herself.  Denied any nausea vomiting or hematemesis.  Denied any injury.  Bleeding again is painless.  Patient has not had any more bleed since arrival in the ER.  No fever or chills.  She has had vague lower abdominal pain for the last 4 to 5 days.  Patient says she has taken some food she should not take it.  She has been avoiding seeds nuts broccoli etc. due to known history of diverticulosis.  She however took something in the last couple of days that she believes may have caused this.  She is also taking aspirin as well as ibuprofen.  She occasionally takes Tylenol but mostly these 2.  So far she is hemodynamically stable and hemoglobin is staying stable at around 11 g.  She is however being admitted for observation for what is believed to be a diverticular bleed..  ED Course: Vitals are stable with blood pressure 146/65.  Her white count is 9.8 hemoglobin 10.9 down from 11.5 last month.  Platelets of 742.  Electrolytes appear to be normal except for calcium of 8.8.  CT abdomen pelvis showed no acute findings.  Only diffuse diverticulosis.  Patient is therefore being admitted with acute lower GI bleed most likely diverticular bleed.  Review of Systems: As per HPI otherwise 10 point review of systems negative.    Past Medical History:    Diagnosis Date  . Arthritis   . Cardiomyopathy   . Cecal neoplasm   . CHF (congestive heart failure) ([place])   . Colon polyps   . Diverticulosis   . Hyperlipidemia   . Hypertension   . Insomnia   . Internal hemorrhoids   . Left bundle branch block (LBBB) on electrocardiogram    EKG of 5'12 shows 1st degree AV block/LBBB -Epic.    Past Surgical History:  Procedure Laterality Date  . ABDOMINAL HYSTERECTOMY    . COLON SURGERY    . COLONOSCOPY  05/27/2011   Procedure: COLONOSCOPY;  Surgeon: Juanita Craver, MD;  Location: [facility];  Service: Endoscopy;  Laterality: N/A;  . COLONOSCOPY WITH PROPOFOL N/A 06/30/2014   Procedure: COLONOSCOPY WITH PROPOFOL;  Surgeon: Juanita Craver, MD;  Location: [facility];  Service: Endoscopy;  Laterality: N/A;     reports that she has never smoked. She has never used smokeless tobacco. She reports that she does not drink alcohol or use drugs.  Allergies  Allergen Reactions  . Shrimp [Shellfish Allergy] Itching and Swelling  . Penicillins Hives    Did it involve swelling of the face/tongue/throat, SOB, or low BP? Unknown Did it involve sudden or severe rash/hives, skin peeling, or any reaction on the inside of your mouth or nose? Unknown Did you need to seek medical attention at a hospital or doctor's office? Unknown When did it last happen? If all above answers are "NO", may proceed with cephalosporin use.    Family History  Problem  Relation Age of Onset  . Cancer Mother        COLON  . Stroke Father      Prior to Admission medications   Medication Sig Start Date End Date Taking? Authorizing Provider  amLODipine-valsartan (EXFORGE) 10-320 MG per tablet  12/20/13  Yes [provider]  aspirin EC 81 MG tablet Take 81 mg by mouth daily.   Yes [provider]  carvedilol (COREG) 12.5 MG tablet Take 12.5 mg by mouth 2 (two) times daily with a meal.  11/10/13  Yes [provider]  dexlansoprazole (DEXILANT) 60 MG  capsule Take 1 capsule (60 mg total) by mouth daily. 02/20/18  Yes Glendale Chard, MD  diazepam (VALIUM) 5 MG tablet Take 5 mg by mouth at bedtime as needed for sedation.  12/11/13  Yes [provider]  digoxin (LANOXIN) 0.125 MG tablet Take 0.125 mg by mouth daily.  10/07/13  Yes [provider]  furosemide (LASIX) 40 MG tablet Take 40 mg by mouth daily.    Yes [provider]  ibuprofen (ADVIL,MOTRIN) 600 MG tablet Take 600 mg by mouth 3 (three) times daily as needed. 02/08/15  Yes [provider]  Iron Polysacch Cmplx-B12-FA (NIFEREX-150 FORTE PO) Take 150 mg by mouth daily.    Yes [provider]  potassium chloride (KLOR-CON) 8 MEQ CR tablet Take 8 mEq by mouth daily.     Yes [provider]  Probiotic Product (RESTORA PO) Take 1 tablet by mouth daily.    Yes [provider]  rosuvastatin (CRESTOR) 10 MG tablet Take 10 mg by mouth daily.     Yes [provider]  traMADol (ULTRAM) 50 MG tablet Take 50 mg by mouth as needed for moderate pain.  10/13/17  Yes [provider]  celecoxib (CELEBREX) 100 MG capsule TAKE 1 CAPSULE (100 MG TOTAL) BY MOUTH DAILY. Patient not taking: Reported on 03/05/2018 03/29/13   Charlett Blake, MD    Physical Exam: Vitals:   03/05/18 1800 03/05/18 1815 03/05/18 1915 03/05/18 2023  BP: 139/77 134/67  134/72  Pulse: 69 70 68 74  Resp: (!) 22 (!) 22 15 18   Temp:    98 F (36.7 C)  TempSrc:    Oral  SpO2: 96% 97% 96% 95%  Weight:    56.6 kg  Height:    5\' 5"  (1.651 m)      Constitutional: NAD, calm, comfortable, very much with it and pleasant Vitals:   03/05/18 1800 03/05/18 1815 03/05/18 1915 03/05/18 2023  BP: 139/77 134/67  134/72  Pulse: 69 70 68 74  Resp: (!) 22 (!) 22 15 18   Temp:    98 F (36.7 C)  TempSrc:    Oral  SpO2: 96% 97% 96% 95%  Weight:    56.6 kg  Height:    5\' 5"  (1.651 m)   Eyes: PERRL, lids and conjunctivae normal ENMT: Mucous membranes are moist.  Posterior pharynx clear of any exudate or lesions.Normal dentition.  Neck: normal, supple, no masses, no thyromegaly Respiratory: clear to auscultation bilaterally, no wheezing, no crackles. Normal respiratory effort. No accessory muscle use.  Cardiovascular: Regular rate and rhythm, no murmurs / rubs / gallops. No extremity edema. 2+ pedal pulses. No carotid bruits.  Abdomen: Mild epigastric to left lower quadrant tenderness, no masses palpated. No hepatosplenomegaly. Bowel sounds positive.  Musculoskeletal: no clubbing / cyanosis. No joint deformity upper and lower extremities. Good ROM, no contractures. Normal muscle tone.  Skin: no rashes,  lesions, ulcers. No induration Neurologic: CN 2-12 grossly intact. Sensation intact, DTR normal. Strength 5/5 in all 4.  Psychiatric: Normal judgment and insight. Alert and oriented x 3. Normal mood.     Labs on Admission: I have personally reviewed following labs and imaging studies  CBC: Recent Labs  Lab 03/05/18 1524  WBC 9.8  HGB 10.9*  HCT 36.0  MCV 83.9  PLT 938*   Basic Metabolic Panel: Recent Labs  Lab 03/05/18 1524  NA 140  K 3.6  CL 106  CO2 26  GLUCOSE 135*  BUN 10  CREATININE 0.75  CALCIUM 8.8*   GFR: Estimated Creatinine Clearance: 39.3 mL/min (by C-G formula based on SCr of 0.75 mg/dL). Liver Function Tests: Recent Labs  Lab 03/05/18 1524  AST 18  ALT 16  ALKPHOS 43  BILITOT 0.3  PROT 7.7  ALBUMIN 3.7   Recent Labs  Lab 03/05/18 1524  LIPASE 22   No results for input(s): AMMONIA in the last 168 hours. Coagulation Profile: No results for input(s): INR, PROTIME in the last 168 hours. Cardiac Enzymes: No results for input(s): CKTOTAL, CKMB, CKMBINDEX, TROPONINI in the last 168 hours. BNP (last 3 results) No results for input(s): PROBNP in the last 8760 hours. HbA1C: No results for input(s): HGBA1C in the last 72 hours. CBG: No results for input(s): GLUCAP in the last 168 hours. Lipid Profile: No  results for input(s): CHOL, HDL, LDLCALC, TRIG, CHOLHDL, LDLDIRECT in the last 72 hours. Thyroid Function Tests: No results for input(s): TSH, T4TOTAL, FREET4, T3FREE, THYROIDAB in the last 72 hours. Anemia Panel: No results for input(s): VITAMINB12, FOLATE, FERRITIN, TIBC, IRON, RETICCTPCT in the last 72 hours. Urine analysis:    Component Value Date/Time   BILIRUBINUR negative 02/25/2018 1708   PROTEINUR Negative 02/25/2018 1708   UROBILINOGEN 0.2 02/25/2018 1708   NITRITE negative 02/25/2018 1708   LEUKOCYTESUR Negative 02/25/2018 1708   Sepsis Labs: @LABRCNTIP (procalcitonin:4,lacticidven:4) )No results found for this or any previous visit (from the past 240 hour(s)).   Radiological Exams on Admission: Ct Abdomen Pelvis W Contrast  Result Date: 03/05/2018 CLINICAL DATA:  Abdominal pain EXAM: CT ABDOMEN AND PELVIS WITH CONTRAST TECHNIQUE: Multidetector CT imaging of the abdomen and pelvis was performed using the standard protocol following bolus administration of intravenous contrast. CONTRAST:  155mL OMNIPAQUE IOHEXOL 300 MG/ML  SOLN COMPARISON:  03/16/2015 FINDINGS: Lower chest: Cardiomegaly.  No acute abnormality Hepatobiliary: No focal hepatic abnormality. Gallbladder unremarkable. Pancreas: No focal abnormality or ductal dilatation. Spleen: No focal abnormality.  Normal size. Adrenals/Urinary Tract: No renal or ureteral stones. No hydronephrosis. Adrenal glands and urinary bladder unremarkable. Stomach/Bowel: Diffuse colonic diverticulosis. No active diverticulitis. Stomach and small bowel unremarkable. No evidence of obstruction. Vascular/Lymphatic: Aortic atherosclerosis. No enlarged abdominal or pelvic lymph nodes. Reproductive: Prior hysterectomy. No adnexal masses. Pessary noted in place. Other: No free fluid or free air. Musculoskeletal: Advanced diffuse degenerative disc disease and facet disease. Lumbar scoliosis. IMPRESSION: Diffuse colonic diverticulosis.  No active  diverticulitis. Aortic atherosclerosis.  Cardiomegaly. No acute findings in the abdomen or pelvis. Electronically Signed   By: Rolm Baptise M.D.   On: 03/05/2018 18:08    EKG: Independently reviewed.  It shows normal sinus rhythm with a rate of 73, left bundle branch block, T wave inversion in the lateral leads with ST depression but appears to be chronic.  Assessment/Plan Principal Problem:   Rectal bleed Active Problems:   Type 2 diabetes mellitus with stage 2 chronic kidney disease, without long-term current use  of insulin ([place])   Chronic renal disease, stage II     #1 rectal bleed: Most likely diverticular bleed.  Patient will be admitted and placed on clear liquid diet.  Placed on monitored bed.  IV Protonix.  Avoid aspirin as well as ibuprofen which will be discontinued.  Gastroenterology consulted to evaluate patient in the morning.  She will be in observation and hopefully may be discharged tomorrow if normal episode of GI bleed.  Counseled regarding diet was given once again.  Type and crossmatch for 2 units of packed red blood cells on hold  #2 diabetes: Sliding scale insulin with home regimen.  #3 chronic kidney disease stage II: Continue to monitor renal function.  #4 hypertension: Blood pressure so far controlled.  Holding blood pressure medications but resume when patient is no longer bleeding.  #5 thrombocytosis: Probably reactive.  Continue monitoring  DVT prophylaxis: SCD Code Status: Full code Family Communication: Daughter and son at bedside Disposition Plan: Home Consults called: Dr. Collene Mares, gastroenterology Admission status: Observation  Severity of Illness: The appropriate patient status for this patient is OBSERVATION. Observation status is judged to be reasonable and necessary in order to provide the required intensity of service to ensure the patient's safety. The patient's presenting symptoms, physical exam findings, and initial radiographic and laboratory data  in the context of their medical condition is felt to place them at decreased risk for further clinical deterioration. Furthermore, it is anticipated that the patient will be medically stable for discharge from the hospital within 2 midnights of admission. The following factors support the patient status of observation.   " The patient's presenting symptoms include rectal bleed. " The physical exam findings include no significant finding except for positive stool guaiac. " The initial radiographic and laboratory data are still guaiac is negative with hemoglobin stable.     Barbette Merino MD [facility] Pager [phone]  If 7PM-7AM, please contact night-coverage [email or web address] Password [facility]  03/05/2018, 8:48 PM

## 2018-03-05 NOTE — ED Provider Notes (Signed)
[facility] EMERGENCY DEPARTMENT Provider Note   CSN: [id number] Arrival date & time: 03/05/18  1446    History   Chief Complaint Chief Complaint  Patient presents with  . GI Bleeding    HPI Mariah Aguilar is a 85 y.o. female.     86 year old female with prior medical history as detailed below presents for evaluation of reported rectal bleeding.  Patient reports acute onset of bright red blood per rectum with bowel movements starting last night.  She reports 2 bowel movements since last night.  She is experienced bright red blood per rectum with each bowel movement.  She denies hematemesis.  She denies nausea.  She does report vague left lower quadrant abdominal discomfort.  This is been ongoing for the last 4 to 5 days.  She denies associated fever.  She denies prior history of significant GI bleed.  She denies prior history of transfusion.  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  She takes one baby ASA daily.   The history is provided by medical records and the patient.  Illness  Location:  [facility] Severity:  Mild Onset quality:  Gradual Duration:  1 day Timing:  Rare Progression:  Waxing and waning Chronicity:  New Associated symptoms: abdominal pain     Past Medical History:  Diagnosis Date  . Arthritis   . Cardiomyopathy   . Cecal neoplasm   . CHF (congestive heart failure) ([place])   . Colon polyps   . Diverticulosis   . Hyperlipidemia   . Hypertension   . Insomnia   . Internal hemorrhoids   . Left bundle branch block (LBBB) on electrocardiogram    EKG of 5'12 shows 1st degree AV block/LBBB -Epic.    Patient Active Problem List   Diagnosis Date Noted  . Type 2 diabetes mellitus with stage 2 chronic kidney disease, without long-term current use of insulin ([place]) 12/24/2017  . Chronic renal disease, stage II 12/24/2017  . 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  Estrogen deficiency 12/24/2017  . Fracture of superior pubic ramus ([place]) 05/08/2012  . Inferior pubic ramus fracture ([place]) 05/08/2012  . Osteoarthritis of right knee 08/19/2011  . Scoliosis of lumbar spine 05/21/2011  . Scoliosis of thoracic spine 05/21/2011  . Postop check 07/24/2010    Past Surgical History:  Procedure Laterality Date  . ABDOMINAL HYSTERECTOMY    . COLON SURGERY    . COLONOSCOPY  05/27/2011   Procedure: COLONOSCOPY;  Surgeon: Juanita Craver, MD;  Location: [facility];  Service: Endoscopy;  Laterality: N/A;  . COLONOSCOPY WITH PROPOFOL N/A 06/30/2014   Procedure: COLONOSCOPY WITH PROPOFOL;  Surgeon: Juanita Craver, MD;  Location: [facility];  Service: Endoscopy;  Laterality: N/A;     OB History    Gravida  1   Para      Term      Preterm      AB      Living        SAB      TAB      Ectopic      Multiple      Live Births               Home Medications    Prior to Admission medications   Medication Sig Start Date End Date Taking? Authorizing Provider  amLODipine-valsartan (EXFORGE) 10-320 MG per tablet  12/20/13   [provider]  carvedilol (COREG) 12.5 MG tablet  11/10/13   [provider]  celecoxib (CELEBREX) 100 MG capsule TAKE 1 CAPSULE (100 MG TOTAL) BY MOUTH DAILY. 03/29/13   Kirsteins, Luanna Salk, MD  dexlansoprazole (DEXILANT) 60 MG capsule Take 1 capsule (60 mg total) by mouth daily. 02/20/18   Glendale Chard, MD  diazepam (VALIUM) 5 MG tablet 2.5 mg.  12/11/13   [provider]  digoxin (LANOXIN) 0.125 MG tablet Take 125 mcg by mouth daily. 10/07/13   [provider]  esomeprazole (NEXIUM) 40 MG capsule Take 40 mg by mouth daily.    [provider]  furosemide (LASIX) 40 MG tablet Take 40 mg by mouth 2 (two) times daily.      [provider]  ibuprofen (ADVIL,MOTRIN) 600 MG tablet Take 600 mg by mouth 3 (three) times daily as needed. 02/08/15   [provider]  Iron Polysacch  Cmplx-B12-FA (NIFEREX-150 FORTE PO) Take by mouth.    [provider]  potassium chloride (KLOR-CON) 8 MEQ CR tablet Take 8 mEq by mouth daily.      [provider]  Probiotic Product (RESTORA PO) Take by mouth.    [provider]  rosuvastatin (CRESTOR) 10 MG tablet Take 10 mg by mouth daily.      [provider]  traMADol (ULTRAM) 50 MG tablet TAKE 1 TABLET TWICE DAILY AS NEEDED FOR PAIN 10/13/17   [provider]    Family History Family History  Problem Relation Age of Onset  . Cancer Mother        COLON  . Stroke Father     Social History Social History   Tobacco Use  . Smoking status: Never Smoker  . Smokeless tobacco: Never Used  Substance Use Topics  . Alcohol use: No  . Drug use: No     Allergies   Penicillins   Review of Systems Review of Systems  Gastrointestinal: Positive for abdominal pain and blood in stool.  All other systems reviewed and are negative.    Physical Exam Updated Vital Signs BP (!) 142/78 (BP Location: Right Arm)   Pulse 66   Temp 98.5 F (36.9 C) (Oral)   Resp 18   SpO2 97%   Physical Exam Vitals signs and nursing note reviewed.  Constitutional:      General: She is not in acute distress.    Appearance: Normal appearance. She is well-developed.  HENT:     Head: Normocephalic and atraumatic.  Eyes:     Conjunctiva/sclera: Conjunctivae normal.     Pupils: Pupils are equal, round, and reactive to light.  Neck:     Musculoskeletal: Normal range of motion and neck supple.  Cardiovascular:     Rate and Rhythm: Normal rate and regular rhythm.     Heart sounds: Normal heart sounds.  Pulmonary:     Effort: Pulmonary effort is normal. No respiratory distress.     Breath sounds: Normal breath sounds.  Abdominal:     General: There is no distension.     Palpations: Abdomen is soft.     Tenderness: There is no abdominal tenderness.  Genitourinary:    Comments: Rectal exam is positive for  blood with bright red blood on the examining finger. Musculoskeletal: Normal range of motion.        General: No deformity.  Skin:    General: Skin is warm and dry.  Neurological:     Mental Status: She is alert and oriented to person, place, and time.  ED Treatments / Results  Labs (all labs ordered are listed, but only abnormal results are displayed) Labs Reviewed  COMPREHENSIVE METABOLIC PANEL - Abnormal; Notable for the following components:      Result Value   Glucose, Bld 135 (*)    Calcium 8.8 (*)    All other components within normal limits  CBC - Abnormal; Notable for the following components:   Hemoglobin 10.9 (*)    MCH 25.4 (*)    RDW 17.8 (*)    Platelets 742 (*)    All other components within normal limits  LIPASE, BLOOD  POC OCCULT BLOOD, ED  TYPE AND SCREEN    EKG EKG Interpretation  Date/Time:  Thursday March 05 2018 14:56:08 EST Ventricular Rate:  73 PR Interval:    QRS Duration: 157 QT Interval:  435 QTC Calculation: 480 R Axis:   -17 Text Interpretation:  Sinus rhythm Left bundle branch block Confirmed by Dene Gentry [phone]) on 03/05/2018 3:11:36 PM   Radiology Ct Abdomen Pelvis W Contrast  Result Date: 03/05/2018 CLINICAL DATA:  Abdominal pain EXAM: CT ABDOMEN AND PELVIS WITH CONTRAST TECHNIQUE: Multidetector CT imaging of the abdomen and pelvis was performed using the standard protocol following bolus administration of intravenous contrast. CONTRAST:  123mL OMNIPAQUE IOHEXOL 300 MG/ML  SOLN COMPARISON:  03/16/2015 FINDINGS: Lower chest: Cardiomegaly.  No acute abnormality Hepatobiliary: No focal hepatic abnormality. Gallbladder unremarkable. Pancreas: No focal abnormality or ductal dilatation. Spleen: No focal abnormality.  Normal size. Adrenals/Urinary Tract: No renal or ureteral stones. No hydronephrosis. Adrenal glands and urinary bladder unremarkable. Stomach/Bowel: Diffuse colonic diverticulosis. No active diverticulitis. Stomach and  small bowel unremarkable. No evidence of obstruction. Vascular/Lymphatic: Aortic atherosclerosis. No enlarged abdominal or pelvic lymph nodes. Reproductive: Prior hysterectomy. No adnexal masses. Pessary noted in place. Other: No free fluid or free air. Musculoskeletal: Advanced diffuse degenerative disc disease and facet disease. Lumbar scoliosis. IMPRESSION: Diffuse colonic diverticulosis.  No active diverticulitis. Aortic atherosclerosis.  Cardiomegaly. No acute findings in the abdomen or pelvis. Electronically Signed   By: Rolm Baptise M.D.   On: 03/05/2018 18:08    Procedures Procedures (including critical care time)  Medications Ordered in ED Medications - No data to display   Initial Impression / Assessment and Plan / ED Course  I have reviewed the triage vital signs and the nursing notes.  Pertinent labs & imaging results that were available during my care of the patient were reviewed by me and considered in my medical decision making (see chart for details).        MDM  Screen complete   Patient is presenting for evaluation of reported GI bleed.  Patient with evidence of bright red blood per rectum.  Patient with concurrent use of aspirin.  Patient's hemodynamics appear to be stable.  Initial hemoglobin is 10.9.  Patient without significant acute findings on CT abdomen pelvis.  Other screening labs performed are without significant abnormality.  Hospitalist service is aware of case and will evaluate for admission.  Patient is known to Dr. Collene Mares of GI.  She has been paged regarding the case.  Final Clinical Impressions(s) / ED Diagnoses   Final diagnoses:  Gastrointestinal hemorrhage, unspecified gastrointestinal hemorrhage type    ED Discharge Orders    None       Valarie Merino, MD 03/05/18 1902

## 2018-03-05 NOTE — Telephone Encounter (Signed)
The pt's daughter Ulla Gallo callled and said the pt has blood in her stool.  The pt declined an appointment with the NP and said she is going to urgent care.  I told her to make sure that she follow-up with her GI Dr. Collene Mares.

## 2018-03-06 DIAGNOSIS — K625 Hemorrhage of anus and rectum: Secondary | ICD-10-CM | POA: Diagnosis not present

## 2018-03-06 DIAGNOSIS — K5730 Diverticulosis of large intestine without perforation or abscess without bleeding: Secondary | ICD-10-CM

## 2018-03-06 DIAGNOSIS — E785 Hyperlipidemia, unspecified: Secondary | ICD-10-CM

## 2018-03-06 DIAGNOSIS — K922 Gastrointestinal hemorrhage, unspecified: Secondary | ICD-10-CM | POA: Diagnosis not present

## 2018-03-06 DIAGNOSIS — I10 Essential (primary) hypertension: Secondary | ICD-10-CM

## 2018-03-06 LAB — CBC
HCT: 29.9 % — ABNORMAL LOW (ref 36.0–46.0)
HEMATOCRIT: 30.1 % — AB (ref 36.0–46.0)
HEMOGLOBIN: 9.1 g/dL — AB (ref 12.0–15.0)
Hemoglobin: 9.5 g/dL — ABNORMAL LOW (ref 12.0–15.0)
MCH: 26.2 pg (ref 26.0–34.0)
MCH: 25.4 pg — ABNORMAL LOW (ref 26.0–34.0)
MCHC: 31.8 g/dL (ref 30.0–36.0)
MCHC: 30.2 g/dL (ref 30.0–36.0)
MCV: 82.6 fL (ref 80.0–100.0)
MCV: 84.1 fL (ref 80.0–100.0)
Platelets: 650 10*3/uL — ABNORMAL HIGH (ref 150–400)
Platelets: 638 10*3/uL — ABNORMAL HIGH (ref 150–400)
RBC: 3.62 MIL/uL — ABNORMAL LOW (ref 3.87–5.11)
RBC: 3.58 MIL/uL — ABNORMAL LOW (ref 3.87–5.11)
RDW: 17.7 % — ABNORMAL HIGH (ref 11.5–15.5)
RDW: 17.8 % — ABNORMAL HIGH (ref 11.5–15.5)
WBC: 7.5 10*3/uL (ref 4.0–10.5)
WBC: 7.5 10*3/uL (ref 4.0–10.5)
nRBC: 0 % (ref 0.0–0.2)
nRBC: 0 % (ref 0.0–0.2)

## 2018-03-06 LAB — COMPREHENSIVE METABOLIC PANEL
ALT: 15 U/L (ref 0–44)
AST: 18 U/L (ref 15–41)
Albumin: 3.1 g/dL — ABNORMAL LOW (ref 3.5–5.0)
Alkaline Phosphatase: 36 U/L — ABNORMAL LOW (ref 38–126)
Anion gap: 6 (ref 5–15)
BUN: 10 mg/dL (ref 8–23)
CO2: 25 mmol/L (ref 22–32)
Calcium: 7.8 mg/dL — ABNORMAL LOW (ref 8.9–10.3)
Chloride: 105 mmol/L (ref 98–111)
Creatinine, Ser: 0.85 mg/dL (ref 0.44–1.00)
GFR calc Af Amer: >60 mL/min (ref >60)
GFR calc non Af Amer: 59 mL/min — ABNORMAL LOW (ref >60)
Glucose, Bld: 188 mg/dL — ABNORMAL HIGH (ref 70–99)
POTASSIUM: 3.5 mmol/L (ref 3.5–5.1)
Sodium: 136 mmol/L (ref 135–145)
Total Bilirubin: 0.4 mg/dL (ref 0.3–1.2)
Total Protein: 6.5 g/dL (ref 6.5–8.1)

## 2018-03-06 LAB — GLUCOSE, CAPILLARY
Glucose-Capillary: 98 mg/dL (ref 70–99)
Glucose-Capillary: 98 mg/dL (ref 70–99)

## 2018-03-06 NOTE — Discharge Summary (Signed)
Physician Discharge Summary  [name] STM:[id number] DOB: Nov 11, 1922 DOA: 03/05/2018  PCP: Glendale Chard, MD  Admit date: 03/05/2018 Discharge date: 03/06/2018  Admitted From: Home Disposition:  Home  Recommendations for Outpatient Follow-up:  1. Follow up with Dr. Collene Mares in office on Tuesday  2. Repeat CBC as outpatient  3. Stop all NSAID   Discharge Condition: Stable CODE STATUS: Full  Diet recommendation: Regular diet   Brief/Interim Summary: From H&P by Dr. Jonelle Sidle: Mariah Aguilar is a 85 y.o. 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  Patient had 2 bowel movements that were bright red blood per rectum.  She initially saw it while she wiped herself.  Denied any nausea vomiting or hematemesis.  Denied any injury.  Bleeding again is painless.  Patient has not had any more bleed since arrival in the ER.  No fever or chills.  She has had vague lower abdominal pain for the last 4 to 5 days.  Patient says she has taken some food she should not take it.  She has been avoiding seeds nuts broccoli etc. due to known history of diverticulosis.  She however took something in the last couple of days that she believes may have caused this.  She is also taking aspirin as well as ibuprofen.  She occasionally takes Tylenol but mostly these 2.  So far she is hemodynamically stable and hemoglobin is staying stable at around 11 g.  She is however being admitted for observation for what is believed to be a diverticular bleed.  ED Course: Vitals are stable with blood pressure 146/65.  Her white count is 9.8 hemoglobin 10.9 down from 11.5 last month.  Platelets of 742.  Electrolytes appear to be normal except for calcium of 8.8.  CT abdomen pelvis showed no acute findings.  Only diffuse  diverticulosis.  Patient is therefore being admitted with acute lower GI bleed most likely diverticular bleed.  Interim: Her VS has remained stable, Hgb stable at 9.1. She had a colonoscopy in 2016 by Dr. Collene Mares which revealed pandiverticulosis. She has had no further episodes of bleeding. I discussed case with Dr. Collene Mares. She advised patient to follow up with her in office next week.   Discharge Diagnoses:  Principal Problem:   Rectal bleed Active Problems:   Type 2 diabetes mellitus with stage 2 chronic kidney disease, without long-term current use of insulin ([facility])   Chronic renal disease, stage II   Large bowel diverticular disease   HLD (hyperlipidemia)   HTN (hypertension)   Discharge Instructions  Discharge Instructions    Call MD for:   Complete by:  As directed    Further episodes of rectal bleeding   Call MD for:  difficulty breathing, headache or visual disturbances   Complete by:  As directed    Call MD for:  extreme fatigue   Complete by:  As directed    Call MD for:  persistant dizziness or light-headedness   Complete by:  As directed    Call MD for:  persistant nausea and vomiting   Complete by:  As directed    Call MD for:  severe uncontrolled pain   Complete by:  As directed    Call MD for:  temperature >100.4   Complete by:  As directed    Diet - low sodium heart healthy   Complete  by:  As directed    Discharge instructions   Complete by:  As directed    You were cared for by a hospitalist during your hospital stay. If you have any questions about your discharge medications or the care you received while you were in the hospital after you are discharged, you can call the unit and ask to speak with the hospitalist on call if the hospitalist that took care of you is not available. Once you are discharged, your primary care physician will handle any further medical issues. Please note that NO REFILLS for any discharge medications will be authorized once you are  discharged, as it is imperative that you return to your primary care physician (or establish a relationship with a primary care physician if you do not have one) for your aftercare needs so that they can reassess your need for medications and monitor your lab values.   Increase activity slowly   Complete by:  As directed      Allergies as of 03/06/2018      Reactions   Shrimp [shellfish Allergy] Itching, Swelling   Penicillins Hives   Did it involve swelling of the face/tongue/throat, SOB, or low BP? Unknown Did it involve sudden or severe rash/hives, skin peeling, or any reaction on the inside of your mouth or nose? Unknown Did you need to seek medical attention at a hospital or doctor's office? Unknown When did it last happen? If all above answers are "NO", may proceed with cephalosporin use.      Medication List    STOP taking these medications   aspirin EC 81 MG tablet   celecoxib 100 MG capsule Commonly known as:  CELEBREX   ibuprofen 600 MG tablet Commonly known as:  ADVIL,MOTRIN     TAKE these medications   amLODipine-valsartan 10-320 MG tablet Commonly known as:  EXFORGE   carvedilol 12.5 MG tablet Commonly known as:  COREG Take 12.5 mg by mouth 2 (two) times daily with a meal.   dexlansoprazole 60 MG capsule Commonly known as:  DEXILANT Take 1 capsule (60 mg total) by mouth daily.   diazepam 5 MG tablet Commonly known as:  VALIUM Take 5 mg by mouth at bedtime as needed for sedation.   digoxin 0.125 MG tablet Commonly known as:  LANOXIN Take 0.125 mg by mouth daily.   furosemide 40 MG tablet Commonly known as:  LASIX Take 40 mg by mouth daily.   NIFEREX-150 FORTE PO Take 150 mg by mouth daily.   potassium chloride 8 MEQ CR tablet Commonly known as:  KLOR-CON Take 8 mEq by mouth daily.   RESTORA PO Take 1 tablet by mouth daily.   rosuvastatin 10 MG tablet Commonly known as:  CRESTOR Take 10 mg by mouth daily.   traMADol 50 MG  tablet Commonly known as:  ULTRAM Take 50 mg by mouth as needed for moderate pain.      Follow-up Information    Juanita Craver, MD. Schedule an appointment as soon as possible for a visit on 03/10/2018.   Specialty:  Gastroenterology Why:  Follow up regarding rectal bleed.  Contact information: [address] [place] [address] [phone]          Allergies  Allergen Reactions  . Shrimp [Shellfish Allergy] Itching and Swelling  . Penicillins Hives    Did it involve swelling of the face/tongue/throat, SOB, or low BP? Unknown Did it involve sudden or severe rash/hives, skin peeling, or any reaction on  the inside of your mouth or nose? Unknown Did you need to seek medical attention at a hospital or doctor's office? Unknown When did it last happen? If all above answers are "NO", may proceed with cephalosporin use.    Consultations:  GI, spoke with Dr. Collene Mares over the phone    Procedures/Studies: Ct Abdomen Pelvis W Contrast  Result Date: 03/05/2018 CLINICAL DATA:  Abdominal pain EXAM: CT ABDOMEN AND PELVIS WITH CONTRAST TECHNIQUE: Multidetector CT imaging of the abdomen and pelvis was performed using the standard protocol following bolus administration of intravenous contrast. CONTRAST:  181mL OMNIPAQUE IOHEXOL 300 MG/ML  SOLN COMPARISON:  03/16/2015 FINDINGS: Lower chest: Cardiomegaly.  No acute abnormality Hepatobiliary: No focal hepatic abnormality. Gallbladder unremarkable. Pancreas: No focal abnormality or ductal dilatation. Spleen: No focal abnormality.  Normal size. Adrenals/Urinary Tract: No renal or ureteral stones. No hydronephrosis. Adrenal glands and urinary bladder unremarkable. Stomach/Bowel: Diffuse colonic diverticulosis. No active diverticulitis. Stomach and small bowel unremarkable. No evidence of obstruction. Vascular/Lymphatic: Aortic atherosclerosis. No enlarged abdominal or pelvic lymph nodes. Reproductive: Prior hysterectomy. No adnexal  masses. Pessary noted in place. Other: No free fluid or free air. Musculoskeletal: Advanced diffuse degenerative disc disease and facet disease. Lumbar scoliosis. IMPRESSION: Diffuse colonic diverticulosis.  No active diverticulitis. Aortic atherosclerosis.  Cardiomegaly. No acute findings in the abdomen or pelvis. Electronically Signed   By: Rolm Baptise M.D.   On: 03/05/2018 18:08       Discharge Exam: Vitals:   03/06/18 0521 03/06/18 1143  BP: 135/63 122/65  Pulse: 73 66  Resp: 18   Temp: 98.7 F (37.1 C) 98.4 F (36.9 C)  SpO2: 93% 100%    General: Pt is alert, awake, not in acute distress Cardiovascular: RRR, S1/S2 +, no rubs, no gallops Respiratory: CTA bilaterally, no wheezing, no rhonchi Abdominal: Soft, NT, ND, bowel sounds + Extremities: no edema, no cyanosis    The results of significant diagnostics from this hospitalization (including imaging, microbiology, ancillary and laboratory) are listed below for reference.     Microbiology: No results found for this or any previous visit (from the past 240 hour(s)).   Labs: BNP (last 3 results) No results for input(s): BNP in the last 8760 hours. Basic Metabolic Panel: Recent Labs  Lab 03/05/18 1524 03/06/18 0910  NA 140 136  K 3.6 3.5  CL 106 105  CO2 26 25  GLUCOSE 135* 188*  BUN 10 10  CREATININE 0.75 0.85  CALCIUM 8.8* 7.8*   Liver Function Tests: Recent Labs  Lab 03/05/18 1524 03/06/18 0910  AST 18 18  ALT 16 15  ALKPHOS 43 36*  BILITOT 0.3 0.4  PROT 7.7 6.5  ALBUMIN 3.7 3.1*   Recent Labs  Lab 03/05/18 1524  LIPASE 22   No results for input(s): AMMONIA in the last 168 hours. CBC: Recent Labs  Lab 03/05/18 1524 03/05/18 2126 03/06/18 0316 03/06/18 0910  WBC 9.8 8.2 7.5 7.5  HGB 10.9* 10.3* 9.5* 9.1*  HCT 36.0 33.2* 29.9* 30.1*  MCV 83.9 83.0 82.6 84.1  PLT 742* 695* 650* 638*   Cardiac Enzymes: No results for input(s): CKTOTAL, CKMB, CKMBINDEX, TROPONINI in the last 168  hours. BNP: Invalid input(s): POCBNP CBG: Recent Labs  Lab 03/05/18 2150 03/06/18 0612 03/06/18 1141  GLUCAP 109* 98 98   D-Dimer No results for input(s): DDIMER in the last 72 hours. Hgb A1c No results for input(s): HGBA1C in the last 72 hours. Lipid Profile No results for input(s): CHOL, HDL, LDLCALC, TRIG, CHOLHDL,  LDLDIRECT in the last 72 hours. Thyroid function studies No results for input(s): TSH, T4TOTAL, T3FREE, THYROIDAB in the last 72 hours.  Invalid input(s): FREET3 Anemia work up No results for input(s): VITAMINB12, FOLATE, FERRITIN, TIBC, IRON, RETICCTPCT in the last 72 hours. Urinalysis    Component Value Date/Time   BILIRUBINUR negative 02/25/2018 1708   PROTEINUR Negative 02/25/2018 1708   UROBILINOGEN 0.2 02/25/2018 1708   NITRITE negative 02/25/2018 1708   LEUKOCYTESUR Negative 02/25/2018 1708   Sepsis Labs Invalid input(s): PROCALCITONIN,  WBC,  LACTICIDVEN Microbiology No results found for this or any previous visit (from the past 240 hour(s)).   Patient was seen and examined on the day of discharge and was found to be in stable condition. Time coordinating discharge: 40 minutes including assessment and coordination of care, as well as examination of the patient.   SIGNED:  Dessa Phi, DO [facility] [email or web address] 03/06/2018, 12:59 PM

## 2018-03-06 NOTE — Progress Notes (Signed)
Patient arrived to the unit and was placed on telemetry.  Family at bedside.  Educated on the use of call bell and call bell placed with patient.  Patient and family requested not to use bed alarm since patient is able to move well and family will be with her in the room at all times.  Patient has no complaints at this time.

## 2018-03-10 DIAGNOSIS — K5730 Diverticulosis of large intestine without perforation or abscess without bleeding: Secondary | ICD-10-CM | POA: Diagnosis not present

## 2018-03-10 DIAGNOSIS — Z86010 Personal history of colonic polyps: Secondary | ICD-10-CM | POA: Diagnosis not present

## 2018-03-10 DIAGNOSIS — Z800 Family history of malignant neoplasm of digestive organs: Secondary | ICD-10-CM | POA: Diagnosis not present

## 2018-03-15 ENCOUNTER — Other Ambulatory Visit: Payer: Self-pay | Admitting: Internal Medicine

## 2018-03-25 ENCOUNTER — Ambulatory Visit: Payer: Medicare Other | Admitting: Internal Medicine

## 2018-04-02 ENCOUNTER — Ambulatory Visit: Payer: Medicare Other | Admitting: Internal Medicine

## 2018-04-11 ENCOUNTER — Other Ambulatory Visit: Payer: Self-pay | Admitting: Internal Medicine

## 2018-05-07 ENCOUNTER — Other Ambulatory Visit: Payer: Self-pay | Admitting: Internal Medicine

## 2018-05-19 ENCOUNTER — Ambulatory Visit (INDEPENDENT_AMBULATORY_CARE_PROVIDER_SITE_OTHER): Payer: Medicare Other | Admitting: Internal Medicine

## 2018-05-19 ENCOUNTER — Other Ambulatory Visit: Payer: Self-pay

## 2018-05-19 ENCOUNTER — Encounter: Payer: Self-pay | Admitting: Internal Medicine

## 2018-05-19 VITALS — BP 128/76 | HR 62 | Temp 98.2°F | Ht 65.0 in | Wt 135.0 lb

## 2018-05-19 DIAGNOSIS — N182 Chronic kidney disease, stage 2 (mild): Secondary | ICD-10-CM | POA: Diagnosis not present

## 2018-05-19 DIAGNOSIS — D649 Anemia, unspecified: Secondary | ICD-10-CM

## 2018-05-19 DIAGNOSIS — I129 Hypertensive chronic kidney disease with stage 1 through stage 4 chronic kidney disease, or unspecified chronic kidney disease: Secondary | ICD-10-CM

## 2018-05-19 DIAGNOSIS — E1122 Type 2 diabetes mellitus with diabetic chronic kidney disease: Secondary | ICD-10-CM

## 2018-05-19 LAB — POCT URINALYSIS DIPSTICK
Bilirubin, UA: NEGATIVE
Blood, UA: NEGATIVE
Glucose, UA: NEGATIVE
Ketones, UA: NEGATIVE
Leukocytes, UA: NEGATIVE
Nitrite, UA: NEGATIVE
Protein, UA: NEGATIVE
Spec Grav, UA: 1.015 (ref 1.010–1.025)
Urobilinogen, UA: 0.2 E.U./dL
pH, UA: 7 (ref 5.0–8.0)

## 2018-05-19 LAB — POCT UA - MICROALBUMIN
Albumin/Creatinine Ratio, Urine, POC: <30
Creatinine, POC: 50 mg/dL
Microalbumin Ur, POC: 10 mg/L

## 2018-05-19 NOTE — Progress Notes (Signed)
Subjective:     Patient ID: Mariah Aguilar , female    DOB: Feb 18, 1922 , 85 y.o.   MRN: [id number]   Chief Complaint  Patient presents with  . Diabetes  . Hypertension    HPI  Diabetes  She presents for her follow-up diabetic visit. She has type 2 diabetes mellitus. Her disease course has been stable. There are no hypoglycemic associated symptoms. Pertinent negatives for diabetes include no blurred vision and no chest pain. There are no hypoglycemic complications. Diabetic complications include nephropathy. Risk factors for coronary artery disease include diabetes mellitus, hypertension and post-menopausal. She is following a diabetic diet. She participates in exercise three times a week. An ACE inhibitor/angiotensin II receptor blocker is not being taken. Eye exam is current.  Hypertension  This is a chronic problem. The current episode started more than 1 year ago. The problem has been gradually improving since onset. The problem is controlled. Pertinent negatives include no blurred vision or chest pain. Hypertensive end-organ damage includes kidney disease.   She reports compliance with meds. She is also followed by Dr. Terrence Dupont.   Past Medical History:  Diagnosis Date  . Arthritis   . Cardiomyopathy   . Cecal neoplasm   . CHF (congestive heart failure) ([place])   . Colon polyps   . Diverticulosis   . Hyperlipidemia   . Hypertension   . Insomnia   . Internal hemorrhoids   . Left bundle branch block (LBBB) on electrocardiogram    EKG of 5'12 shows 1st degree AV block/LBBB -Epic.     Family History  Problem Relation Age of Onset  . Cancer Mother        COLON  . Stroke Father   . Diabetes Sister      Current Outpatient Medications:  .  amLODipine-valsartan (EXFORGE) 10-320 MG per tablet, , Disp: , Rfl:  .  carvedilol (COREG) 12.5 MG tablet, Take 12.5 mg by mouth 2 (two) times daily with a meal. , Disp: , Rfl: 3 .  DEXILANT 60 MG capsule, TAKE 1 CAPSULE BY MOUTH EVERY  DAY, Disp: 30 capsule, Rfl: 1 .  diazepam (VALIUM) 5 MG tablet, Take 5 mg by mouth at bedtime as needed for sedation. , Disp: , Rfl: 1 .  digoxin (LANOXIN) 0.125 MG tablet, Take 0.125 mg by mouth daily. , Disp: , Rfl: 3 .  furosemide (LASIX) 40 MG tablet, Take 40 mg by mouth daily. , Disp: , Rfl:  .  Iron Polysacch Cmplx-B12-FA (NIFEREX-150 FORTE PO), Take 150 mg by mouth daily. , Disp: , Rfl:  .  potassium chloride (KLOR-CON) 8 MEQ CR tablet, Take 8 mEq by mouth daily.  , Disp: , Rfl:  .  Probiotic Product (RESTORA PO), Take 1 tablet by mouth daily. , Disp: , Rfl:  .  rosuvastatin (CRESTOR) 10 MG tablet, Take 10 mg by mouth daily.  , Disp: , Rfl:  .  traMADol (ULTRAM) 50 MG tablet, Take 50 mg by mouth as needed for moderate pain. , Disp: , Rfl: 0   Allergies  Allergen Reactions  . Shrimp [Shellfish Allergy] Itching and Swelling  . Penicillins Hives    Did it involve swelling of the face/tongue/throat, SOB, or low BP? Unknown Did it involve sudden or severe rash/hives, skin peeling, or any reaction on the inside of your mouth or nose? Unknown Did you need to seek medical attention at a hospital or doctor's office? Unknown When did it last happen? If all above answers are "NO", may  proceed with cephalosporin use.     Review of Systems  Constitutional: Negative.   Eyes: Negative for blurred vision.  Respiratory: Negative.   Cardiovascular: Negative.  Negative for chest pain.  Gastrointestinal: Negative.   Neurological: Negative.   Psychiatric/Behavioral: Negative.      Today's Vitals   05/19/18 1019  BP: 128/76  Pulse: 62  Temp: 98.2 F (36.8 C)  TempSrc: Oral  Weight: 135 lb (61.2 kg)  Height: 5' 5"  (1.651 m)  PainSc: 0-No pain   Body mass index is 22.47 kg/m.   Objective:  Physical Exam Vitals signs and nursing note reviewed.  Constitutional:      Appearance: Normal appearance.  HENT:     Head: Normocephalic and atraumatic.  Cardiovascular:     Rate and  Rhythm: Normal rate and regular rhythm.     Heart sounds: Normal heart sounds.  Pulmonary:     Effort: Pulmonary effort is normal.     Breath sounds: Normal breath sounds.  Skin:    General: Skin is warm.  Neurological:     General: No focal deficit present.     Mental Status: She is alert.  Psychiatric:        Mood and Affect: Mood normal.        Behavior: Behavior normal.         Assessment And Plan:     1. Type 2 diabetes mellitus with stage 2 chronic kidney disease, without long-term current use of insulin ([place])  I will check labs as listed below. Importance of dietary compliance was discussed with the patient.   - Lipid panel - CMP14+EGFR - Hemoglobin A1c - POCT Urinalysis Dipstick (81002) - POCT UA - Microalbumin  2. Parenchymal renal hypertension, stage 1 through stage 4 or unspecified chronic kidney disease  Well controlled. She will continue with current meds. She is encouraged to avoid adding salt to her foods. She is encouraged to continue with her regular exercise regimen.   3. Anemia, unspecified type  I will recheck her iron indices as listed below. I will make further recommendations once her labs are available for review. She denies gingival bleeding and blood in her stools.  - Protein electrophoresis, serum - Iron and IBC ([id number]) - CBC no Diff        Maximino Greenland, MD    THE PATIENT IS ENCOURAGED TO PRACTICE SOCIAL DISTANCING DUE TO THE COVID-19 PANDEMIC.

## 2018-05-19 NOTE — Patient Instructions (Signed)
Diabetes Mellitus and Nutrition, Adult  When you have diabetes (diabetes mellitus), it is very important to have healthy eating habits because your blood sugar (glucose) levels are greatly affected by what you eat and drink. Eating healthy foods in the appropriate amounts, at about the same times every day, can help you:  · Control your blood glucose.  · Lower your risk of heart disease.  · Improve your blood pressure.  · Reach or maintain a healthy weight.  Every person with diabetes is different, and each person has different needs for a meal plan. Your health care provider may recommend that you work with a diet and nutrition specialist (dietitian) to make a meal plan that is best for you. Your meal plan may vary depending on factors such as:  · The calories you need.  · The medicines you take.  · Your weight.  · Your blood glucose, blood pressure, and cholesterol levels.  · Your activity level.  · Other health conditions you have, such as heart or kidney disease.  How do carbohydrates affect me?  Carbohydrates, also called carbs, affect your blood glucose level more than any other type of food. Eating carbs naturally raises the amount of glucose in your blood. Carb counting is a method for keeping track of how many carbs you eat. Counting carbs is important to keep your blood glucose at a healthy level, especially if you use insulin or take certain oral diabetes medicines.  It is important to know how many carbs you can safely have in each meal. This is different for every person. Your dietitian can help you calculate how many carbs you should have at each meal and for each snack.  Foods that contain carbs include:  · Bread, cereal, rice, pasta, and crackers.  · Potatoes and corn.  · Peas, beans, and lentils.  · Milk and yogurt.  · Fruit and juice.  · Desserts, such as cakes, cookies, ice cream, and candy.  How does alcohol affect me?  Alcohol can cause a sudden decrease in blood glucose (hypoglycemia),  especially if you use insulin or take certain oral diabetes medicines. Hypoglycemia can be a life-threatening condition. Symptoms of hypoglycemia (sleepiness, dizziness, and confusion) are similar to symptoms of having too much alcohol.  If your health care provider says that alcohol is safe for you, follow these guidelines:  · Limit alcohol intake to no more than 1 drink per day for nonpregnant women and 2 drinks per day for men. One drink equals 12 oz of beer, 5 oz of wine, or 1½ oz of hard liquor.  · Do not drink on an empty stomach.  · Keep yourself hydrated with water, diet soda, or unsweetened iced tea.  · Keep in mind that regular soda, juice, and other mixers may contain a lot of sugar and must be counted as carbs.  What are tips for following this plan?    Reading food labels  · Start by checking the serving size on the "Nutrition Facts" label of packaged foods and drinks. The amount of calories, carbs, fats, and other nutrients listed on the label is based on one serving of the item. Many items contain more than one serving per package.  · Check the total grams (g) of carbs in one serving. You can calculate the number of servings of carbs in one serving by dividing the total carbs by 15. For example, if a food has 30 g of total carbs, it would be equal to 2   servings of carbs.  · Check the number of grams (g) of saturated and trans fats in one serving. Choose foods that have low or no amount of these fats.  · Check the number of milligrams (mg) of salt (sodium) in one serving. Most people should limit total sodium intake to less than 2,300 mg per day.  · Always check the nutrition information of foods labeled as "low-fat" or "nonfat". These foods may be higher in added sugar or refined carbs and should be avoided.  · Talk to your dietitian to identify your daily goals for nutrients listed on the label.  Shopping  · Avoid buying canned, premade, or processed foods. These foods tend to be high in fat, sodium,  and added sugar.  · Shop around the outside edge of the grocery store. This includes fresh fruits and vegetables, bulk grains, fresh meats, and fresh dairy.  Cooking  · Use low-heat cooking methods, such as baking, instead of high-heat cooking methods like deep frying.  · Cook using healthy oils, such as olive, canola, or sunflower oil.  · Avoid cooking with butter, cream, or high-fat meats.  Meal planning  · Eat meals and snacks regularly, preferably at the same times every day. Avoid going long periods of time without eating.  · Eat foods high in fiber, such as fresh fruits, vegetables, beans, and whole grains. Talk to your dietitian about how many servings of carbs you can eat at each meal.  · Eat 4-6 ounces (oz) of lean protein each day, such as lean meat, chicken, fish, eggs, or tofu. One oz of lean protein is equal to:  ? 1 oz of meat, chicken, or fish.  ? 1 egg.  ? ¼ cup of tofu.  · Eat some foods each day that contain healthy fats, such as avocado, nuts, seeds, and fish.  Lifestyle  · Check your blood glucose regularly.  · Exercise regularly as told by your health care provider. This may include:  ? 150 minutes of moderate-intensity or vigorous-intensity exercise each week. This could be brisk walking, biking, or water aerobics.  ? Stretching and doing strength exercises, such as yoga or weightlifting, at least 2 times a week.  · Take medicines as told by your health care provider.  · Do not use any products that contain nicotine or tobacco, such as cigarettes and e-cigarettes. If you need help quitting, ask your health care provider.  · Work with a counselor or diabetes educator to identify strategies to manage stress and any emotional and social challenges.  Questions to ask a health care provider  · Do I need to meet with a diabetes educator?  · Do I need to meet with a dietitian?  · What number can I call if I have questions?  · When are the best times to check my blood glucose?  Where to find more  information:  · American Diabetes Association: diabetes.org  · Academy of Nutrition and Dietetics: www.eatright.org  · National Institute of Diabetes and Digestive and Kidney Diseases (NIH): www.niddk.nih.gov  Summary  · A healthy meal plan will help you control your blood glucose and maintain a healthy lifestyle.  · Working with a diet and nutrition specialist (dietitian) can help you make a meal plan that is best for you.  · Keep in mind that carbohydrates (carbs) and alcohol have immediate effects on your blood glucose levels. It is important to count carbs and to use alcohol carefully.  This information is not intended to   replace advice given to you by your health care provider. Make sure you discuss any questions you have with your health care provider.  Document Released: 09/20/2004 Document Revised: 07/24/2016 Document Reviewed: 01/29/2016  Elsevier Interactive Patient Education © 2019 Elsevier Inc.

## 2018-05-21 LAB — PROTEIN ELECTROPHORESIS, SERUM
A/G Ratio: 1 (ref 0.7–1.7)
Albumin ELP: 3.9 g/dL (ref 2.9–4.4)
Alpha 1: 0.3 g/dL (ref 0.0–0.4)
Alpha 2: 0.8 g/dL (ref 0.4–1.0)
Beta: 1.2 g/dL (ref 0.7–1.3)
Gamma Globulin: 1.7 g/dL (ref 0.4–1.8)
Globulin, Total: 4 g/dL — ABNORMAL HIGH (ref 2.2–3.9)

## 2018-05-21 LAB — CMP14+EGFR
ALT: 14 IU/L (ref 0–32)
AST: 16 IU/L (ref 0–40)
Albumin/Globulin Ratio: 1.5 (ref 1.2–2.2)
Albumin: 4.8 g/dL — ABNORMAL HIGH (ref 3.5–4.6)
Alkaline Phosphatase: 65 IU/L (ref 39–117)
BUN/Creatinine Ratio: 16 (ref 12–28)
BUN: 15 mg/dL (ref 10–36)
Bilirubin Total: 0.3 mg/dL (ref 0.0–1.2)
CO2: 25 mmol/L (ref 20–29)
Calcium: 9 mg/dL (ref 8.7–10.3)
Chloride: 99 mmol/L (ref 96–106)
Creatinine, Ser: 0.93 mg/dL (ref 0.57–1.00)
GFR calc Af Amer: 61 mL/min/{1.73_m2} (ref >59)
GFR calc non Af Amer: 53 mL/min/{1.73_m2} — ABNORMAL LOW (ref >59)
Globulin, Total: 3.1 g/dL (ref 1.5–4.5)
Glucose: 119 mg/dL — ABNORMAL HIGH (ref 65–99)
Potassium: 4.6 mmol/L (ref 3.5–5.2)
Sodium: 140 mmol/L (ref 134–144)
Total Protein: 7.9 g/dL (ref 6.0–8.5)

## 2018-05-21 LAB — HEMOGLOBIN A1C
Est. average glucose Bld gHb Est-mCnc: 140 mg/dL
Hgb A1c MFr Bld: 6.5 % — ABNORMAL HIGH (ref 4.8–5.6)

## 2018-05-21 LAB — CBC
Hematocrit: 37.4 % (ref 34.0–46.6)
Hemoglobin: 11.6 g/dL (ref 11.1–15.9)
MCH: 25.4 pg — ABNORMAL LOW (ref 26.6–33.0)
MCHC: 31 g/dL — ABNORMAL LOW (ref 31.5–35.7)
MCV: 82 fL (ref 79–97)
Platelets: 675 10*3/uL — ABNORMAL HIGH (ref 150–450)
RBC: 4.57 x10E6/uL (ref 3.77–5.28)
RDW: 17.3 % — ABNORMAL HIGH (ref 11.7–15.4)
WBC: 7.1 10*3/uL (ref 3.4–10.8)

## 2018-05-21 LAB — LIPID PANEL
Chol/HDL Ratio: 2.7 ratio (ref 0.0–4.4)
Cholesterol, Total: 123 mg/dL (ref 100–199)
HDL: 45 mg/dL (ref >39)
LDL Calculated: 60 mg/dL (ref 0–99)
Triglycerides: 89 mg/dL (ref 0–149)
VLDL Cholesterol Cal: 18 mg/dL (ref 5–40)

## 2018-05-21 LAB — IRON AND TIBC
Iron Saturation: 10 % — ABNORMAL LOW (ref 15–55)
Iron: 40 ug/dL (ref 27–139)
Total Iron Binding Capacity: 408 ug/dL (ref 250–450)
UIBC: 368 ug/dL (ref 118–369)

## 2018-05-29 ENCOUNTER — Other Ambulatory Visit: Payer: Self-pay | Admitting: Internal Medicine

## 2018-06-09 DIAGNOSIS — H353113 Nonexudative age-related macular degeneration, right eye, advanced atrophic without subfoveal involvement: Secondary | ICD-10-CM | POA: Diagnosis not present

## 2018-06-09 DIAGNOSIS — H353123 Nonexudative age-related macular degeneration, left eye, advanced atrophic without subfoveal involvement: Secondary | ICD-10-CM | POA: Diagnosis not present

## 2018-06-09 DIAGNOSIS — H353221 Exudative age-related macular degeneration, left eye, with active choroidal neovascularization: Secondary | ICD-10-CM | POA: Diagnosis not present

## 2018-06-09 DIAGNOSIS — H353211 Exudative age-related macular degeneration, right eye, with active choroidal neovascularization: Secondary | ICD-10-CM | POA: Diagnosis not present

## 2018-06-12 DIAGNOSIS — I340 Nonrheumatic mitral (valve) insufficiency: Secondary | ICD-10-CM | POA: Diagnosis not present

## 2018-06-12 DIAGNOSIS — E785 Hyperlipidemia, unspecified: Secondary | ICD-10-CM | POA: Diagnosis not present

## 2018-06-12 DIAGNOSIS — M1990 Unspecified osteoarthritis, unspecified site: Secondary | ICD-10-CM | POA: Diagnosis not present

## 2018-06-12 DIAGNOSIS — I10 Essential (primary) hypertension: Secondary | ICD-10-CM | POA: Diagnosis not present

## 2018-06-12 DIAGNOSIS — I38 Endocarditis, valve unspecified: Secondary | ICD-10-CM | POA: Diagnosis not present

## 2018-06-12 DIAGNOSIS — I5020 Unspecified systolic (congestive) heart failure: Secondary | ICD-10-CM | POA: Diagnosis not present

## 2018-06-12 DIAGNOSIS — E119 Type 2 diabetes mellitus without complications: Secondary | ICD-10-CM | POA: Diagnosis not present

## 2018-06-12 DIAGNOSIS — D649 Anemia, unspecified: Secondary | ICD-10-CM | POA: Diagnosis not present

## 2018-06-18 DIAGNOSIS — N8111 Cystocele, midline: Secondary | ICD-10-CM | POA: Diagnosis not present

## 2018-06-22 ENCOUNTER — Telehealth: Payer: Self-pay

## 2018-06-22 NOTE — Telephone Encounter (Signed)
I returned the pt's call and notified her that Dr. Baird Cancer said that the pt needed to be seen sooner than her next appt in Sept because the pt wanted to cancel the appt that was made for her to be evaluated for bronchitis and the pt said that she still wants to cancel the appt and that she is going to be ok.

## 2018-06-22 NOTE — Telephone Encounter (Signed)
The pt agreed to and was scheduled a phone visit for bronchitis evaluation.

## 2018-06-23 ENCOUNTER — Ambulatory Visit: Payer: Self-pay | Admitting: Internal Medicine

## 2018-06-23 ENCOUNTER — Telehealth: Payer: Self-pay

## 2018-06-23 NOTE — Telephone Encounter (Signed)
Verbal consent given for virtual visit  

## 2018-06-24 ENCOUNTER — Other Ambulatory Visit: Payer: Self-pay

## 2018-06-24 ENCOUNTER — Ambulatory Visit (INDEPENDENT_AMBULATORY_CARE_PROVIDER_SITE_OTHER): Payer: Medicare Other | Admitting: Internal Medicine

## 2018-06-24 ENCOUNTER — Encounter: Payer: Self-pay | Admitting: Internal Medicine

## 2018-06-24 VITALS — BP 130/67 | Temp 98.2°F | Ht 65.0 in

## 2018-06-24 DIAGNOSIS — J209 Acute bronchitis, unspecified: Secondary | ICD-10-CM

## 2018-06-24 DIAGNOSIS — Z712 Person consulting for explanation of examination or test findings: Secondary | ICD-10-CM | POA: Diagnosis not present

## 2018-06-24 DIAGNOSIS — D75839 Thrombocytosis, unspecified: Secondary | ICD-10-CM

## 2018-06-24 DIAGNOSIS — D473 Essential (hemorrhagic) thrombocythemia: Secondary | ICD-10-CM

## 2018-06-24 MED ORDER — BENZONATATE 100 MG PO CAPS: 100.0000 mg | ORAL_CAPSULE | Freq: Three times a day (TID) | ORAL | 1 refills | Status: DC | PRN

## 2018-06-24 NOTE — Progress Notes (Signed)
Virtual Visit via Phone   This visit type was conducted due to national recommendations for restrictions regarding the COVID-19 Pandemic (e.g. social distancing) in an effort to limit this patient's exposure and mitigate transmission in our community.  Due to her co-morbid illnesses, this patient is at least at moderate risk for complications without adequate follow up.  This format is felt to be most appropriate for this patient at this time.  All issues noted in this document were discussed and addressed.  A limited physical exam was performed with this format.    This visit type was conducted due to national recommendations for restrictions regarding the COVID-19 Pandemic (e.g. social distancing) in an effort to limit this patient's exposure and mitigate transmission in our community.  Patients identity confirmed using two different identifiers.  This format is felt to be most appropriate for this patient at this time.  All issues noted in this document were discussed and addressed.  No physical exam was performed (except for noted visual exam findings with Video Visits).    Date:  06/24/2018   ID:  Mariah Aguilar, DOB 07/16/1922, MRN [id number]  Patient Location:  Home - pt's identity confirmed with two different identifiers  Provider location:   Office    Chief Complaint:  "I think I have bronchitis"  History of Present Illness:    Mariah Aguilar Billing is a 85 y.o. female who presents via video conferencing for a telehealth visit today.    The patient does have symptoms concerning for COVID-19 infection (fever, chills, cough, or new shortness of breath).   She presents today for virtual visit. She prefers this method of contact due to COVID-19 pandemic.  She is accompanied by her granddaughter today. She is concerned about cough, wheezing and shortness of breath. She denies having a fever. She denies known exposure to COVID-19. She has been staying home. Her grandchildren do her  grocery shopping.     Past Medical History:  Diagnosis Date  . Arthritis   . Cardiomyopathy   . Cecal neoplasm   . CHF (congestive heart failure) ([place])   . Colon polyps   . Diverticulosis   . Hyperlipidemia   . Hypertension   . Insomnia   . Internal hemorrhoids   . Left bundle branch block (LBBB) on electrocardiogram    EKG of 5'12 shows 1st degree AV block/LBBB -Epic.   Past Surgical History:  Procedure Laterality Date  . ABDOMINAL HYSTERECTOMY    . COLON SURGERY    . COLONOSCOPY  05/27/2011   Procedure: COLONOSCOPY;  Surgeon: Juanita Craver, MD;  Location: [facility];  Service: Endoscopy;  Laterality: N/A;  . COLONOSCOPY WITH PROPOFOL N/A 06/30/2014   Procedure: COLONOSCOPY WITH PROPOFOL;  Surgeon: Juanita Craver, MD;  Location: [facility];  Service: Endoscopy;  Laterality: N/A;     Current Meds  Medication Sig  . amLODipine-valsartan (EXFORGE) 10-320 MG per tablet   . carvedilol (COREG) 12.5 MG tablet Take 12.5 mg by mouth 2 (two) times daily with a meal.   . DEXILANT 60 MG capsule TAKE 1 CAPSULE BY MOUTH EVERY DAY  . diazepam (VALIUM) 5 MG tablet Take 5 mg by mouth at bedtime as needed for sedation.   . digoxin (LANOXIN) 0.125 MG tablet Take 0.125 mg by mouth daily.   . furosemide (LASIX) 40 MG tablet Take 40 mg by mouth daily.   . Iron Polysacch Cmplx-B12-FA (NIFEREX-150 FORTE PO) Take 150 mg by mouth daily.   . potassium chloride (KLOR-CON)  8 MEQ CR tablet Take 8 mEq by mouth daily.    . Probiotic Product (RESTORA PO) Take 1 tablet by mouth daily.   . rosuvastatin (CRESTOR) 10 MG tablet Take 10 mg by mouth daily.    . traMADol (ULTRAM) 50 MG tablet Take 50 mg by mouth as needed for moderate pain.      Allergies:   Shrimp [shellfish allergy] and Penicillins   Social History   Tobacco Use  . Smoking status: Never Smoker  . Smokeless tobacco: Never Used  Substance Use Topics  . Alcohol use: No  . Drug use: No     Family Hx: The patient's family history includes  Cancer in her mother; Diabetes in her sister; Stroke in her father.  ROS:   Please see the history of present illness.    Review of Systems  Constitutional: Negative.   Respiratory: Positive for cough and shortness of breath.   Cardiovascular: Negative.   Gastrointestinal: Negative.   Neurological: Negative.   Psychiatric/Behavioral: Negative.     All other systems reviewed and are negative.   Labs/Other Tests and Data Reviewed:    Recent Labs: 05/19/2018: ALT 14; BUN 15; Creatinine, Ser 0.93; Hemoglobin 11.6; Platelets 675; Potassium 4.6; Sodium 140   Recent Lipid Panel Lab Results  Component Value Date/Time   CHOL 123 05/19/2018 12:45 PM   TRIG 89 05/19/2018 12:45 PM   HDL 45 05/19/2018 12:45 PM   CHOLHDL 2.7 05/19/2018 12:45 PM   LDLCALC 60 05/19/2018 12:45 PM    Wt Readings from Last 3 Encounters:  05/19/18 135 lb (61.2 kg)  03/06/18 126 lb (57.2 kg)  02/25/18 131 lb (59.4 kg)     Exam:    Vital Signs:  BP 130/67 (BP Location: Left Arm, Patient Position: Sitting, Cuff Size: Normal) Comment: pt provided  Temp 98.2 F (36.8 C) (Oral)   Ht 5\' 5"  (1.651 m)   BMI 22.47 kg/m     Physical Exam  Pulmonary/Chest: Effort normal.  Neurological: She is alert.    ASSESSMENT & PLAN:     1. Acute bronchitis, unspecified organism  She declines COVID testing. I offered to make nebulizer available to pick up and use as needed for cough/sob but she declined. A prescription for Tessalon perles was sent to the pharmacy. She is encouraged to go to ER should she develop a high fever, or if her sx worsen or she develops worsening SOB.   2. Thrombocytosis ([place])  Previous labs reviewed. She agrees to hematology referral for further evaluation. This has been an issue since early 2020.   - Ambulatory referral to Hematology    COVID-19 Education: The signs and symptoms of COVID-19 were discussed with the patient and how to seek care for testing (follow up with PCP or arrange  E-visit).  The importance of social distancing was discussed today.  Patient Risk:   After full review of this patients clinical status, I feel that they are at least moderate risk at this time.  Time:   Today, I have spent 13 minutes/ 10 seconds with the patient with telehealth technology discussing above diagnoses.     Medication Adjustments/Labs and Tests Ordered: Current medicines are reviewed at length with the patient today.  Concerns regarding medicines are outlined above.   Tests Ordered: Orders Placed This Encounter  Procedures  . Ambulatory referral to Hematology    Medication Changes: Meds ordered this encounter  Medications  . benzonatate (TESSALON PERLES) 100 MG capsule    Sig:  Take 1 capsule (100 mg total) by mouth 3 (three) times daily as needed for cough.    Dispense:  30 capsule    Refill:  1    Disposition:  Follow up prn  Signed, Maximino Greenland, MD

## 2018-06-25 ENCOUNTER — Other Ambulatory Visit: Payer: Self-pay | Admitting: Internal Medicine

## 2018-06-28 NOTE — Patient Instructions (Signed)
Acute Bronchitis, Adult Acute bronchitis is when air tubes (bronchi) in the lungs suddenly get swollen. The condition can make it hard to breathe. It can also cause these symptoms:  A cough.  Coughing up clear, yellow, or green mucus.  Wheezing.  Chest congestion.  Shortness of breath.  A fever.  Body aches.  Chills.  A sore throat. Follow these instructions at home:  Medicines  Take over-the-counter and prescription medicines only as told by your doctor.  If you were prescribed an antibiotic medicine, take it as told by your doctor. Do not stop taking the antibiotic even if you start to feel better. General instructions  Rest.  Drink enough fluids to keep your pee (urine) pale yellow.  Avoid smoking and secondhand smoke. If you smoke and you need help quitting, ask your doctor. Quitting will help your lungs heal faster.  Use an inhaler, cool mist vaporizer, or humidifier as told by your doctor.  Keep all follow-up visits as told by your doctor. This is important. How is this prevented? To lower your risk of getting this condition again:  Wash your hands often with soap and water. If you cannot use soap and water, use hand sanitizer.  Avoid contact with people who have cold symptoms.  Try not to touch your hands to your mouth, nose, or eyes.  Make sure to get the flu shot every year. Contact a doctor if:  Your symptoms do not get better in 2 weeks. Get help right away if:  You cough up blood.  You have chest pain.  You have very bad shortness of breath.  You become dehydrated.  You faint (pass out) or keep feeling like you are going to pass out.  You keep throwing up (vomiting).  You have a very bad headache.  Your fever or chills gets worse. This information is not intended to replace advice given to you by your health care provider. Make sure you discuss any questions you have with your health care provider. Document Released: 06/12/2007 Document  Revised: 08/07/2016 Document Reviewed: 06/14/2015 Elsevier Interactive Patient Education  2019 Elsevier Inc.  

## 2018-06-29 ENCOUNTER — Telehealth: Payer: Self-pay

## 2018-06-29 NOTE — Telephone Encounter (Signed)
I left the pt a message to call back. Dr. Baird Cancer wanted to know if the pt is feeling better and to see if the pt still has any wheezing or shortness of breath.

## 2018-06-30 ENCOUNTER — Encounter: Payer: Self-pay | Admitting: Internal Medicine

## 2018-06-30 ENCOUNTER — Telehealth: Payer: Self-pay | Admitting: Internal Medicine

## 2018-06-30 ENCOUNTER — Telehealth: Payer: Self-pay

## 2018-06-30 ENCOUNTER — Ambulatory Visit: Payer: Medicare Other | Admitting: Internal Medicine

## 2018-06-30 NOTE — Telephone Encounter (Signed)
The patient returned the call and said that she is feeling better, no wheezing she is much better and thank you.

## 2018-06-30 NOTE — Telephone Encounter (Signed)
A new hem appt has been scheduled for the pt to see Dr. Walden Field on 7/15 at 10:50am. Letter mailed to the pt.

## 2018-07-08 ENCOUNTER — Other Ambulatory Visit: Payer: Self-pay

## 2018-07-08 MED ORDER — ALBUTEROL SULFATE HFA 108 (90 BASE) MCG/ACT IN AERS: 2.0000 | INHALATION_SPRAY | Freq: Four times a day (QID) | RESPIRATORY_TRACT | 2 refills | Status: DC | PRN

## 2018-07-08 NOTE — Telephone Encounter (Signed)
The pt was notified that the office is out of samples of proair and that Dr. Baird Cancer sent a  Prescriptions.

## 2018-07-13 ENCOUNTER — Telehealth: Payer: Self-pay

## 2018-07-13 NOTE — Telephone Encounter (Signed)
I returned the pt's granddaughter [name]'s call and notified her that her grandmother needed to be evaluated in person so that she can have her oxygen level  Checked because the pt is having some wheezing and shortness of breath and for her to go to the urgent care, the office has no openings today and for the pt to also have coronavirus testing.

## 2018-07-22 ENCOUNTER — Inpatient Hospital Stay: Payer: Medicare Other

## 2018-07-22 ENCOUNTER — Inpatient Hospital Stay: Payer: Medicare Other | Attending: Internal Medicine | Admitting: Internal Medicine

## 2018-07-22 ENCOUNTER — Other Ambulatory Visit: Payer: Self-pay

## 2018-07-22 ENCOUNTER — Encounter: Payer: Self-pay | Admitting: Internal Medicine

## 2018-07-22 VITALS — BP 131/69 | HR 73 | Temp 98.2°F | Resp 18 | Ht 65.0 in | Wt 131.6 lb

## 2018-07-22 DIAGNOSIS — D508 Other iron deficiency anemias: Secondary | ICD-10-CM | POA: Diagnosis not present

## 2018-07-22 DIAGNOSIS — D539 Nutritional anemia, unspecified: Secondary | ICD-10-CM

## 2018-07-22 DIAGNOSIS — I129 Hypertensive chronic kidney disease with stage 1 through stage 4 chronic kidney disease, or unspecified chronic kidney disease: Secondary | ICD-10-CM | POA: Insufficient documentation

## 2018-07-22 DIAGNOSIS — D509 Iron deficiency anemia, unspecified: Secondary | ICD-10-CM | POA: Insufficient documentation

## 2018-07-22 DIAGNOSIS — D75839 Thrombocytosis, unspecified: Secondary | ICD-10-CM

## 2018-07-22 DIAGNOSIS — N189 Chronic kidney disease, unspecified: Secondary | ICD-10-CM | POA: Diagnosis not present

## 2018-07-22 DIAGNOSIS — D473 Essential (hemorrhagic) thrombocythemia: Secondary | ICD-10-CM | POA: Diagnosis not present

## 2018-07-22 DIAGNOSIS — E1122 Type 2 diabetes mellitus with diabetic chronic kidney disease: Secondary | ICD-10-CM | POA: Insufficient documentation

## 2018-07-22 LAB — IRON AND TIBC
Iron: 36 ug/dL — ABNORMAL LOW (ref 41–142)
Saturation Ratios: 8 % — ABNORMAL LOW (ref 21–57)
TIBC: 430 ug/dL (ref 236–444)
UIBC: 394 ug/dL — ABNORMAL HIGH (ref 120–384)

## 2018-07-22 LAB — CBC WITH DIFFERENTIAL (CANCER CENTER ONLY)
Abs Immature Granulocytes: 0.02 10*3/uL (ref 0.00–0.07)
Basophils Absolute: 0.1 10*3/uL (ref 0.0–0.1)
Basophils Relative: 1 %
Eosinophils Absolute: 0.4 10*3/uL (ref 0.0–0.5)
Eosinophils Relative: 5 %
HCT: 37.4 % (ref 36.0–46.0)
Hemoglobin: 11.7 g/dL — ABNORMAL LOW (ref 12.0–15.0)
Immature Granulocytes: 0 %
Lymphocytes Relative: 21 %
Lymphs Abs: 1.7 10*3/uL (ref 0.7–4.0)
MCH: 25.3 pg — ABNORMAL LOW (ref 26.0–34.0)
MCHC: 31.3 g/dL (ref 30.0–36.0)
MCV: 81 fL (ref 80.0–100.0)
Monocytes Absolute: 0.8 10*3/uL (ref 0.1–1.0)
Monocytes Relative: 10 %
Neutro Abs: 5.1 10*3/uL (ref 1.7–7.7)
Neutrophils Relative %: 63 %
Platelet Count: 753 10*3/uL — ABNORMAL HIGH (ref 150–400)
RBC: 4.62 MIL/uL (ref 3.87–5.11)
RDW: 22.8 % — ABNORMAL HIGH (ref 11.5–15.5)
WBC Count: 8.1 10*3/uL (ref 4.0–10.5)
nRBC: 0 % (ref 0.0–0.2)

## 2018-07-22 LAB — CMP (CANCER CENTER ONLY)
ALT: 18 U/L (ref 0–44)
AST: 19 U/L (ref 15–41)
Albumin: 4.4 g/dL (ref 3.5–5.0)
Alkaline Phosphatase: 63 U/L (ref 38–126)
Anion gap: 11 (ref 5–15)
BUN: 21 mg/dL (ref 8–23)
CO2: 28 mmol/L (ref 22–32)
Calcium: 9.1 mg/dL (ref 8.9–10.3)
Chloride: 102 mmol/L (ref 98–111)
Creatinine: 1 mg/dL (ref 0.44–1.00)
GFR, Est AFR Am: 56 mL/min — ABNORMAL LOW (ref >60)
GFR, Estimated: 48 mL/min — ABNORMAL LOW (ref >60)
Glucose, Bld: 119 mg/dL — ABNORMAL HIGH (ref 70–99)
Potassium: 4.4 mmol/L (ref 3.5–5.1)
Sodium: 141 mmol/L (ref 135–145)
Total Bilirubin: 0.5 mg/dL (ref 0.3–1.2)
Total Protein: 8.3 g/dL — ABNORMAL HIGH (ref 6.5–8.1)

## 2018-07-22 LAB — VITAMIN B12: Vitamin B-12: 475 pg/mL (ref 180–914)

## 2018-07-22 LAB — FOLATE: Folate: 15.8 ng/mL (ref >5.9)

## 2018-07-22 LAB — FERRITIN: Ferritin: 53 ng/mL (ref 11–307)

## 2018-07-22 LAB — LACTATE DEHYDROGENASE: LDH: 313 U/L — ABNORMAL HIGH (ref 98–192)

## 2018-07-22 LAB — SEDIMENTATION RATE: Sed Rate: 11 mm/hr (ref 0–22)

## 2018-07-22 NOTE — Progress Notes (Signed)
Referring Physician:  Glendale Chard, MD  Diagnosis Other iron deficiency anemia - Plan: CBC with Differential ([place] Only), CMP ([place] only), Lactate dehydrogenase (LDH), Ferritin, Iron and TIBC, Vitamin B12, Folate, Serum, Methylmalonic acid, serum, Rheumatoid factor, ANA, IFA (with reflex), Sedimentation rate, Hemoglobinopathy evaluation, BCR ABL1 FISH ([facility]), Jak 2 V617F ([facility]), Jak 2 Exon 12 ([facility])  Nutritional anemia - Plan: CBC with Differential ([place] Only), CMP ([place] only), Lactate dehydrogenase (LDH), Ferritin, Iron and TIBC, Vitamin B12, Folate, Serum, Methylmalonic acid, serum, Rheumatoid factor, ANA, IFA (with reflex), Sedimentation rate, Hemoglobinopathy evaluation  Thrombocytosis (HCC) - Plan: BCR ABL1 FISH ([facility]), Jak 2 V617F ([facility]), Jak 2 Exon 12 ([facility])  Staging Cancer Staging No matching staging information was found for the patient.  Assessment and Plan:  1.  Anemia.  85 year old female referred for evaluation due to anemia and elevated platelet count. Pt reports she started taking oral iron.  She denies any blood in stool or urine.  She denies craving ice.  She reports previous colonoscopy with Dr. Collene Mares. Review of chart showed colonoscopy was done 06/30/2014 and showed Poor prep with extensive pandiverticulosis and small internal hemorrhoids.     She has occasional joint discomfort.  Labs done 05/19/2018 reviewed and showed WBC 7.1 HB 11.6 plts 675,000.  Chemistries showed K+ 4.6 Cr 0.93 and normal LFTs.  Iron was 40.  Pt denies smoking.  Pt is seen today for consultation due to anemia and thrombocytosis.    Labs done today 07/22/2018 reviewed and showed WBC 8.1 HB 11.7 plts 753,000.  Chemistries showed K+ 4.4 Cr 1 and normal LFTs.  Ferritin 53.  B12 475.  Hb WNL at 11.7.  SPEP WNL in 05/2018.  Awaiting results of HB electrophoresis.    2.  Thrombocytosis.  Awaiting results of BCR/ABL and Jak 2. Plt are 753,000 on labs done today  07/22/2018.  Suspect due to prior to previous anemia or Myeloproliferative state.  Awaiting results of BCR/ABL and Jak 2.  Pt will have phone visit to go over results.    3.  Joint pain.  Awaiting results of sed rate, ANA and RF.    4.  Hypertension.  BP is 131/69.  Follow-up with PCP for monitoring.    5. CKD.  Cr 1.  Recent SPEP negative.  Follow-up with PCP for monitoring.    6.  Health maintenance.  Pt has undergone colonoscopy in 06/2014.  Follow-up with GI as recommended.   40 minutes spent with more than 50% spent in review of records, counseling and coordination of care.       Oncology History   No history exists.     HPI:  85 year old female referred for evaluation due to anemia and elevated platelet count. Pt reports she started taking oral iron.  She denies any blood in stool or urine.  She denies craving ice.  She reports previous colonoscopy with Dr. Collene Mares. Review of chart showed colonoscopy was done 06/30/2014 and showed Poor prep with extensive pandiverticulosis and small internal hemorrhoids.     She has occasional joint discomfort.  Labs done 05/19/2018 reviewed and showed WBC 7.1 HB 11.6 plts 675,000.  Chemistries showed K+ 4.6 Cr 0.93 and normal LFTs.  Iron was 40.  Pt denies smoking.  Pt is seen today for consultation due to anemia and thrombocytosis.    Problem List Patient Active Problem List   Diagnosis Date Noted  . Large bowel diverticular disease [K57.30] 03/06/2018  .  HLD (hyperlipidemia) [E78.5] 03/06/2018  . HTN (hypertension) [I10] 03/06/2018  . Rectal bleed [K62.5] 03/05/2018  . Type 2 diabetes mellitus with stage 2 chronic kidney disease, without long-term current use of insulin ([place]) [Z61.09, N18.2] 12/24/2017  . Chronic renal disease, stage II [N18.2] 12/24/2017  . 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 Estrogen deficiency [E28.39] 12/24/2017  . Fracture of superior pubic ramus ([place]) [S32.519A] 05/08/2012  . Inferior pubic ramus fracture  ([place]) [S32.599A] 05/08/2012  . Osteoarthritis of right knee [M17.11] 08/19/2011  . Scoliosis of lumbar spine [M41.9] 05/21/2011  . Scoliosis of thoracic spine [M41.9] 05/21/2011    Past Medical History Past Medical History:  Diagnosis Date  . Arthritis   . Cardiomyopathy   . Cecal neoplasm   . CHF (congestive heart failure) ([place])   . Colon polyps   . Diverticulosis   . Hyperlipidemia   . Hypertension   . Insomnia   . Internal hemorrhoids   . Left bundle branch block (LBBB) on electrocardiogram    EKG of 5'12 shows 1st degree AV block/LBBB -Epic.    Past Surgical History Past Surgical History:  Procedure Laterality Date  . ABDOMINAL HYSTERECTOMY    . COLON SURGERY    . COLONOSCOPY  05/27/2011   Procedure: COLONOSCOPY;  Surgeon: Juanita Craver, MD;  Location: [facility];  Service: Endoscopy;  Laterality: N/A;  . COLONOSCOPY WITH PROPOFOL N/A 06/30/2014   Procedure: COLONOSCOPY WITH PROPOFOL;  Surgeon: Juanita Craver, MD;  Location: [facility];  Service: Endoscopy;  Laterality: N/A;    Family History Family History  Problem Relation Age of Onset  . Cancer Mother        COLON  . Stroke Father   . Diabetes Sister      Social History  reports that she has never smoked. She has never used smokeless tobacco. She reports that she does not drink alcohol or use drugs.  Medications  Current Outpatient Medications:  .  albuterol (VENTOLIN HFA) 108 (90 Base) MCG/ACT inhaler, Inhale 2 puffs into the lungs every 6 (six) hours as needed for wheezing or shortness of breath., Disp: 6.7 g, Rfl: 2 .  amLODipine-valsartan (EXFORGE) 10-320 MG per tablet, , Disp: , Rfl:  .  benzonatate (TESSALON PERLES) 100 MG capsule, Take 1 capsule (100 mg total) by mouth 3 (three) times daily as needed for cough., Disp: 30 capsule, Rfl: 1 .  carvedilol (COREG) 12.5 MG tablet, Take 12.5 mg by mouth 2 (two) times daily with a meal. , Disp: , Rfl: 3 .  DEXILANT 60 MG capsule, TAKE 1 CAPSULE BY MOUTH EVERY  DAY, Disp: 30 capsule, Rfl: 1 .  diazepam (VALIUM) 5 MG tablet, Take 5 mg by mouth at bedtime as needed for sedation. , Disp: , Rfl: 1 .  digoxin (LANOXIN) 0.125 MG tablet, Take 0.125 mg by mouth daily. , Disp: , Rfl: 3 .  furosemide (LASIX) 40 MG tablet, Take 40 mg by mouth daily. , Disp: , Rfl:  .  Iron Polysacch Cmplx-B12-FA (NIFEREX-150 FORTE PO), Take 150 mg by mouth daily. , Disp: , Rfl:  .  potassium chloride (KLOR-CON) 8 MEQ CR tablet, Take 8 mEq by mouth daily.  , Disp: , Rfl:  .  Probiotic Product (RESTORA PO), Take 1 tablet by mouth daily. , Disp: , Rfl:  .  rosuvastatin (CRESTOR) 10 MG tablet, Take 10 mg by mouth daily.  , Disp: , Rfl:  .  traMADol (ULTRAM) 50 MG tablet, Take 50 mg by mouth as needed  for moderate pain. , Disp: , Rfl: 0  Allergies Shrimp [shellfish allergy] and Penicillins  Review of Systems Review of Systems - Oncology ROS negative   Physical Exam  Vitals Wt Readings from Last 3 Encounters:  07/22/18 131 lb 9.6 oz (59.7 kg)  05/19/18 135 lb (61.2 kg)  03/06/18 126 lb (57.2 kg)   Temp Readings from Last 3 Encounters:  07/22/18 98.2 F (36.8 C) (Temporal)  06/24/18 98.2 F (36.8 C) (Oral)  05/19/18 98.2 F (36.8 C) (Oral)   BP Readings from Last 3 Encounters:  07/22/18 131/69  06/24/18 130/67  05/19/18 128/76   Pulse Readings from Last 3 Encounters:  07/22/18 73  05/19/18 62  03/06/18 66   Constitutional: Elderly female seated in wheelchair  and in no distress.   HENT: Head: Normocephalic and atraumatic.  Mouth/Throat: No oropharyngeal exudate. Mucosa moist. Eyes: Pupils are equal, round, and reactive to light. Conjunctivae are normal. No scleral icterus.  Neck: Normal range of motion. Neck supple. No JVD present.  Cardiovascular: Normal rate, regular rhythm and normal heart sounds.  Exam reveals no gallop and no friction rub.   No murmur heard. Pulmonary/Chest: Effort normal and breath sounds normal. No respiratory distress. No wheezes.No  rales.  Abdominal: Soft. Bowel sounds are normal. No distension. There is no tenderness. There is no guarding.  Musculoskeletal: No edema or tenderness.  Lymphadenopathy: No cervical,axillary or supraclavicular adenopathy.  Neurological: Alert and oriented to person, place, and time. No cranial nerve deficit.  Skin: Skin is warm and dry. No rash noted. No erythema. No pallor.  Psychiatric: Affect and judgment normal.   Labs Appointment on 07/22/2018  Component Date Value Ref Range Status  . Sed Rate 07/22/2018 11  0 - 22 mm/hr Final   Performed at [facility], [place] [address] [place], [place] [address]  . Folate 07/22/2018 15.8  >5.9 ng/mL Final   Performed at [place] [address] [place], [place] [address]  . Vitamin B-12 07/22/2018 475  180 - 914 pg/mL Final   Comment: (NOTE) This assay is not validated for testing neonatal or myeloproliferative syndrome specimens for Vitamin B12 levels. Performed at [facility], [place] [address], [place], [place] [address]   . Iron 07/22/2018 36* 41 - 142 ug/dL Final  . TIBC 07/22/2018 430  236 - 444 ug/dL Final  . Saturation Ratios 07/22/2018 8* 21 - 57 % Final  . UIBC 07/22/2018 394* 120 - 384 ug/dL Final   Performed at [facility], [place] [address], [place], [place] [address]  . Ferritin 07/22/2018 53  11 - 307 ng/mL Final   Performed at [facility], [place] [address], [place], [place] [address]  . LDH 07/22/2018 313* 98 - 192 U/L Final   Performed at [facility], [place] [address], [place], [place] [address]  . Sodium 07/22/2018 141  135 - 145 mmol/L Final  . Potassium 07/22/2018 4.4  3.5 - 5.1 mmol/L Final  . Chloride 07/22/2018 102  98 - 111 mmol/L Final  . CO2 07/22/2018 28  22 - 32 mmol/L Final  . Glucose, Bld 07/22/2018 119* 70 - 99 mg/dL Final  . BUN 07/22/2018 21  8 - 23 mg/dL Final  . Creatinine  07/22/2018 1.00  0.44 - 1.00 mg/dL Final  . Calcium 07/22/2018 9.1  8.9 - 10.3 mg/dL Final  . Total Protein 07/22/2018 8.3* 6.5 - 8.1 g/dL Final  . Albumin  07/22/2018 4.4  3.5 - 5.0 g/dL Final  . AST 07/22/2018 19  15 - 41 U/L Final  . ALT 07/22/2018 18  0 - 44 U/L Final  . Alkaline Phosphatase 07/22/2018 63  38 - 126 U/L Final  . Total Bilirubin 07/22/2018 0.5  0.3 - 1.2 mg/dL Final  . GFR, Est Non Af Am 07/22/2018 48* >60 mL/min Final  . GFR, Est AFR Am 07/22/2018 56* >60 mL/min Final  . Anion gap 07/22/2018 11  5 - 15 Final   Performed at [facility], [place] [address], [place], [place] [address]  . WBC Count 07/22/2018 8.1  4.0 - 10.5 K/uL Final  . RBC 07/22/2018 4.62  3.87 - 5.11 MIL/uL Final  . Hemoglobin 07/22/2018 11.7* 12.0 - 15.0 g/dL Final  . HCT 07/22/2018 37.4  36.0 - 46.0 % Final  . MCV 07/22/2018 81.0  80.0 - 100.0 fL Final  . MCH 07/22/2018 25.3* 26.0 - 34.0 pg Final  . MCHC 07/22/2018 31.3  30.0 - 36.0 g/dL Final  . RDW 07/22/2018 22.8* 11.5 - 15.5 % Final  . Platelet Count 07/22/2018 753* 150 - 400 K/uL Final  . nRBC 07/22/2018 0.0  0.0 - 0.2 % Final  . Neutrophils Relative % 07/22/2018 63  % Final  . Neutro Abs 07/22/2018 5.1  1.7 - 7.7 K/uL Final  . Lymphocytes Relative 07/22/2018 21  % Final  . Lymphs Abs 07/22/2018 1.7  0.7 - 4.0 K/uL Final  . Monocytes Relative 07/22/2018 10  % Final  . Monocytes Absolute 07/22/2018 0.8  0.1 - 1.0 K/uL Final  . Eosinophils Relative 07/22/2018 5  % Final  . Eosinophils Absolute 07/22/2018 0.4  0.0 - 0.5 K/uL Final  . Basophils Relative 07/22/2018 1  % Final  . Basophils Absolute 07/22/2018 0.1  0.0 - 0.1 K/uL Final  . Immature Granulocytes 07/22/2018 0  % Final  . Abs Immature Granulocytes 07/22/2018 0.02  0.00 - 0.07 K/uL Final   Performed at [facility], [place] [address], [place], [place] [address]     Pathology Orders Placed This Encounter  Procedures  . CBC with  Differential ([facility] Only)    Standing Status:   Future    Number of Occurrences:   1    Standing Expiration Date:   07/22/2019  . CMP ([place] only)    Standing Status:   Future    Number of Occurrences:   1    Standing Expiration Date:   07/22/2019  . Lactate dehydrogenase (LDH)    Standing Status:   Future    Number of Occurrences:   1    Standing Expiration Date:   07/22/2019  . Ferritin    Standing Status:   Future    Number of Occurrences:   1    Standing Expiration Date:   07/22/2019  . Iron and TIBC    Standing Status:   Future    Number of Occurrences:   1    Standing Expiration Date:   07/22/2019  . Vitamin B12    Standing Status:   Future    Number of Occurrences:   1    Standing Expiration Date:   07/22/2019  . Folate, Serum    Standing Status:   Future    Number of Occurrences:   1    Standing Expiration Date:   07/22/2019  . Methylmalonic acid, serum    Standing Status:   Future  Number of Occurrences:   1    Standing Expiration Date:   07/22/2019  . Rheumatoid factor    Standing Status:   Future    Number of Occurrences:   1    Standing Expiration Date:   07/22/2019  . ANA, IFA (with reflex)    Standing Status:   Future    Number of Occurrences:   1    Standing Expiration Date:   07/22/2019  . Sedimentation rate    Standing Status:   Future    Number of Occurrences:   1    Standing Expiration Date:   07/22/2019  . Hemoglobinopathy evaluation    Standing Status:   Future    Number of Occurrences:   1    Standing Expiration Date:   07/22/2019  . BCR ABL1 FISH ([facility])    Add to blood in lab if possible.  Spoke with lab    Standing Status:   Future    Number of Occurrences:   1    Standing Expiration Date:   07/22/2019  . Jak 2 V617F ([facility])    Add to blood in lab if possible.  Spoke with lab    Standing Status:   Future    Number of Occurrences:   1    Standing Expiration Date:   07/22/2019  . Jak 2 Exon 12 ([facility])    Add to blood in lab  if possible.  Spoke with lab    Standing Status:   Future    Number of Occurrences:   1    Standing Expiration Date:   07/22/2019       Zoila Shutter MD

## 2018-07-23 LAB — RHEUMATOID FACTOR: Rheumatoid fact SerPl-aCnc: 30.3 IU/mL — ABNORMAL HIGH (ref 0.0–13.9)

## 2018-07-23 LAB — ANTINUCLEAR ANTIBODIES, IFA: ANA Ab, IFA: NEGATIVE

## 2018-07-24 ENCOUNTER — Other Ambulatory Visit: Payer: Self-pay | Admitting: Internal Medicine

## 2018-07-24 ENCOUNTER — Telehealth: Payer: Self-pay | Admitting: Internal Medicine

## 2018-07-24 LAB — HEMOGLOBINOPATHY EVALUATION
Hgb A2 Quant: 1.9 % (ref 1.8–3.2)
Hgb A: 98.1 % (ref 96.4–98.8)
Hgb C: 0 %
Hgb F Quant: 0 % (ref 0.0–2.0)
Hgb S Quant: 0 %
Hgb Variant: 0 %

## 2018-07-24 NOTE — Telephone Encounter (Signed)
Called and left msg for upcoming appt

## 2018-07-26 LAB — METHYLMALONIC ACID, SERUM: Methylmalonic Acid, Quantitative: 290 nmol/L (ref 0–378)

## 2018-07-27 DIAGNOSIS — H353113 Nonexudative age-related macular degeneration, right eye, advanced atrophic without subfoveal involvement: Secondary | ICD-10-CM | POA: Diagnosis not present

## 2018-07-27 DIAGNOSIS — H353211 Exudative age-related macular degeneration, right eye, with active choroidal neovascularization: Secondary | ICD-10-CM | POA: Diagnosis not present

## 2018-07-27 DIAGNOSIS — H353123 Nonexudative age-related macular degeneration, left eye, advanced atrophic without subfoveal involvement: Secondary | ICD-10-CM | POA: Diagnosis not present

## 2018-07-27 DIAGNOSIS — H353221 Exudative age-related macular degeneration, left eye, with active choroidal neovascularization: Secondary | ICD-10-CM | POA: Diagnosis not present

## 2018-07-28 ENCOUNTER — Other Ambulatory Visit: Payer: Self-pay

## 2018-07-28 ENCOUNTER — Emergency Department (HOSPITAL_COMMUNITY): Payer: Medicare Other

## 2018-07-28 ENCOUNTER — Inpatient Hospital Stay (HOSPITAL_COMMUNITY)
Admission: EM | Admit: 2018-07-28 | Discharge: 2018-07-31 | DRG: 150 | Disposition: A | Discharge status: Home / Self Care | Payer: Medicare Other | Attending: Internal Medicine | Admitting: Internal Medicine

## 2018-07-28 ENCOUNTER — Encounter (HOSPITAL_COMMUNITY): Payer: Self-pay

## 2018-07-28 DIAGNOSIS — I517 Cardiomegaly: Secondary | ICD-10-CM | POA: Diagnosis not present

## 2018-07-28 DIAGNOSIS — J811 Chronic pulmonary edema: Secondary | ICD-10-CM | POA: Diagnosis not present

## 2018-07-28 DIAGNOSIS — N182 Chronic kidney disease, stage 2 (mild): Secondary | ICD-10-CM | POA: Diagnosis present

## 2018-07-28 DIAGNOSIS — Z91013 Allergy to seafood: Secondary | ICD-10-CM

## 2018-07-28 DIAGNOSIS — E1122 Type 2 diabetes mellitus with diabetic chronic kidney disease: Secondary | ICD-10-CM | POA: Diagnosis present

## 2018-07-28 DIAGNOSIS — I428 Other cardiomyopathies: Secondary | ICD-10-CM | POA: Diagnosis present

## 2018-07-28 DIAGNOSIS — K219 Gastro-esophageal reflux disease without esophagitis: Secondary | ICD-10-CM | POA: Diagnosis not present

## 2018-07-28 DIAGNOSIS — Z90710 Acquired absence of both cervix and uterus: Secondary | ICD-10-CM

## 2018-07-28 DIAGNOSIS — Z20828 Contact with and (suspected) exposure to other viral communicable diseases: Secondary | ICD-10-CM | POA: Diagnosis not present

## 2018-07-28 DIAGNOSIS — R0902 Hypoxemia: Secondary | ICD-10-CM | POA: Diagnosis present

## 2018-07-28 DIAGNOSIS — I5033 Acute on chronic diastolic (congestive) heart failure: Secondary | ICD-10-CM | POA: Diagnosis present

## 2018-07-28 DIAGNOSIS — I130 Hypertensive heart and chronic kidney disease with heart failure and stage 1 through stage 4 chronic kidney disease, or unspecified chronic kidney disease: Secondary | ICD-10-CM | POA: Diagnosis present

## 2018-07-28 DIAGNOSIS — R040 Epistaxis: Secondary | ICD-10-CM | POA: Diagnosis not present

## 2018-07-28 DIAGNOSIS — G4700 Insomnia, unspecified: Secondary | ICD-10-CM | POA: Diagnosis present

## 2018-07-28 DIAGNOSIS — E785 Hyperlipidemia, unspecified: Secondary | ICD-10-CM | POA: Diagnosis not present

## 2018-07-28 DIAGNOSIS — Z79899 Other long term (current) drug therapy: Secondary | ICD-10-CM

## 2018-07-28 DIAGNOSIS — I447 Left bundle-branch block, unspecified: Secondary | ICD-10-CM | POA: Diagnosis present

## 2018-07-28 DIAGNOSIS — I5043 Acute on chronic combined systolic (congestive) and diastolic (congestive) heart failure: Secondary | ICD-10-CM | POA: Diagnosis not present

## 2018-07-28 DIAGNOSIS — I10 Essential (primary) hypertension: Secondary | ICD-10-CM | POA: Diagnosis present

## 2018-07-28 DIAGNOSIS — D62 Acute posthemorrhagic anemia: Secondary | ICD-10-CM | POA: Diagnosis not present

## 2018-07-28 DIAGNOSIS — Z880 Allergy status to penicillin: Secondary | ICD-10-CM

## 2018-07-28 LAB — CBC WITH DIFFERENTIAL/PLATELET
Abs Immature Granulocytes: 0.03 10*3/uL (ref 0.00–0.07)
Basophils Absolute: 0.1 10*3/uL (ref 0.0–0.1)
Basophils Relative: 1 %
Eosinophils Absolute: 0.2 10*3/uL (ref 0.0–0.5)
Eosinophils Relative: 2 %
HCT: 36.2 % (ref 36.0–46.0)
Hemoglobin: 10.9 g/dL — ABNORMAL LOW (ref 12.0–15.0)
Immature Granulocytes: 0 %
Lymphocytes Relative: 14 %
Lymphs Abs: 1.4 10*3/uL (ref 0.7–4.0)
MCH: 26.1 pg (ref 26.0–34.0)
MCHC: 30.1 g/dL (ref 30.0–36.0)
MCV: 86.6 fL (ref 80.0–100.0)
Monocytes Absolute: 0.6 10*3/uL (ref 0.1–1.0)
Monocytes Relative: 6 %
Neutro Abs: 7.2 10*3/uL (ref 1.7–7.7)
Neutrophils Relative %: 77 %
Platelets: 712 10*3/uL — ABNORMAL HIGH (ref 150–400)
RBC: 4.18 MIL/uL (ref 3.87–5.11)
RDW: 23.7 % — ABNORMAL HIGH (ref 11.5–15.5)
WBC: 9.4 10*3/uL (ref 4.0–10.5)
nRBC: 0 % (ref 0.0–0.2)

## 2018-07-28 LAB — BASIC METABOLIC PANEL
Anion gap: 10 (ref 5–15)
BUN: 25 mg/dL — ABNORMAL HIGH (ref 8–23)
CO2: 27 mmol/L (ref 22–32)
Calcium: 8.1 mg/dL — ABNORMAL LOW (ref 8.9–10.3)
Chloride: 103 mmol/L (ref 98–111)
Creatinine, Ser: 0.87 mg/dL (ref 0.44–1.00)
GFR calc Af Amer: >60 mL/min (ref >60)
GFR calc non Af Amer: 57 mL/min — ABNORMAL LOW (ref >60)
Glucose, Bld: 129 mg/dL — ABNORMAL HIGH (ref 70–99)
Potassium: 4.3 mmol/L (ref 3.5–5.1)
Sodium: 140 mmol/L (ref 135–145)

## 2018-07-28 LAB — GLUCOSE, CAPILLARY: Glucose-Capillary: 150 mg/dL — ABNORMAL HIGH (ref 70–99)

## 2018-07-28 LAB — DIGOXIN LEVEL: Digoxin Level: 0.4 ng/mL — ABNORMAL LOW (ref 0.8–2.0)

## 2018-07-28 LAB — BRAIN NATRIURETIC PEPTIDE: B Natriuretic Peptide: 923 pg/mL — ABNORMAL HIGH (ref 0.0–100.0)

## 2018-07-28 LAB — SARS CORONAVIRUS 2 BY RT PCR (HOSPITAL ORDER, PERFORMED IN ~~LOC~~ HOSPITAL LAB): SARS Coronavirus 2: NEGATIVE

## 2018-07-28 LAB — ABO/RH: ABO/RH(D): O POS

## 2018-07-28 MED ORDER — INSULIN ASPART 100 UNIT/ML ~~LOC~~ SOLN
0.0000 [IU] | Freq: Every day | SUBCUTANEOUS | Status: DC
  Filled 2018-07-28: qty 0.05

## 2018-07-28 MED ORDER — AMLODIPINE BESYLATE-VALSARTAN 10-320 MG PO TABS: 1.0000 | ORAL_TABLET | Freq: Every day | ORAL | Status: DC

## 2018-07-28 MED ORDER — ONDANSETRON HCL 4 MG/2ML IJ SOLN: 4.0000 mg | Freq: Four times a day (QID) | INTRAMUSCULAR | Status: DC | PRN

## 2018-07-28 MED ORDER — ENOXAPARIN SODIUM 40 MG/0.4ML ~~LOC~~ SOLN
40.0000 mg | SUBCUTANEOUS | Status: DC
  Administered 2018-07-29 – 2018-07-30 (×2): 40 mg via SUBCUTANEOUS
  Filled 2018-07-28 (×2): qty 0.4

## 2018-07-28 MED ORDER — SODIUM CHLORIDE 0.9% FLUSH
3.0000 mL | Freq: Two times a day (BID) | INTRAVENOUS | Status: DC
  Administered 2018-07-28 – 2018-07-31 (×6): 3 mL via INTRAVENOUS

## 2018-07-28 MED ORDER — NITROGLYCERIN 2 % TD OINT
1.0000 [in_us] | TOPICAL_OINTMENT | Freq: Once | TRANSDERMAL | Status: AC
  Administered 2018-07-28: 15:00:00 1 [in_us] via TOPICAL
  Filled 2018-07-28: qty 1

## 2018-07-28 MED ORDER — INSULIN ASPART 100 UNIT/ML ~~LOC~~ SOLN
0.0000 [IU] | Freq: Three times a day (TID) | SUBCUTANEOUS | Status: DC
  Administered 2018-07-30 – 2018-07-31 (×2): 3 [IU] via SUBCUTANEOUS
  Filled 2018-07-28: qty 0.15

## 2018-07-28 MED ORDER — TRAMADOL HCL 50 MG PO TABS: 50.0000 mg | ORAL_TABLET | ORAL | Status: DC | PRN

## 2018-07-28 MED ORDER — CARVEDILOL 12.5 MG PO TABS
12.5000 mg | ORAL_TABLET | Freq: Two times a day (BID) | ORAL | Status: DC
  Administered 2018-07-28 – 2018-07-31 (×6): 12.5 mg via ORAL
  Filled 2018-07-28 (×6): qty 1

## 2018-07-28 MED ORDER — FUROSEMIDE 10 MG/ML IJ SOLN
40.0000 mg | Freq: Two times a day (BID) | INTRAMUSCULAR | Status: DC
  Administered 2018-07-29 – 2018-07-31 (×5): 40 mg via INTRAVENOUS
  Filled 2018-07-28 (×5): qty 4

## 2018-07-28 MED ORDER — FUROSEMIDE 10 MG/ML IJ SOLN
40.0000 mg | Freq: Once | INTRAMUSCULAR | Status: AC
  Administered 2018-07-28: 15:00:00 40 mg via INTRAVENOUS
  Filled 2018-07-28: qty 4

## 2018-07-28 MED ORDER — ALBUTEROL SULFATE HFA 108 (90 BASE) MCG/ACT IN AERS
2.0000 | INHALATION_SPRAY | Freq: Four times a day (QID) | RESPIRATORY_TRACT | Status: DC | PRN
  Administered 2018-07-28: 18:00:00 2 via RESPIRATORY_TRACT
  Filled 2018-07-28: qty 6.7

## 2018-07-28 MED ORDER — IRBESARTAN 300 MG PO TABS
300.0000 mg | ORAL_TABLET | Freq: Every day | ORAL | Status: DC
  Administered 2018-07-29 – 2018-07-31 (×3): 300 mg via ORAL
  Filled 2018-07-28 (×3): qty 1

## 2018-07-28 MED ORDER — OXYMETAZOLINE HCL 0.05 % NA SOLN
2.0000 | Freq: Two times a day (BID) | NASAL | Status: DC | PRN
  Administered 2018-07-28: 2 via NASAL
  Filled 2018-07-28: qty 15

## 2018-07-28 MED ORDER — OXYMETAZOLINE HCL 0.05 % NA SOLN
1.0000 | Freq: Once | NASAL | Status: AC
  Administered 2018-07-28: 11:00:00 1 via NASAL
  Filled 2018-07-28: qty 30

## 2018-07-28 MED ORDER — TRAMADOL HCL 50 MG PO TABS: 50.0000 mg | ORAL_TABLET | Freq: Four times a day (QID) | ORAL | Status: DC | PRN

## 2018-07-28 MED ORDER — ALBUTEROL SULFATE (2.5 MG/3ML) 0.083% IN NEBU: 2.5000 mg | INHALATION_SOLUTION | Freq: Four times a day (QID) | RESPIRATORY_TRACT | Status: DC | PRN

## 2018-07-28 MED ORDER — PANTOPRAZOLE SODIUM 40 MG PO TBEC
40.0000 mg | DELAYED_RELEASE_TABLET | Freq: Every day | ORAL | Status: DC
  Administered 2018-07-29 – 2018-07-31 (×3): 40 mg via ORAL
  Filled 2018-07-28 (×3): qty 1

## 2018-07-28 MED ORDER — AMLODIPINE BESYLATE 10 MG PO TABS
10.0000 mg | ORAL_TABLET | Freq: Every day | ORAL | Status: DC
  Administered 2018-07-29 – 2018-07-31 (×3): 10 mg via ORAL
  Filled 2018-07-28 (×3): qty 1

## 2018-07-28 MED ORDER — ACETAMINOPHEN 325 MG PO TABS: 650.0000 mg | ORAL_TABLET | ORAL | Status: DC | PRN

## 2018-07-28 MED ORDER — SODIUM CHLORIDE 0.9% FLUSH: 3.0000 mL | INTRAVENOUS | Status: DC | PRN

## 2018-07-28 MED ORDER — ROSUVASTATIN CALCIUM 10 MG PO TABS
10.0000 mg | ORAL_TABLET | Freq: Every day | ORAL | Status: DC
  Administered 2018-07-29 – 2018-07-31 (×3): 10 mg via ORAL
  Filled 2018-07-28 (×3): qty 1

## 2018-07-28 MED ORDER — DIGOXIN 125 MCG PO TABS
0.1250 mg | ORAL_TABLET | Freq: Every day | ORAL | Status: DC
  Administered 2018-07-29 – 2018-07-31 (×3): 0.125 mg via ORAL
  Filled 2018-07-28 (×3): qty 1

## 2018-07-28 MED ORDER — ALBUTEROL SULFATE HFA 108 (90 BASE) MCG/ACT IN AERS
2.0000 | INHALATION_SPRAY | Freq: Once | RESPIRATORY_TRACT | Status: AC
  Administered 2018-07-28: 2 via RESPIRATORY_TRACT
  Filled 2018-07-28: qty 6.7

## 2018-07-28 MED ORDER — SODIUM CHLORIDE 0.9 % IV SOLN: 250.0000 mL | INTRAVENOUS | Status: DC | PRN

## 2018-07-28 MED ORDER — DIAZEPAM 5 MG PO TABS
2.5000 mg | ORAL_TABLET | Freq: Every evening | ORAL | Status: DC | PRN
  Administered 2018-07-29 – 2018-07-30 (×2): 2.5 mg via ORAL
  Filled 2018-07-28 (×3): qty 1

## 2018-07-28 NOTE — ED Provider Notes (Signed)
[facility] DEPT Provider Note   CSN: [id number] Arrival date & time: 07/28/18  1017    History   Chief Complaint Chief Complaint  Patient presents with  . Epistaxis    HPI Mariah Aguilar is a 85 y.o. female with history of iron deficiency anemia, thrombocytopenia non-insulin-dependent diabetes, CKD, HTN, HLD, diverticular disease presents to the ER for evaluation of nosebleed.  Sudden onset at 6 AM today.  She called paramedics and was instructed to use Afrin which stopped the bleeding for a little while but this returned a couple hours later.  Reports heavy bleeding with some clots out of the right nose.  Some blood dripped out and went into her mouth.  Had a bad nosebleed several years ago.  No intranasal trauma.  No anticoagulants.  States that a few days ago she had some headaches and took 2 Tylenol and thinks that maybe this made her bleed easier.  She takes 81 mg aspirin every day.  Bleeding has stopped upon arrival to the ER.  Patient noted to be at 90% SPO2 at rest on room air during conversation.  RN at bedside states that patient ambulated to the bathroom and complained of dizziness, when she returned SPO2 checked and she was at 80%.  Placed on 2 L [place] and now increased to 92%.  Patient denies using her oxygen at home.  States that for the last month she has had intermittent wheezing when she takes deep breaths and worse at night when she lays down.  She told her PCP about this and was told that she had bronchitis but the wheezing has persisted.  She exercises on her stationary bike 5 times a week and states she has been feeling well.  She denies any fevers, chills, cough, chest pain but does report intermittent shortness of breath "only sometimes" and lightheadedness.  No alleviating factors.  No other interventions.     HPI  Past Medical History:  Diagnosis Date  . Arthritis   . Cardiomyopathy   . Cecal neoplasm   . CHF (congestive heart  failure) ([place])   . Colon polyps   . Diverticulosis   . Hyperlipidemia   . Hypertension   . Insomnia   . Internal hemorrhoids   . Left bundle branch block (LBBB) on electrocardiogram    EKG of 5'12 shows 1st degree AV block/LBBB -Epic.    Patient Active Problem List   Diagnosis Date Noted  . Large bowel diverticular disease 03/06/2018  . HLD (hyperlipidemia) 03/06/2018  . HTN (hypertension) 03/06/2018  . Rectal bleed 03/05/2018  . Type 2 diabetes mellitus with stage 2 chronic kidney disease, without long-term current use of insulin ([place]) 12/24/2017  . Chronic renal disease, stage II 12/24/2017  . 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 Estrogen deficiency 12/24/2017  . Fracture of superior pubic ramus ([place]) 05/08/2012  . Inferior pubic ramus fracture ([place]) 05/08/2012  . Osteoarthritis of right knee 08/19/2011  . Scoliosis of lumbar spine 05/21/2011  . Scoliosis of thoracic spine 05/21/2011    Past Surgical History:  Procedure Laterality Date  . ABDOMINAL HYSTERECTOMY    . COLON SURGERY    . COLONOSCOPY  05/27/2011   Procedure: COLONOSCOPY;  Surgeon: Juanita Craver, MD;  Location: [facility] ENDOSCOPY;  Service: Endoscopy;  Laterality: N/A;  . COLONOSCOPY WITH PROPOFOL N/A 06/30/2014   Procedure: COLONOSCOPY WITH PROPOFOL;  Surgeon: Juanita Craver, MD;  Location: [facility] ENDOSCOPY;  Service: Endoscopy;  Laterality: N/A;     OB History  Gravida  1   Para      Term      Preterm      AB      Living        SAB      TAB      Ectopic      Multiple      Live Births               Home Medications    Prior to Admission medications   Medication Sig Start Date End Date Taking? Authorizing Provider  amLODipine-valsartan (EXFORGE) 10-320 MG per tablet Take 1 tablet by mouth daily.  12/20/13  Yes [provider]  carvedilol (COREG) 12.5 MG tablet Take 12.5 mg by mouth 2 (two) times daily with a meal.  11/10/13  Yes [provider]  DEXILANT 60 MG capsule  TAKE 1 [place] Patient taking differently: Take 60 mg by mouth daily.  07/28/18  Yes Glendale Chard, MD  diazepam (VALIUM) 5 MG tablet Take 2.5 mg by mouth at bedtime as needed for sedation.  12/11/13  Yes [provider]  digoxin (LANOXIN) 0.125 MG tablet Take 0.125 mg by mouth daily.  10/07/13  Yes [provider]  furosemide (LASIX) 40 MG tablet Take 40 mg by mouth daily.    Yes [provider]  Iron Polysacch Cmplx-B12-FA (NIFEREX-150 FORTE PO) Take 150 mg by mouth daily.    Yes [provider]  oxymetazoline (AFRIN) 0.05 % nasal spray Place 1 spray into both nostrils 2 (two) times daily as needed (nose bleeds).   Yes [provider]  potassium chloride (KLOR-CON) 8 MEQ CR tablet Take 8 mEq by mouth daily.     Yes [provider]  Probiotic Product (RESTORA PO) Take 1 tablet by mouth daily.    Yes [provider]  rosuvastatin (CRESTOR) 10 MG tablet Take 10 mg by mouth daily.     Yes [provider]  traMADol (ULTRAM) 50 MG tablet Take 50 mg by mouth as needed for moderate pain.  10/13/17  Yes [provider]  albuterol (VENTOLIN HFA) 108 (90 Base) MCG/ACT inhaler Inhale 2 puffs into the lungs every 6 (six) hours as needed for wheezing or shortness of breath. 07/08/18   Glendale Chard, MD  benzonatate (TESSALON PERLES) 100 MG capsule Take 1 capsule (100 mg total) by mouth 3 (three) times daily as needed for cough. 06/24/18 06/24/19  Glendale Chard, MD    Family History Family History  Problem Relation Age of Onset  . Cancer Mother        COLON  . Stroke Father   . Diabetes Sister     Social History Social History   Tobacco Use  . Smoking status: Never Smoker  . Smokeless tobacco: Never Used  Substance Use Topics  . Alcohol use: No  . Drug use: No     Allergies   Shrimp [shellfish allergy] and Penicillins   Review of Systems Review of Systems  HENT: Positive for nosebleeds.    Respiratory: Positive for shortness of breath and wheezing.   Neurological: Positive for light-headedness.  All other systems reviewed and are negative.    Physical Exam Updated Vital Signs BP 123/65   Pulse 72   Temp 98.2 F (36.8 C) (Oral)   Resp (!) 24   SpO2 94%   Physical Exam Vitals signs and nursing note reviewed.  Constitutional:      General: She is not in  acute distress.    Appearance: She is well-developed.     Comments: NAD.  Bloody gauze in the right nare.  Dried of blood around her mouth.  HENT:     Head: Normocephalic and atraumatic.     Right Ear: External ear normal.     Left Ear: External ear normal.     Nose:     Right Nostril: Epistaxis present.     Comments: Bloody gauze in the right nare.  This was removed and there was a large clot that came out.  Intranasal cavity was cleaned with a Q-tip and there was no signs of anterior bleeding source.  The bleed has stopped.  Septum is midline without hematoma.  No nasal bone tenderness.  Left nare is normal.     Mouth/Throat:     Comments:  Dried up blood around the corners of her mouth and on the tongue and soft palate.  Patient was given water to rinse her mouth.  After this, there was no blood in the oropharynx.  Intraoral cavity normal otherwise. Eyes:     General: No scleral icterus.    Conjunctiva/sclera: Conjunctivae normal.  Neck:     Musculoskeletal: Normal range of motion and neck supple.  Cardiovascular:     Rate and Rhythm: Normal rate and regular rhythm.     Pulses:          Radial pulses are 1+ on the right side and 1+ on the left side.       Dorsalis pedis pulses are 1+ on the right side and 1+ on the left side.     Heart sounds: Normal heart sounds.     Comments: Trace nonpitting edema to the ankles, symmetric.  No calf tenderness. Pulmonary:     Effort: Pulmonary effort is normal.     Breath sounds: Decreased breath sounds, wheezing and rales present.     Comments: SPO2 90% on RA during  conversation, intermittently increases to 94%.  She is speaking in full sentences.  Subtle inspiratory wheezing and crackles with expiration in the middle lobes bilaterally.  Diminished lung sounds to lower lobes.   Musculoskeletal: Normal range of motion.        General: No deformity.  Skin:    General: Skin is warm and dry.     Capillary Refill: Capillary refill takes less than 2 seconds.  Neurological:     Mental Status: She is alert and oriented to person, place, and time.  Psychiatric:        Behavior: Behavior normal.        Thought Content: Thought content normal.        Judgment: Judgment normal.      ED Treatments / Results  Labs (all labs ordered are listed, but only abnormal results are displayed) Labs Reviewed  CBC WITH DIFFERENTIAL/PLATELET - Abnormal; Notable for the following components:      Result Value   Hemoglobin 10.9 (*)    RDW 23.7 (*)    Platelets 712 (*)    All other components within normal limits  BASIC METABOLIC PANEL - Abnormal; Notable for the following components:   Glucose, Bld 129 (*)    BUN 25 (*)    Calcium 8.1 (*)    GFR calc non Af Amer 57 (*)    All other components within normal limits  BRAIN NATRIURETIC PEPTIDE - Abnormal; Notable for the following components:   B Natriuretic Peptide 923.0 (*)    All other components within  normal limits  SARS CORONAVIRUS 2 (HOSPITAL ORDER, [facility])  DIGOXIN LEVEL  TYPE AND SCREEN  ABO/RH    EKG EKG Interpretation  Date/Time:  Tuesday July 28 2018 12:00:14 EDT Ventricular Rate:  67 PR Interval:    QRS Duration: 153 QT Interval:  457 QTC Calculation: 483 R Axis:   77 Text Interpretation:  Sinus rhythm Left bundle branch block Baseline wander in lead(s) II III aVR aVF V3 V4 V6 No significant change since last tracing Confirmed by Dorie Rank [id number]) on 07/28/2018 12:08:53 PM   Radiology Dg Chest 2 View  Result Date: 07/28/2018 CLINICAL DATA:  Nosebleed,  dizziness, hypertension. History of CHF. EXAM: CHEST - 2 VIEW COMPARISON:  CT abdomen dated 03/05/2018. Chest x-ray dated 05/14/2010. Chest CT dated 10/16/2015. FINDINGS: Cardiomegaly. The cardiomegaly is increased compared to the previous chest x-ray of 05/14/2010, similar size compared to the interval chest CT and CT abdomen. Subtle perihilar opacities, most likely mild interstitial edema. No pleural effusion or pneumothorax seen. No acute or suspicious osseous finding. Degenerative spurring throughout the kyphotic thoracolumbar spine. IMPRESSION: Cardiomegaly with mild bilateral interstitial edema suggesting mild CHF/volume overload, suspect chronic mild CHF. Electronically Signed   By: Franki Cabot M.D.   On: 07/28/2018 11:45    Procedures .Critical Care Performed by: Kinnie Feil, PA-C Authorized by: Kinnie Feil, PA-C   Critical care provider statement:    Critical care time (minutes):  45   Critical care was necessary to treat or prevent imminent or life-threatening deterioration of the following conditions:  Cardiac failure and respiratory failure   Critical care was time spent personally by me on the following activities:  Discussions with consultants, evaluation of patient's response to treatment, examination of patient, ordering and performing treatments and interventions, ordering and review of laboratory studies, ordering and review of radiographic studies, pulse oximetry, re-evaluation of patient's condition, obtaining history from patient or surrogate, review of old charts and development of treatment plan with patient or surrogate   I assumed direction of critical care for this patient from another provider in my specialty: no     (including critical care time)  Medications Ordered in ED Medications  furosemide (LASIX) injection 40 mg (has no administration in time range)  nitroGLYCERIN (NITROGLYN) 2 % ointment 1 inch (has no administration in time range)   oxymetazoline (AFRIN) 0.05 % nasal spray 1 spray (1 spray Each Nare Given 07/28/18 1055)  albuterol (VENTOLIN HFA) 108 (90 Base) MCG/ACT inhaler 2 puff (2 puffs Inhalation Given 07/28/18 1135)     Initial Impression / Assessment and Plan / ED Course  I have reviewed the triage vital signs and the nursing notes.  Pertinent labs & imaging results that were available during my care of the patient were reviewed by me and considered in my medical decision making (see chart for details).  Clinical Course as of Jul 27 1413  Tue Jul 28, 2018  1400 B Natriuretic Peptide(!): 923.0 [CG]  1400 Cardiomegaly. The cardiomegaly is increased compared to the previous chest x-ray of 05/14/2010, similar size compared to the interval chest CT and CT abdomen. Subtle perihilar opacities, most likely mild interstitial edema. No pleural effusion or pneumothorax seen. No acute or suspicious osseous finding. Degenerative spurring throughout the kyphotic thoracolumbar spine.  IMPRESSION: Cardiomegaly with mild bilateral interstitial edema suggesting mild CHF/volume overload, suspect chronic mild CHF.  DG Chest 2 View [CG]    Clinical Course User Index [CG] Kinnie Feil, PA-C  No recurrence of epistaxis. No signs to suggest posterior source.  Hgb at baseline.   Pt found to be hypoxemia in the 80s upon arrival to ER. She reports mild SOB, light headedness with walking. No fever, cough, CP, LE swelling or calf pain. H/o CHF but unknown last echo.  H/o anemia. She has wheezing/crackles on exam and trace pedal edema.  Highest on ddx includes CHF exacerbation vs PNA or occult infection although less likely. Doubt PE, she has no CP.   Work up reviewed by me remarkable for BNP 923 and CXR with increased cardiomegaly, interstitial edema suggestive of decompensated HF.  She is on 40 mg lasix daily and complaint.    Will discuss with medicine team for admission for decompensation CHF leading to hypoxemia. No  leukocytosis, infectious symptoms, infiltrate/opacities on x-ray but COVID pening.  Dogixin level pending.   Final Clinical Impressions(s) / ED Diagnoses   Final diagnoses:  Epistaxis  Hypoxemia    ED Discharge Orders    None       Arlean Hopping 07/28/18 1416    Dorie Rank, MD 07/31/18 [phone]

## 2018-07-28 NOTE — ED Notes (Signed)
Pt assisted to bathroom, one person assistance.  Pt c/o dizziness while ambulating.  When returned to room, pts O2 sats checked.  On Room air pt O2 sats 80%.  Pt placed on 2 L O2 by [place].  O2 increased to 92%.  PA Claudia made aware.

## 2018-07-28 NOTE — H&P (Addendum)
History and Physical    Mariah Aguilar SAY:[id number] DOB: 1922/05/25 DOA: 07/28/2018  PCP: Glendale Chard, MD  Patient coming from: Home  I have personally briefly reviewed patient's old medical records in [place]  Chief Complaint: Nosebleed  HPI: Mariah Aguilar is a 85 y.o. female with medical history significant of diastolic congestive heart failure, hypertension, hyperlipidemia and couple other comorbidities as below presented to ED with a complaint of nosebleed.  According to patient, she works out every day early in the morning and that is what she did this morning and while she finished that at around 6:30 AM, she noticed that she started having bleeding from her right side of the nose.  She called EMS, she was treated with Afrin which stopped her nose bleeding and EMS left however her bleeding returned within few minutes and she called EMS again and eventually she was brought into the emergency department.  According to her, she was still bleeding from the right nose when she arrived here.  She does not have any other complaints such as shortness of breath, fever, chills, sweating, chest pain, nausea, vomiting, any problem with urination or with bowel movement.  No sick contact or recent travel.  ED Course: Upon arrival to the ED, she was hemodynamically stable and saturating 94% on room air.  Her nose was packed and her bleeding stopped.  While ED staff walked her in order to get her ready for discharge, she felt dizzy and oxygen saturation dropped to 80% on room air requiring 2 L of oxygen.  Further work-up included chest x-ray which showed cardiomegaly and congestive heart failure.  Elevated BNP of 923.  Normal CBC with baseline anemia and hemoglobin of 10.9.  Almost unremarkable CMP.  COVID-19 is still pending.  She was given a dose of Lasix and hospital service was consulted for admission.  Initially when I saw her, patient was very reluctant to be admitted and wanted to go  home.  After a lot of counseling and convincing, she decided to talk to her granddaughter who convinced her to stay.  Review of Systems: As per HPI otherwise negative.   Past Medical History:  Diagnosis Date   Arthritis    Cardiomyopathy    Cecal neoplasm    CHF (congestive heart failure) (HCC)    Colon polyps    Diverticulosis    Hyperlipidemia    Hypertension    Insomnia    Internal hemorrhoids    Left bundle branch block (LBBB) on electrocardiogram    EKG of 5'12 shows 1st degree AV block/LBBB -Epic.    Past Surgical History:  Procedure Laterality Date   ABDOMINAL HYSTERECTOMY     COLON SURGERY     COLONOSCOPY  05/27/2011   Procedure: COLONOSCOPY;  Surgeon: Juanita Craver, MD;  Location: [facility];  Service: Endoscopy;  Laterality: N/A;   COLONOSCOPY WITH PROPOFOL N/A 06/30/2014   Procedure: COLONOSCOPY WITH PROPOFOL;  Surgeon: Juanita Craver, MD;  Location: [facility];  Service: Endoscopy;  Laterality: N/A;     reports that she has never smoked. She has never used smokeless tobacco. She reports that she does not drink alcohol or use drugs.  Allergies  Allergen Reactions   Shrimp [Shellfish Allergy] Itching and Swelling   Penicillins Hives    Did it involve swelling of the face/tongue/throat, SOB, or low BP? Unknown Did it involve sudden or severe rash/hives, skin peeling, or any reaction on the inside of your mouth or nose? Unknown Did you  need to seek medical attention at a hospital or doctor's office? Unknown When did it last happen? If all above answers are NO, may proceed with cephalosporin use.    Family History  Problem Relation Age of Onset   Cancer Mother        COLON   Stroke Father    Diabetes Sister     Prior to Admission medications   Medication Sig Start Date End Date Taking? Authorizing Provider  amLODipine-valsartan (EXFORGE) 10-320 MG per tablet Take 1 tablet by mouth daily.  12/20/13  Yes [provider]    carvedilol (COREG) 12.5 MG tablet Take 12.5 mg by mouth 2 (two) times daily with a meal.  11/10/13  Yes [provider]  DEXILANT 60 MG capsule TAKE 1 [place] Patient taking differently: Take 60 mg by mouth daily.  07/28/18  Yes Glendale Chard, MD  diazepam (VALIUM) 5 MG tablet Take 2.5 mg by mouth at bedtime as needed for sedation.  12/11/13  Yes [provider]  digoxin (LANOXIN) 0.125 MG tablet Take 0.125 mg by mouth daily.  10/07/13  Yes [provider]  furosemide (LASIX) 40 MG tablet Take 40 mg by mouth daily.    Yes [provider]  Iron Polysacch Cmplx-B12-FA (NIFEREX-150 FORTE PO) Take 150 mg by mouth daily.    Yes [provider]  oxymetazoline (AFRIN) 0.05 % nasal spray Place 1 spray into both nostrils 2 (two) times daily as needed (nose bleeds).   Yes [provider]  potassium chloride (KLOR-CON) 8 MEQ CR tablet Take 8 mEq by mouth daily.     Yes [provider]  Probiotic Product (RESTORA PO) Take 1 tablet by mouth daily.    Yes [provider]  rosuvastatin (CRESTOR) 10 MG tablet Take 10 mg by mouth daily.     Yes [provider]  traMADol (ULTRAM) 50 MG tablet Take 50 mg by mouth as needed for moderate pain.  10/13/17  Yes [provider]  albuterol (VENTOLIN HFA) 108 (90 Base) MCG/ACT inhaler Inhale 2 puffs into the lungs every 6 (six) hours as needed for wheezing or shortness of breath. 07/08/18   Glendale Chard, MD  benzonatate (TESSALON PERLES) 100 MG capsule Take 1 capsule (100 mg total) by mouth 3 (three) times daily as needed for cough. 06/24/18 06/24/19  Glendale Chard, MD    Physical Exam: Vitals:   07/28/18 1036 07/28/18 1230 07/28/18 1300 07/28/18 1400  BP: 127/65 129/68 136/72 123/65  Pulse: 75 63 71 72  Resp: 18 (!) 23 (!) 21 (!) 24  Temp: 98.2 F (36.8 C)     TempSrc: Oral     SpO2: (!) 89% 90% 92% 94%    Constitutional: NAD, calm, comfortable Vitals:    07/28/18 1036 07/28/18 1230 07/28/18 1300 07/28/18 1400  BP: 127/65 129/68 136/72 123/65  Pulse: 75 63 71 72  Resp: 18 (!) 23 (!) 21 (!) 24  Temp: 98.2 F (36.8 C)     TempSrc: Oral     SpO2: (!) 89% 90% 92% 94%   Eyes: PERRL, lids and conjunctivae normal ENMT: Mucous membranes are moist. Posterior pharynx clear of any exudate or lesions.Normal dentition.  Neck: normal, supple, no masses, no thyromegaly Respiratory: Bibasilar crackles with expiratory wheezes in the bases and middle zones bilaterally. Normal respiratory effort. No accessory muscle use.  Cardiovascular: Regular rate and rhythm, no murmurs / rubs / gallops. No extremity edema. 2+ pedal pulses. No carotid bruits.  Abdomen: no tenderness, no masses palpated. No hepatosplenomegaly. Bowel sounds positive.  Musculoskeletal: no clubbing / cyanosis. No joint deformity upper and lower extremities. Good ROM, no contractures. Normal muscle tone.  Skin: no rashes, lesions, ulcers. No induration Neurologic: CN 2-12 grossly intact. Sensation intact, DTR normal. Strength 5/5 in all 4.  Psychiatric: Normal judgment and insight. Alert and oriented x 3. Normal mood.    Labs on Admission: I have personally reviewed following labs and imaging studies  CBC: Recent Labs  Lab 07/22/18 1220 07/28/18 1142  WBC 8.1 9.4  NEUTROABS 5.1 7.2  HGB 11.7* 10.9*  HCT 37.4 36.2  MCV 81.0 86.6  PLT 753* 620*   Basic Metabolic Panel: Recent Labs  Lab 07/22/18 1220 07/28/18 1142  NA 141 140  K 4.4 4.3  CL 102 103  CO2 28 27  GLUCOSE 119* 129*  BUN 21 25*  CREATININE 1.00 0.87  CALCIUM 9.1 8.1*   GFR: Estimated Creatinine Clearance: 35.6 mL/min (by C-G formula based on SCr of 0.87 mg/dL). Liver Function Tests: Recent Labs  Lab 07/22/18 1220  AST 19  ALT 18  ALKPHOS 63  BILITOT 0.5  PROT 8.3*  ALBUMIN 4.4   No results for input(s): LIPASE, AMYLASE in the last 168 hours. No results for input(s): AMMONIA in the last 168  hours. Coagulation Profile: No results for input(s): INR, PROTIME in the last 168 hours. Cardiac Enzymes: No results for input(s): CKTOTAL, CKMB, CKMBINDEX, TROPONINI in the last 168 hours. BNP (last 3 results) No results for input(s): PROBNP in the last 8760 hours. HbA1C: No results for input(s): HGBA1C in the last 72 hours. CBG: No results for input(s): GLUCAP in the last 168 hours. Lipid Profile: No results for input(s): CHOL, HDL, LDLCALC, TRIG, CHOLHDL, LDLDIRECT in the last 72 hours. Thyroid Function Tests: No results for input(s): TSH, T4TOTAL, FREET4, T3FREE, THYROIDAB in the last 72 hours. Anemia Panel: No results for input(s): VITAMINB12, FOLATE, FERRITIN, TIBC, IRON, RETICCTPCT in the last 72 hours. Urine analysis:    Component Value Date/Time   BILIRUBINUR Negative 05/19/2018 1137   PROTEINUR Negative 05/19/2018 1137   UROBILINOGEN 0.2 05/19/2018 1137   NITRITE Negative 05/19/2018 1137   LEUKOCYTESUR Negative 05/19/2018 1137    Radiological Exams on Admission: Dg Chest 2 View  Result Date: 07/28/2018 CLINICAL DATA:  Nosebleed, dizziness, hypertension. History of CHF. EXAM: CHEST - 2 VIEW COMPARISON:  CT abdomen dated 03/05/2018. Chest x-ray dated 05/14/2010. Chest CT dated 10/16/2015. FINDINGS: Cardiomegaly. The cardiomegaly is increased compared to the previous chest x-ray of 05/14/2010, similar size compared to the interval chest CT and CT abdomen. Subtle perihilar opacities, most likely mild interstitial edema. No pleural effusion or pneumothorax seen. No acute or suspicious osseous finding. Degenerative spurring throughout the kyphotic thoracolumbar spine. IMPRESSION: Cardiomegaly with mild bilateral interstitial edema suggesting mild CHF/volume overload, suspect chronic mild CHF. Electronically Signed   By: Franki Cabot M.D.   On: 07/28/2018 11:45    EKG: Independently reviewed.  Sinus rhythm with left bundle branch block.  No acute ST-T wave  changes.  Assessment/Plan Active Problems:   Type 2 diabetes mellitus with stage 2 chronic kidney disease, without long-term current use of insulin (HCC)   HLD (hyperlipidemia)   HTN (hypertension)   Acute on chronic diastolic (congestive) heart failure (HCC)   GERD (gastroesophageal reflux disease)     Acute on chronic diastolic congestive heart failure: Based on previous notes of hospitalization, it seems like patient has a history of diastolic congestive  heart failure and she is on Lasix, carvedilol as well as digoxin at home.  No echo in chart.  She has received a dose of Lasix in the ER.  I will start her on Lasix 40 mg IV twice daily, strict I's and O's and monitor on telemetry and will obtain transthoracic echo with daily weights and low-sodium diet.  COVID-19 is still pending.  Type 2 diabetes mellitus: Not on any home medications for diabetes.  Will place on SSI.  Hypertension: Controlled.  Resume home medications.  CKD stage II: At his line.  Continue to watch.  Epistaxis from right nare: Bleeding stopped.  Watch closely.  CBC in the morning.  Current hemoglobin 10.9.   DVT prophylaxis: Lovenox Code Status: Full code Family Communication: Called and discussed with patient's granddaughter Ulla Gallo and answered several questions to her satisfaction. Disposition Plan: Likely home in next 24 hours. Consults called: None Admission status: Observation   Darliss Cheney MD [facility] Pager [phone]  If 7PM-7AM, please contact night-coverage [email or web address] Password [facility]  07/28/2018, 3:44 PM

## 2018-07-28 NOTE — ED Notes (Signed)
ED TO INPATIENT HANDOFF REPORT  ED Nurse Name and Phone #: Judye Bos Name/Age/Gender Mariah Aguilar 86 y.o. female Room/Bed: WA20/WA20  Code Status   Code Status: Full Code  Home/SNF/Other Home Patient oriented to: self, place, time and situation Is this baseline? Yes   Triage Complete: Triage complete  Chief Complaint Nose Bleed  Triage Note Pt BIBA from home.   Per EMS -   Complains of nosebleed starting this morning. Pt reports able to control bleeding with Afrin PTA but resumed at home, pt called PCP who referred her here. AOx4. Ambulatory on scene. Pt denies taking blood thinners.    Allergies Allergies  Allergen Reactions  . Shrimp [Shellfish Allergy] Itching and Swelling  . Penicillins Hives    Did it involve swelling of the face/tongue/throat, SOB, or low BP? Unknown Did it involve sudden or severe rash/hives, skin peeling, or any reaction on the inside of your mouth or nose? Unknown Did you need to seek medical attention at a hospital or doctor's office? Unknown When did it last happen? If all above answers are "NO", may proceed with cephalosporin use.    Level of Care/Admitting Diagnosis ED Disposition    ED Disposition Condition Comment   Admit  Hospital Area: [place] [[id number]]  Level of Care: Telemetry [5]  Admit to tele based on following criteria: Other see comments  Comments: Acute CHF  Covid Evaluation: Confirmed COVID Negative  Diagnosis: Acute on chronic diastolic (congestive) heart failure [facility]) [[id number]]  Admitting Physician: Darliss Cheney [[id number]]  Attending Physician: Darliss Cheney [[id number]]  PT Class (Do Not Modify): Observation [104]  PT Acc Code (Do Not Modify): Observation [10022]       B Medical/Surgery History Past Medical History:  Diagnosis Date  . Arthritis   . Cardiomyopathy   . Cecal neoplasm   . CHF (congestive heart failure) ([place])   . Colon polyps   . Diverticulosis   .  Hyperlipidemia   . Hypertension   . Insomnia   . Internal hemorrhoids   . Left bundle branch block (LBBB) on electrocardiogram    EKG of 5'12 shows 1st degree AV block/LBBB -Epic.   Past Surgical History:  Procedure Laterality Date  . ABDOMINAL HYSTERECTOMY    . COLON SURGERY    . COLONOSCOPY  05/27/2011   Procedure: COLONOSCOPY;  Surgeon: Juanita Craver, MD;  Location: [facility];  Service: Endoscopy;  Laterality: N/A;  . COLONOSCOPY WITH PROPOFOL N/A 06/30/2014   Procedure: COLONOSCOPY WITH PROPOFOL;  Surgeon: Juanita Craver, MD;  Location: [facility];  Service: Endoscopy;  Laterality: N/A;     A IV Location/Drains/Wounds Patient Lines/Drains/Airways Status   Active Line/Drains/Airways    Name:   Placement date:   Placement time:   Site:   Days:   Peripheral IV 07/28/18 Left;Proximal Forearm   07/28/18    1133    Forearm   less than 1          Intake/Output Last 24 hours  Intake/Output Summary (Last 24 hours) at 07/28/2018 1946 Last data filed at 07/28/2018 1833 Gross per 24 hour  Intake -  Output 1200 ml  Net -1200 ml    Labs/Imaging Results for orders placed or performed during the hospital encounter of 07/28/18 (from the past 48 hour(s))  Type and screen [place]     Status: None   Collection Time: 07/28/18 11:35 AM  Result Value Ref Range   ABO/RH(D) O POS    Antibody Screen  NEG    Sample Expiration      07/31/2018,2359 Performed at [facility], [place] [address], [place], [place] [address]   ABO/Rh     Status: None (Preliminary result)   Collection Time: 07/28/18 11:36 AM  Result Value Ref Range   ABO/RH(D)      O POS Performed at [facility], [place] [address] [place], [place] [address]   Brain natriuretic peptide     Status: Abnormal   Collection Time: 07/28/18 11:41 AM  Result Value Ref Range   B Natriuretic Peptide 923.0 (H) 0.0 - 100.0 pg/mL    Comment: Performed at [facility], [place] [address] [place], [place] [address]  CBC with Differential     Status: Abnormal   Collection Time: 07/28/18 11:42 AM  Result Value Ref Range   WBC 9.4 4.0 - 10.5 K/uL   RBC 4.18 3.87 - 5.11 MIL/uL   Hemoglobin 10.9 (L) 12.0 - 15.0 g/dL   HCT 36.2 36.0 - 46.0 %   MCV 86.6 80.0 - 100.0 fL   MCH 26.1 26.0 - 34.0 pg   MCHC 30.1 30.0 - 36.0 g/dL   RDW 23.7 (H) 11.5 - 15.5 %   Platelets 712 (H) 150 - 400 K/uL   nRBC 0.0 0.0 - 0.2 %   Neutrophils Relative % 77 %   Neutro Abs 7.2 1.7 - 7.7 K/uL   Lymphocytes Relative 14 %   Lymphs Abs 1.4 0.7 - 4.0 K/uL   Monocytes Relative 6 %   Monocytes Absolute 0.6 0.1 - 1.0 K/uL   Eosinophils Relative 2 %   Eosinophils Absolute 0.2 0.0 - 0.5 K/uL   Basophils Relative 1 %   Basophils Absolute 0.1 0.0 - 0.1 K/uL   Immature Granulocytes 0 %   Abs Immature Granulocytes 0.03 0.00 - 0.07 K/uL    Comment: Performed at [facility], [place] [address] [place], [place] [address]  Basic metabolic panel     Status: Abnormal   Collection Time: 07/28/18 11:42 AM  Result Value Ref Range   Sodium 140 135 - 145 mmol/L   Potassium 4.3 3.5 - 5.1 mmol/L   Chloride 103 98 - 111 mmol/L   CO2 27 22 - 32 mmol/L   Glucose, Bld 129 (H) 70 - 99 mg/dL   BUN 25 (H) 8 - 23 mg/dL   Creatinine, Ser 0.87 0.44 - 1.00 mg/dL   Calcium 8.1 (L) 8.9 - 10.3 mg/dL   GFR calc non Af Amer 57 (L) >60 mL/min   GFR calc Af Amer >60 >60 mL/min   Anion gap 10 5 - 15    Comment: Performed at [facility] [place] [address], [place], [place] [address]  SARS Coronavirus 2 (CEPHEID - Performed in [place] hospital lab), Hosp Order     Status: None   Collection Time: 07/28/18  2:52 PM   Specimen: Nasopharyngeal Swab  Result Value Ref Range   SARS Coronavirus 2 NEGATIVE NEGATIVE    Comment: (NOTE) If result is NEGATIVE SARS-CoV-2 target nucleic acids are NOT DETECTED. The SARS-CoV-2 RNA is generally detectable in upper and lower   respiratory specimens during the acute phase of infection. The lowest  concentration of SARS-CoV-2 viral copies this assay can detect is 250  copies / mL. A negative result does not preclude SARS-CoV-2 infection  and should not be used as the sole basis for treatment or other  patient management decisions.  A  negative result may occur with  improper specimen collection / handling, submission of specimen other  than nasopharyngeal swab, presence of viral mutation(s) within the  areas targeted by this assay, and inadequate number of viral copies  (<250 copies / mL). A negative result must be combined with clinical  observations, patient history, and epidemiological information. If result is POSITIVE SARS-CoV-2 target nucleic acids are DETECTED. The SARS-CoV-2 RNA is generally detectable in upper and lower  respiratory specimens dur ing the acute phase of infection.  Positive  results are indicative of active infection with SARS-CoV-2.  Clinical  correlation with patient history and other diagnostic information is  necessary to determine patient infection status.  Positive results do  not rule out bacterial infection or co-infection with other viruses. If result is PRESUMPTIVE POSTIVE SARS-CoV-2 nucleic acids MAY BE PRESENT.   A presumptive positive result was obtained on the submitted specimen  and confirmed on repeat testing.  While 2019 novel coronavirus  (SARS-CoV-2) nucleic acids may be present in the submitted sample  additional confirmatory testing may be necessary for epidemiological  and / or clinical management purposes  to differentiate between  SARS-CoV-2 and other Sarbecovirus currently known to infect humans.  If clinically indicated additional testing with an alternate test  methodology [phone]) is advised. The SARS-CoV-2 RNA is generally  detectable in upper and lower respiratory sp ecimens during the acute  phase of infection. The expected result is Negative. Fact  Sheet for Patients:  StrictlyIdeas.no Fact Sheet for Healthcare Providers: BankingDealers.co.za This test is not yet approved or cleared by the [place] FDA and has been authorized for detection and/or diagnosis of SARS-CoV-2 by FDA under an Emergency Use Authorization (EUA).  This EUA will remain in effect (meaning this test can be used) for the duration of the COVID-19 declaration under Section 564(b)(1) of the Act, 21 U.S.C. section 360bbb-3(b)(1), unless the authorization is terminated or revoked sooner. Performed at [facility], [place] [address], [place], [place] [address]   Digoxin level     Status: Abnormal   Collection Time: 07/28/18  2:53 PM  Result Value Ref Range   Digoxin Level 0.4 (L) 0.8 - 2.0 ng/mL    Comment: Performed at [facility], [place] [address] [place], [place] [address]   Dg Chest 2 View  Result Date: 07/28/2018 CLINICAL DATA:  Nosebleed, dizziness, hypertension. History of CHF. EXAM: CHEST - 2 VIEW COMPARISON:  CT abdomen dated 03/05/2018. Chest x-ray dated 05/14/2010. Chest CT dated 10/16/2015. FINDINGS: Cardiomegaly. The cardiomegaly is increased compared to the previous chest x-ray of 05/14/2010, similar size compared to the interval chest CT and CT abdomen. Subtle perihilar opacities, most likely mild interstitial edema. No pleural effusion or pneumothorax seen. No acute or suspicious osseous finding. Degenerative spurring throughout the kyphotic thoracolumbar spine. IMPRESSION: Cardiomegaly with mild bilateral interstitial edema suggesting mild CHF/volume overload, suspect chronic mild CHF. Electronically Signed   By: Franki Cabot M.D.   On: 07/28/2018 11:45    Pending Labs [employer] (From admission, onward)    Start     Ordered   Signed and Corporate treasurer  Daily,   R     Signed and Held   Signed and Held  CBC  (enoxaparin (LOVENOX)    CrCl >/= 30  ml/min)  Once,   R    Comments: Baseline for enoxaparin therapy IF NOT ALREADY DRAWN.  Notify MD if PLT < 100 K.    Signed and  Held   Signed and Held  Creatinine, serum  (enoxaparin (LOVENOX)    CrCl >/= 30 ml/min)  Once,   R    Comments: Baseline for enoxaparin therapy IF NOT ALREADY DRAWN.    Signed and Held   Signed and Held  Creatinine, serum  (enoxaparin (LOVENOX)    CrCl >/= 30 ml/min)  Weekly,   R    Comments: while on enoxaparin therapy    Signed and Held          Vitals/Pain Today's Vitals   07/28/18 1800 07/28/18 1900 07/28/18 1930 07/28/18 1945  BP: 106/88 134/69 (!) 133/58   Pulse: 64 79 73 74  Resp: 18     Temp:      TempSrc:      SpO2: 95% 96% 97% 95%  PainSc:        Isolation Precautions No active isolations  Medications Medications  traMADol (ULTRAM) tablet 50 mg (has no administration in time range)  amLODipine-valsartan (EXFORGE) 10-320 MG per tablet 1 tablet (has no administration in time range)  carvedilol (COREG) tablet 12.5 mg (has no administration in time range)  digoxin (LANOXIN) tablet 0.125 mg (has no administration in time range)  albuterol (VENTOLIN HFA) 108 (90 Base) MCG/ACT inhaler 2 puff (2 puffs Inhalation Given 07/28/18 1829)  furosemide (LASIX) injection 40 mg (40 mg Intravenous Not Given 07/28/18 1806)  insulin aspart (novoLOG) injection 0-15 Units (has no administration in time range)  insulin aspart (novoLOG) injection 0-5 Units (has no administration in time range)  oxymetazoline (AFRIN) 0.05 % nasal spray 1 spray (1 spray Each Nare Given 07/28/18 1055)  albuterol (VENTOLIN HFA) 108 (90 Base) MCG/ACT inhaler 2 puff (2 puffs Inhalation Given 07/28/18 1135)  furosemide (LASIX) injection 40 mg (40 mg Intravenous Given 07/28/18 1447)  nitroGLYCERIN (NITROGLYN) 2 % ointment 1 inch (1 inch Topical Given 07/28/18 1450)    Mobility walks with person assist High fall risk   Focused Assessments NA   R Recommendations: See Admitting  Provider Note  Report given to:   Additional Notes: NA

## 2018-07-28 NOTE — ED Notes (Signed)
Called PT/OT left message to put on list for PT/OT first thing in the morning.

## 2018-07-28 NOTE — ED Notes (Signed)
Pt provided with crackers, soup, apple juice per her request.

## 2018-07-28 NOTE — ED Notes (Signed)
Spoke with Larkin Ina in Pharmacy regarding lasix administration times being close together.  40 mg IV given at 1447 today.  1st daily dose ordered to begin at 1800.  Per Pharmacist, it is recommended to hold initial dose in order to prevent administration times too close together.

## 2018-07-28 NOTE — ED Notes (Signed)
Patient transported to X-ray 

## 2018-07-28 NOTE — ED Notes (Addendum)
While transporting pt to 4W, blood began draining in her mouth. Took her back to ED, and it was recommended that she go to the floor. Transporting back to 4W. Messaged attending.

## 2018-07-28 NOTE — ED Triage Notes (Signed)
Pt BIBA from home.   Per EMS -   Complains of nosebleed starting this morning. Pt reports able to control bleeding with Afrin PTA but resumed at home, pt called PCP who referred her here. AOx4. Ambulatory on scene. Pt denies taking blood thinners.

## 2018-07-29 ENCOUNTER — Telehealth: Payer: Self-pay

## 2018-07-29 ENCOUNTER — Observation Stay (HOSPITAL_BASED_OUTPATIENT_CLINIC_OR_DEPARTMENT_OTHER): Payer: Medicare Other

## 2018-07-29 DIAGNOSIS — Z91013 Allergy to seafood: Secondary | ICD-10-CM | POA: Diagnosis not present

## 2018-07-29 DIAGNOSIS — I447 Left bundle-branch block, unspecified: Secondary | ICD-10-CM | POA: Diagnosis not present

## 2018-07-29 DIAGNOSIS — I428 Other cardiomyopathies: Secondary | ICD-10-CM | POA: Diagnosis not present

## 2018-07-29 DIAGNOSIS — I361 Nonrheumatic tricuspid (valve) insufficiency: Secondary | ICD-10-CM | POA: Diagnosis not present

## 2018-07-29 DIAGNOSIS — K219 Gastro-esophageal reflux disease without esophagitis: Secondary | ICD-10-CM | POA: Diagnosis not present

## 2018-07-29 DIAGNOSIS — I5043 Acute on chronic combined systolic (congestive) and diastolic (congestive) heart failure: Secondary | ICD-10-CM | POA: Diagnosis not present

## 2018-07-29 DIAGNOSIS — Z880 Allergy status to penicillin: Secondary | ICD-10-CM | POA: Diagnosis not present

## 2018-07-29 DIAGNOSIS — G4700 Insomnia, unspecified: Secondary | ICD-10-CM | POA: Diagnosis present

## 2018-07-29 DIAGNOSIS — I10 Essential (primary) hypertension: Secondary | ICD-10-CM | POA: Diagnosis not present

## 2018-07-29 DIAGNOSIS — I340 Nonrheumatic mitral (valve) insufficiency: Secondary | ICD-10-CM

## 2018-07-29 DIAGNOSIS — E1122 Type 2 diabetes mellitus with diabetic chronic kidney disease: Secondary | ICD-10-CM

## 2018-07-29 DIAGNOSIS — R040 Epistaxis: Secondary | ICD-10-CM | POA: Diagnosis not present

## 2018-07-29 DIAGNOSIS — E785 Hyperlipidemia, unspecified: Secondary | ICD-10-CM | POA: Diagnosis not present

## 2018-07-29 DIAGNOSIS — D62 Acute posthemorrhagic anemia: Secondary | ICD-10-CM

## 2018-07-29 DIAGNOSIS — Z90710 Acquired absence of both cervix and uterus: Secondary | ICD-10-CM | POA: Diagnosis not present

## 2018-07-29 DIAGNOSIS — N182 Chronic kidney disease, stage 2 (mild): Secondary | ICD-10-CM | POA: Diagnosis not present

## 2018-07-29 DIAGNOSIS — Z20828 Contact with and (suspected) exposure to other viral communicable diseases: Secondary | ICD-10-CM | POA: Diagnosis not present

## 2018-07-29 DIAGNOSIS — I130 Hypertensive heart and chronic kidney disease with heart failure and stage 1 through stage 4 chronic kidney disease, or unspecified chronic kidney disease: Secondary | ICD-10-CM | POA: Diagnosis not present

## 2018-07-29 DIAGNOSIS — Z79899 Other long term (current) drug therapy: Secondary | ICD-10-CM | POA: Diagnosis not present

## 2018-07-29 DIAGNOSIS — R0902 Hypoxemia: Secondary | ICD-10-CM | POA: Diagnosis not present

## 2018-07-29 LAB — HEMOGLOBIN AND HEMATOCRIT, BLOOD
HCT: 28.8 % — ABNORMAL LOW (ref 36.0–46.0)
HCT: 30.5 % — ABNORMAL LOW (ref 36.0–46.0)
HCT: 29.3 % — ABNORMAL LOW (ref 36.0–46.0)
Hemoglobin: 8.6 g/dL — ABNORMAL LOW (ref 12.0–15.0)
Hemoglobin: 9.2 g/dL — ABNORMAL LOW (ref 12.0–15.0)
Hemoglobin: 8.9 g/dL — ABNORMAL LOW (ref 12.0–15.0)

## 2018-07-29 LAB — GLUCOSE, CAPILLARY
Glucose-Capillary: 112 mg/dL — ABNORMAL HIGH (ref 70–99)
Glucose-Capillary: 138 mg/dL — ABNORMAL HIGH (ref 70–99)
Glucose-Capillary: 106 mg/dL — ABNORMAL HIGH (ref 70–99)
Glucose-Capillary: 122 mg/dL — ABNORMAL HIGH (ref 70–99)

## 2018-07-29 LAB — BASIC METABOLIC PANEL
Anion gap: 11 (ref 5–15)
Anion gap: 12 (ref 5–15)
BUN: 53 mg/dL — ABNORMAL HIGH (ref 8–23)
BUN: 49 mg/dL — ABNORMAL HIGH (ref 8–23)
CO2: 25 mmol/L (ref 22–32)
CO2: 26 mmol/L (ref 22–32)
Calcium: 7.8 mg/dL — ABNORMAL LOW (ref 8.9–10.3)
Calcium: 8.4 mg/dL — ABNORMAL LOW (ref 8.9–10.3)
Chloride: 107 mmol/L (ref 98–111)
Chloride: 106 mmol/L (ref 98–111)
Creatinine, Ser: 0.78 mg/dL (ref 0.44–1.00)
Creatinine, Ser: 0.96 mg/dL (ref 0.44–1.00)
GFR calc Af Amer: >60 mL/min (ref >60)
GFR calc Af Amer: 59 mL/min — ABNORMAL LOW (ref >60)
GFR calc non Af Amer: >60 mL/min (ref >60)
GFR calc non Af Amer: 51 mL/min — ABNORMAL LOW (ref >60)
Glucose, Bld: 117 mg/dL — ABNORMAL HIGH (ref 70–99)
Glucose, Bld: 149 mg/dL — ABNORMAL HIGH (ref 70–99)
Potassium: 3.7 mmol/L (ref 3.5–5.1)
Potassium: 3.2 mmol/L — ABNORMAL LOW (ref 3.5–5.1)
Sodium: 143 mmol/L (ref 135–145)
Sodium: 144 mmol/L (ref 135–145)

## 2018-07-29 LAB — ECHOCARDIOGRAM COMPLETE
Height: 63 in
Weight: 2031.76 oz

## 2018-07-29 MED ORDER — ALBUTEROL SULFATE (2.5 MG/3ML) 0.083% IN NEBU: 2.5000 mg | INHALATION_SOLUTION | RESPIRATORY_TRACT | Status: DC | PRN

## 2018-07-29 MED ORDER — ALBUTEROL SULFATE (2.5 MG/3ML) 0.083% IN NEBU
2.5000 mg | INHALATION_SOLUTION | Freq: Two times a day (BID) | RESPIRATORY_TRACT | Status: DC
  Administered 2018-07-30 (×2): 2.5 mg via RESPIRATORY_TRACT
  Filled 2018-07-29 (×3): qty 3

## 2018-07-29 MED ORDER — ALBUTEROL SULFATE (2.5 MG/3ML) 0.083% IN NEBU
2.5000 mg | INHALATION_SOLUTION | Freq: Four times a day (QID) | RESPIRATORY_TRACT | Status: DC
  Administered 2018-07-29: 2.5 mg via RESPIRATORY_TRACT
  Filled 2018-07-29 (×2): qty 3

## 2018-07-29 MED ORDER — CLINDAMYCIN HCL 300 MG PO CAPS
300.0000 mg | ORAL_CAPSULE | Freq: Three times a day (TID) | ORAL | Status: DC
  Administered 2018-07-29 – 2018-07-31 (×8): 300 mg via ORAL
  Filled 2018-07-29 (×8): qty 1

## 2018-07-29 NOTE — Telephone Encounter (Signed)
I called the pt to check on her for Dr. Baird Cancer because the pt has been admitted to the hospital and the pt said that she is doing fine.

## 2018-07-29 NOTE — Plan of Care (Signed)
  Problem: Education: Goal: Knowledge of General Education information will improve Description Including pain rating scale, medication(s)/side effects and non-pharmacologic comfort measures Outcome: Progressing   Problem: Health Behavior/Discharge Planning: Goal: Ability to manage health-related needs will improve Outcome: Progressing   

## 2018-07-29 NOTE — Progress Notes (Signed)
Pt with two episodes of epistaxis from right nare so far this shift. First episode around 21:30 with large clots. Second episode only a small clot seen. VSS. Outside ice saline dressing changed. Will continue to monitor.

## 2018-07-29 NOTE — Progress Notes (Signed)
Pt resting, dressing to right nare remain dry and intact. Denies chest pain. SRP, RN

## 2018-07-29 NOTE — Progress Notes (Signed)
Notified K. Schorr TRH of pts potassium level of 3.2. Awaiting any new orders.

## 2018-07-29 NOTE — Progress Notes (Addendum)
Reenforced dressing to nasal area, previous dressing saturated with bright red blood. Denies pain. Will cont to monitor.  MD made aware.  SRP, RN

## 2018-07-29 NOTE — Progress Notes (Addendum)
PROGRESS NOTE  Mariah Aguilar JYN:[id number] DOB: 20-Sep-1922 DOA: 07/28/2018 PCP: Glendale Chard, MD  Brief History    Mariah Aguilar is a 85 y.o. female with medical history significant of diastolic congestive heart failure, hypertension, hyperlipidemia and couple other comorbidities as below presented to ED with a complaint of nosebleed.  According to patient, she works out every day early in the morning and that is what she did this morning and while she finished that at around 6:30 AM, she noticed that she started having bleeding from her right side of the nose.  She called EMS, she was treated with Afrin which stopped her nose bleeding and EMS left however her bleeding returned within few minutes and she called EMS again and eventually she was brought into the emergency department.  According to her, she was still bleeding from the right nose when she arrived here.  She does not have any other complaints such as shortness of breath, fever, chills, sweating, chest pain, nausea, vomiting, any problem with urination or with bowel movement.  No sick contact or recent travel.  ED Course: Upon arrival to the ED, she was hemodynamically stable and saturating 94% on room air.  Her nose was packed and her bleeding stopped.  While ED staff walked her in order to get her ready for discharge, she felt dizzy and oxygen saturation dropped to 80% on room air requiring 2 L of oxygen.  Further work-up included chest x-ray which showed cardiomegaly and congestive heart failure.  Elevated BNP of 923.  Normal CBC with baseline anemia and hemoglobin of 10.9.  Almost unremarkable CMP.  COVID-19 is still pending.  She was given a dose of Lasix and hospital service was consulted for admission.  Initially when I saw her, patient was very reluctant to be admitted and wanted to go home.  After a lot of counseling and convincing, she decided to talk to her granddaughter who convinced her to stay.  ENT was consulted and  placed packing in her left nare. Her hemoglobin dropped more than a gram overnight.  Consultants  . ENT  Procedures  . Right nare packed   Antibiotics   Anti-infectives (From admission, onward)   Start     Dose/Rate Route Frequency Ordered Stop   07/29/18 0600  clindamycin (CLEOCIN) capsule 300 mg     300 mg Oral Every 8 hours 07/29/18 0309      .   Subjective  The patient is resting quietly. No new complaints.  Objective   Vitals:  Vitals:   07/29/18 1349 07/29/18 1402  BP: 115/64   Pulse: 73 76  Resp: 19 18  Temp: 97.7 F (36.5 C)   SpO2: 94% 96%    Exam:  Constitutional:  . The patient is awake, alert, and oriented x 3. No acute distress. ENMT:  . Right nare is packed  .  Respiratory:  . CTA bilaterally, no w/r/r.  . Respiratory effort normal. No retractions or accessory muscle use Cardiovascular:  . RRR, no m/r/g . No LE extremity edema   . Normal pedal pulses Abdomen:  . Abdomen appears normal; no tenderness or masses . No hernias . No HSM Musculoskeletal:  . No cyanosis, clubbing, or edema. Skin:  . No rashes, lesions, ulcers . palpation of skin: no induration or nodules Neurologic:  . CN 2-12 intact . Sensation all 4 extremities intact   I have personally reviewed the following:   Today's Data  . CBC, BMP, Vitals  Cardiology Data  .  EKG: Left bundle branch block. Unchanged from previous . Echocardiogram: EF 20-25%. Dilated LV. Positive for diastolic dysfunction. Paradoxical septal motion consistent with left bundle branch block.    Scheduled Meds: . albuterol  2.5 mg Nebulization Q6H  . amLODipine  10 mg Oral Daily   And  . irbesartan  300 mg Oral Daily  . carvedilol  12.5 mg Oral BID  . clindamycin  300 mg Oral Q8H  . digoxin  0.125 mg Oral Daily  . enoxaparin (LOVENOX) injection  40 mg Subcutaneous Q24H  . furosemide  40 mg Intravenous BID  . insulin aspart  0-15 Units Subcutaneous TID WC  . insulin aspart  0-5 Units  Subcutaneous QHS  . pantoprazole  40 mg Oral Daily  . rosuvastatin  10 mg Oral Daily  . sodium chloride flush  3 mL Intravenous Q12H   Continuous Infusions: . sodium chloride      Active Problems:   Type 2 diabetes mellitus with stage 2 chronic kidney disease, without long-term current use of insulin (HCC)   HLD (hyperlipidemia)   HTN (hypertension)   Acute on chronic diastolic (congestive) heart failure (HCC)   GERD (gastroesophageal reflux disease)   LOS: 0 days   Epistaxis: Recurrent today. Packing placed by ENT. To remain in place x 5 days. Pt is receiving PO cleocin. Monitor hemoglobin and transfuse for less than 7.0 or symptomatic anemia.  Acute on chronic combined systolic and diastolic heart failure: EF 20-25%. Diastolic dysfunction. Paradoxical septal motion due to left bundle branch block. Monitor voluem status and caution with IV fluids. Blood pressures are marginal, and I doubt that the patient can tolerate ACE I. She is on metoprolol and digoxin. She is also receiving IV lasix.   Hypertension: Blood pressures in the 110's on metoprolol. Monitor. Cannot tolerate ACE I at least with diuresis on lasix. May try to add ace when can convert back to oral diuresis.  Hyperlipidemia: Continue crestor.  DM II: Glucoses will be followed by FSBS and SSI.   I have seen and examined this patient myself. I have spent 38 minutes in her evaluation and care.  DVT Prophylaxis: SCD's CODE STATUS: Full code Family Communication: None available Disposition: Home

## 2018-07-29 NOTE — Progress Notes (Addendum)
Called to pt room noted bleeding to right nasal with clots. Pressure dressing applied with ice. Will cont to monitor and told pt to stay on bedrest and call for assistance. Pt was up the the bathroom and straining to return . MD made aware. SRP, RN

## 2018-07-29 NOTE — Progress Notes (Signed)
Pt up to bathroom large amount of bloody drainage from nose, start to c/o of chest pain and SOB. Rapid Response calle, pt with large amounts of clots from right nare. Packing remain intact. ENT Dr. Benjamine Mola called.  Iced pressure dressing applied. EKG completed. Pt VS noted in the computer. Chest pain improved. SRP, RN

## 2018-07-29 NOTE — Progress Notes (Signed)
NP Schorr, K was made aware that Pt still had nose bleed. NP. ordered labs and will ask ENT to come to evaluate PT.Direct  Pressure and Ice applied , little effect noted,clot formation noted., but pt still c/o bleeding back to her throat. Awaiting for ENT.Emotional support given.

## 2018-07-29 NOTE — Progress Notes (Signed)
  Echocardiogram 2D Echocardiogram has been performed.  Mariah Aguilar 07/29/2018, 9:15 AM

## 2018-07-29 NOTE — Consult Note (Signed)
Reason for Consult: Severe right epistaxis  HPI:  Mariah Aguilar is an 85 y.o. female who was admitted yesterday for control of her right sided epistaxis. The patient has a medical history of diastolic congestive heart failure, hypertension, and hyperlipidemia.  According to patient, she noticed that she started having bleeding from her right side of the nose yesterday morning.  She called EMS, she was treated with Afrin which stopped her nose bleeding and EMS left however her bleeding returned within few minutes and she called EMS again and eventually she was brought into the emergency department.  According to her, she was still bleeding from the right nose when she arrived here.  She does not have any other complaints such as shortness of breath, fever, chills, sweating, chest pain, nausea, vomiting, any problem with urination or with bowel movement.  No sick contact or recent travel. She was treated with rhinorocket packing, but continues to have right sided anterior and posterior bleeding.  Past Medical History:  Diagnosis Date  . Arthritis   . Cardiomyopathy   . Cecal neoplasm   . CHF (congestive heart failure) ([place])   . Colon polyps   . Diverticulosis   . Hyperlipidemia   . Hypertension   . Insomnia   . Internal hemorrhoids   . Left bundle branch block (LBBB) on electrocardiogram    EKG of 5'12 shows 1st degree AV block/LBBB -Epic.    Past Surgical History:  Procedure Laterality Date  . ABDOMINAL HYSTERECTOMY    . COLON SURGERY    . COLONOSCOPY  05/27/2011   Procedure: COLONOSCOPY;  Surgeon: Juanita Craver, MD;  Location: [facility];  Service: Endoscopy;  Laterality: N/A;  . COLONOSCOPY WITH PROPOFOL N/A 06/30/2014   Procedure: COLONOSCOPY WITH PROPOFOL;  Surgeon: Juanita Craver, MD;  Location: [facility];  Service: Endoscopy;  Laterality: N/A;    Family History  Problem Relation Age of Onset  . Cancer Mother        COLON  . Stroke Father   . Diabetes Sister     Social  History:  reports that she has never smoked. She has never used smokeless tobacco. She reports that she does not drink alcohol or use drugs.  Allergies:  Allergies  Allergen Reactions  . Shrimp [Shellfish Allergy] Itching and Swelling  . Penicillins Hives    Did it involve swelling of the face/tongue/throat, SOB, or low BP? Unknown Did it involve sudden or severe rash/hives, skin peeling, or any reaction on the inside of your mouth or nose? Unknown Did you need to seek medical attention at a hospital or doctor's office? Unknown When did it last happen? If all above answers are "NO", may proceed with cephalosporin use.    Prior to Admission medications   Medication Sig Start Date End Date Taking? Authorizing Provider  amLODipine-valsartan (EXFORGE) 10-320 MG per tablet Take 1 tablet by mouth daily.  12/20/13  Yes [provider]  carvedilol (COREG) 12.5 MG tablet Take 12.5 mg by mouth 2 (two) times daily with a meal.  11/10/13  Yes [provider]  DEXILANT 60 MG capsule TAKE 1 [place] Patient taking differently: Take 60 mg by mouth daily.  07/28/18  Yes Glendale Chard, MD  diazepam (VALIUM) 5 MG tablet Take 2.5 mg by mouth at bedtime as needed for sedation.  12/11/13  Yes [provider]  digoxin (LANOXIN) 0.125 MG tablet Take 0.125 mg by mouth daily.  10/07/13  Yes [provider]  furosemide (  LASIX) 40 MG tablet Take 40 mg by mouth daily.    Yes [provider]  Iron Polysacch Cmplx-B12-FA (NIFEREX-150 FORTE PO) Take 150 mg by mouth daily.    Yes [provider]  oxymetazoline (AFRIN) 0.05 % nasal spray Place 1 spray into both nostrils 2 (two) times daily as needed (nose bleeds).   Yes [provider]  potassium chloride (KLOR-CON) 8 MEQ CR tablet Take 8 mEq by mouth daily.     Yes [provider]  Probiotic Product (RESTORA PO) Take 1 tablet by mouth daily.    Yes [provider]   rosuvastatin (CRESTOR) 10 MG tablet Take 10 mg by mouth daily.     Yes [provider]  traMADol (ULTRAM) 50 MG tablet Take 50 mg by mouth as needed for moderate pain.  10/13/17  Yes [provider]  albuterol (VENTOLIN HFA) 108 (90 Base) MCG/ACT inhaler Inhale 2 puffs into the lungs every 6 (six) hours as needed for wheezing or shortness of breath. 07/08/18   Glendale Chard, MD  benzonatate (TESSALON PERLES) 100 MG capsule Take 1 capsule (100 mg total) by mouth 3 (three) times daily as needed for cough. 06/24/18 06/24/19  Glendale Chard, MD    Medications:  I have reviewed the patient's current medications. Scheduled: . amLODipine  10 mg Oral Daily   And  . irbesartan  300 mg Oral Daily  . carvedilol  12.5 mg Oral BID  . digoxin  0.125 mg Oral Daily  . enoxaparin (LOVENOX) injection  40 mg Subcutaneous Q24H  . furosemide  40 mg Intravenous BID  . insulin aspart  0-15 Units Subcutaneous TID WC  . insulin aspart  0-5 Units Subcutaneous QHS  . pantoprazole  40 mg Oral Daily  . rosuvastatin  10 mg Oral Daily  . sodium chloride flush  3 mL Intravenous Q12H   Continuous: . sodium chloride       Dg Chest 2 View  Result Date: 07/28/2018 CLINICAL DATA:  Nosebleed, dizziness, hypertension. History of CHF. EXAM: CHEST - 2 VIEW COMPARISON:  CT abdomen dated 03/05/2018. Chest x-ray dated 05/14/2010. Chest CT dated 10/16/2015. FINDINGS: Cardiomegaly. The cardiomegaly is increased compared to the previous chest x-ray of 05/14/2010, similar size compared to the interval chest CT and CT abdomen. Subtle perihilar opacities, most likely mild interstitial edema. No pleural effusion or pneumothorax seen. No acute or suspicious osseous finding. Degenerative spurring throughout the kyphotic thoracolumbar spine. IMPRESSION: Cardiomegaly with mild bilateral interstitial edema suggesting mild CHF/volume overload, suspect chronic mild CHF. Electronically Signed   By: Franki Cabot M.D.   On:  07/28/2018 11:45   Review of Systems  HENT: Positive for nosebleeds.   Respiratory: Positive for shortness of breath and wheezing.   Neurological: Positive for light-headedness.  All other systems reviewed and are negative.  Blood pressure 121/73, pulse 83, temperature 98 F (36.7 C), temperature source Oral, resp. rate 20, weight 60.7 kg, SpO2 97 %. Physical Exam: General: NAD, resting in bed. Eyes: Pupils are equal, round, reactive to light. Extraocular motion is intact.  Ears: Examination of the ears shows normal auricles and external auditory canals bilaterally.  Nose: Active bleeding from the right nostril and down the pharynx. Face: Facial examination shows no asymmetry. Palpation of the face elicit no significant tenderness.  Mouth: Oral cavity examination shows no mucosal lacerations. No significant trismus is noted.  Neck: Palpation of the neck reveals no lymphadenopathy or mass. The trachea is midline. The thyroid is not significantly  enlarged.  Neuro: Cranial nerves 2-12 are all grossly in tact. Respiratory: Normal respiratory effort. No accessory muscle use.  Cardiovascular: Regular rate and rhythm. Musculoskeletal: no clubbing / cyanosis. No joint deformity upper and lower extremities. Good ROM, no contractures. Normal muscle tone.  Skin: no rashes, lesions, ulcers. No induration Neurologic: CN 2-12 grossly intact.  Psychiatric: Normal judgment and insight. Alert and oriented x 3. Normal mood.   Procedure: Anterior/Posterior nasal packing for control of right epistaxis. Anesthesia: Topical xylocaine and Afrin Description: The patient is placed upright in her hospital bed.  Blood clot is suctions from the right nasal cavity. Bleeding is noted from anterior and posterior right nasal cavity.Topical xylocaine and Afrin are applied. An 8 cm Merocel packing is placed in the right nasal cavity with good hemostasis.  The patient tolerated the procedure  well.  Assessment/Plan: Severe right epistaxis. - Controlled with Merocel AP packing. Will leave packing in place for approximately 5 days. - Will need abx coverage while the packing is in place.  Samy Ryner W Mariah Aguilar 07/29/2018, 2:59 AM

## 2018-07-29 NOTE — Progress Notes (Signed)
Right Nasal bleeding decreased dressing remain intact. SRP, RN

## 2018-07-30 ENCOUNTER — Telehealth: Payer: Self-pay | Admitting: Internal Medicine

## 2018-07-30 DIAGNOSIS — R040 Epistaxis: Secondary | ICD-10-CM

## 2018-07-30 LAB — CBC WITH DIFFERENTIAL/PLATELET
Abs Immature Granulocytes: 0.02 10*3/uL (ref 0.00–0.07)
Basophils Absolute: 0.1 10*3/uL (ref 0.0–0.1)
Basophils Relative: 1 %
Eosinophils Absolute: 0.3 10*3/uL (ref 0.0–0.5)
Eosinophils Relative: 3 %
HCT: 26.2 % — ABNORMAL LOW (ref 36.0–46.0)
Hemoglobin: 7.8 g/dL — ABNORMAL LOW (ref 12.0–15.0)
Immature Granulocytes: 0 %
Lymphocytes Relative: 17 %
Lymphs Abs: 1.5 10*3/uL (ref 0.7–4.0)
MCH: 25.8 pg — ABNORMAL LOW (ref 26.0–34.0)
MCHC: 29.8 g/dL — ABNORMAL LOW (ref 30.0–36.0)
MCV: 86.8 fL (ref 80.0–100.0)
Monocytes Absolute: 1 10*3/uL (ref 0.1–1.0)
Monocytes Relative: 12 %
Neutro Abs: 5.8 10*3/uL (ref 1.7–7.7)
Neutrophils Relative %: 67 %
Platelets: 629 10*3/uL — ABNORMAL HIGH (ref 150–400)
RBC: 3.02 MIL/uL — ABNORMAL LOW (ref 3.87–5.11)
RDW: 23.2 % — ABNORMAL HIGH (ref 11.5–15.5)
WBC: 8.6 10*3/uL (ref 4.0–10.5)
nRBC: 0 % (ref 0.0–0.2)

## 2018-07-30 LAB — BASIC METABOLIC PANEL
Anion gap: 10 (ref 5–15)
BUN: 43 mg/dL — ABNORMAL HIGH (ref 8–23)
CO2: 27 mmol/L (ref 22–32)
Calcium: 8.2 mg/dL — ABNORMAL LOW (ref 8.9–10.3)
Chloride: 107 mmol/L (ref 98–111)
Creatinine, Ser: 0.86 mg/dL (ref 0.44–1.00)
GFR calc Af Amer: >60 mL/min (ref >60)
GFR calc non Af Amer: 58 mL/min — ABNORMAL LOW (ref >60)
Glucose, Bld: 139 mg/dL — ABNORMAL HIGH (ref 70–99)
Potassium: 3 mmol/L — ABNORMAL LOW (ref 3.5–5.1)
Sodium: 144 mmol/L (ref 135–145)

## 2018-07-30 LAB — HEMOGLOBIN AND HEMATOCRIT, BLOOD
HCT: 26.3 % — ABNORMAL LOW (ref 36.0–46.0)
HCT: 26.1 % — ABNORMAL LOW (ref 36.0–46.0)
HCT: 28.1 % — ABNORMAL LOW (ref 36.0–46.0)
Hemoglobin: 8 g/dL — ABNORMAL LOW (ref 12.0–15.0)
Hemoglobin: 7.9 g/dL — ABNORMAL LOW (ref 12.0–15.0)
Hemoglobin: 8.2 g/dL — ABNORMAL LOW (ref 12.0–15.0)

## 2018-07-30 LAB — GLUCOSE, CAPILLARY
Glucose-Capillary: 116 mg/dL — ABNORMAL HIGH (ref 70–99)
Glucose-Capillary: 153 mg/dL — ABNORMAL HIGH (ref 70–99)
Glucose-Capillary: 66 mg/dL — ABNORMAL LOW (ref 70–99)
Glucose-Capillary: 120 mg/dL — ABNORMAL HIGH (ref 70–99)
Glucose-Capillary: 83 mg/dL (ref 70–99)

## 2018-07-30 NOTE — Progress Notes (Signed)
Pt stable at prsent time, no nose bleeding, packing inplace. Denies pain. SRP, RN

## 2018-07-30 NOTE — Progress Notes (Signed)
Subjective: Resting comfortably in bed. Had 2 episodes of bleeding earlier tonight.  Objective: Vital signs in last 24 hours: Temp:  [97.7 F (36.5 C)-98.2 F (36.8 C)] 98.2 F (36.8 C) (07/22 2009) Pulse Rate:  [73-83] 82 (07/22 2009) Resp:  [17-20] 17 (07/22 2009) BP: (115-122)/(64-74) 118/74 (07/22 2009) SpO2:  [92 %-96 %] 92 % (07/22 2009) Weight:  [57.6 kg] 57.6 kg (07/22 0504)  Physical Exam: General: NAD, resting in bed. Eyes: Pupils are equal, round, reactive to light. Extraocular motion is intact.  Ears: Examination of the ears shows normal auricles and external auditory canals bilaterally.  Nose: Right merocel packing in place. No active bleeding on exam. Face: Facial examination shows no asymmetry.  Mouth: Oral cavity examination shows no normal mucosa. Neck: No lymphadenopathy or mass. The trachea is midline. The thyroid is not significantly enlarged.  Respiratory:Normal respiratory effort. No accessory muscle use.  Cardiovascular:Regular rate and rhythm. Skin:no rashes, lesions, ulcers. No induration Neurologic:CN 2-12 grossly intact.    Recent Labs    07/28/18 1142  07/29/18 1226 07/29/18 1834  WBC 9.4  --   --   --   HGB 10.9*   < > 9.2* 8.9*  HCT 36.2   < > 30.5* 29.3*  PLT 712*  --   --   --    < > = values in this interval not displayed.   Recent Labs    07/29/18 0259 07/29/18 1834  NA 143 144  K 3.7 3.2*  CL 107 106  CO2 25 26  GLUCOSE 117* 149*  BUN 53* 49*  CREATININE 0.78 0.96  CALCIUM 7.8* 8.4*    Medications:  I have reviewed the patient's current medications. Scheduled: . albuterol  2.5 mg Nebulization BID  . amLODipine  10 mg Oral Daily   And  . irbesartan  300 mg Oral Daily  . carvedilol  12.5 mg Oral BID  . clindamycin  300 mg Oral Q8H  . digoxin  0.125 mg Oral Daily  . enoxaparin (LOVENOX) injection  40 mg Subcutaneous Q24H  . furosemide  40 mg Intravenous BID  . insulin aspart  0-15 Units Subcutaneous TID WC  .  insulin aspart  0-5 Units Subcutaneous QHS  . pantoprazole  40 mg Oral Daily  . rosuvastatin  10 mg Oral Daily  . sodium chloride flush  3 mL Intravenous Q12H   Continuous: . sodium chloride      Assessment/Plan: Right epistaxis, s/p AP merocel packing. - Will leave packing in place until Monday. - Continue Clindamycin - Will follow.   LOS: 1 day   Chandler Stofer W Brion Sossamon 07/30/2018, 12:59 AM

## 2018-07-30 NOTE — Progress Notes (Signed)
PROGRESS NOTE  Mariah Aguilar UXN:[id number] DOB: July 28, 1922 DOA: 07/28/2018 PCP: Glendale Chard, MD  Brief History    Mariah Aguilar is a 85 y.o. female with medical history significant of diastolic congestive heart failure, hypertension, hyperlipidemia and couple other comorbidities as below presented to ED with a complaint of nosebleed.  According to patient, she works out every day early in the morning and that is what she did this morning and while she finished that at around 6:30 AM, she noticed that she started having bleeding from her right side of the nose.  She called EMS, she was treated with Afrin which stopped her nose bleeding and EMS left however her bleeding returned within few minutes and she called EMS again and eventually she was brought into the emergency department.  According to her, she was still bleeding from the right nose when she arrived here.  She does not have any other complaints such as shortness of breath, fever, chills, sweating, chest pain, nausea, vomiting, any problem with urination or with bowel movement.  No sick contact or recent travel.  ED Course: Upon arrival to the ED, she was hemodynamically stable and saturating 94% on room air.  Her nose was packed and her bleeding stopped.  While ED staff walked her in order to get her ready for discharge, she felt dizzy and oxygen saturation dropped to 80% on room air requiring 2 L of oxygen.  Further work-up included chest x-ray which showed cardiomegaly and congestive heart failure.  Elevated BNP of 923.  Normal CBC with baseline anemia and hemoglobin of 10.9.  Almost unremarkable CMP.  COVID-19 is still pending.  She was given a dose of Lasix and hospital service was consulted for admission.  Initially when I saw her, patient was very reluctant to be admitted and wanted to go home.  After a lot of counseling and convincing, she decided to talk to her granddaughter who convinced her to stay.  ENT was consulted and  placed packing in her left nare. Her hemoglobin dropped more than a gram overnight. She had 2-3 bleeding incidents in the last 24 hours. The packing accidentally fell out this morning. The patient denies any further bleeding.  Consultants  . ENT  Procedures  . Right nare packed   Antibiotics   Anti-infectives (From admission, onward)   Start     Dose/Rate Route Frequency Ordered Stop   07/29/18 0600  clindamycin (CLEOCIN) capsule 300 mg     300 mg Oral Every 8 hours 07/29/18 0309        Subjective  The patient is resting quietly. No new complaints.  Objective   Vitals:  Vitals:   07/30/18 0811 07/30/18 1553  BP:  126/84  Pulse:  76  Resp:  16  Temp:  97.7 F (36.5 C)  SpO2: 94%     Exam:  Constitutional:  . The patient is awake, alert, and oriented x 3. No acute distress. ENMT:  . Right nare is no longer packed, and appears to have a small amount of bleed coming out of it. Marland Kitchen  Respiratory:  . No increased work of breathing. . No wheezes, rales, or rhonchi. . No tactile fremitus. Cardiovascular:  . Regular rate and rhythm . No murmurs, ectopy, or gallups . Normal pedal pulses Abdomen:  . Abdomen appears normal; no tenderness or masses . No hernias, masses, or organomegaly are appreciated. . Normoactive bowel sounds.  Musculoskeletal:  . No cyanosis, clubbing, or edema. Skin:  . No  rashes, lesions, ulcers . palpation of skin: no induration or nodules Neurologic:  . CN 2-12 intact . Sensation all 4 extremities intact   I have personally reviewed the following:   Today's Data  . CBC, BMP, Vitals  Cardiology Data  . EKG: Left bundle branch block. Unchanged from previous . Echocardiogram: EF 20-25%. Dilated LV. Positive for diastolic dysfunction. Paradoxical septal motion consistent with left bundle branch block.    Scheduled Meds: . albuterol  2.5 mg Nebulization BID  . amLODipine  10 mg Oral Daily   And  . irbesartan  300 mg Oral Daily  .  carvedilol  12.5 mg Oral BID  . clindamycin  300 mg Oral Q8H  . digoxin  0.125 mg Oral Daily  . enoxaparin (LOVENOX) injection  40 mg Subcutaneous Q24H  . furosemide  40 mg Intravenous BID  . insulin aspart  0-15 Units Subcutaneous TID WC  . insulin aspart  0-5 Units Subcutaneous QHS  . pantoprazole  40 mg Oral Daily  . rosuvastatin  10 mg Oral Daily  . sodium chloride flush  3 mL Intravenous Q12H   Continuous Infusions: . sodium chloride      Active Problems:   Type 2 diabetes mellitus with stage 2 chronic kidney disease, without long-term current use of insulin (HCC)   HLD (hyperlipidemia)   HTN (hypertension)   Acute on chronic diastolic (congestive) heart failure (HCC)   GERD (gastroesophageal reflux disease)   LOS: 1 day   Epistaxis: Recurrent today. Packing placed by ENT. To remain in place x 5 days. Pt is receiving PO cleocin. Monitor hemoglobin and transfuse for less than 7.0 or symptomatic anemia.  Acute on chronic combined systolic and diastolic heart failure: EF 20-25%. Diastolic dysfunction. Paradoxical septal motion due to left bundle branch block. Monitor voluem status and caution with IV fluids. Blood pressures are marginal, and I doubt that the patient can tolerate ACE I. She is on metoprolol and digoxin. She is also receiving IV lasix. I will convert this to oral lasix in preparation for dc tomorrow.  Hypertension: Blood pressures in the 110's on metoprolol. Monitor. Cannot tolerate ACE I at least with diuresis on lasix. May try to add ace when can convert back to oral diuresis.  Hyperlipidemia: Continue crestor.  DM II: Glucoses will be followed by FSBS and SSI.   I have seen and examined this patient myself. I have spent 48 minutes in her evaluation and care. I have discussed the patient in detail with her grandaughter Ulla Gallo. All questions answered to the best of my ability.  DVT Prophylaxis: SCD's CODE STATUS: Full code Family Communication: None  available Disposition: Home

## 2018-07-30 NOTE — Plan of Care (Signed)
  Problem: Education: Goal: Knowledge of General Education information will improve Description: Including pain rating scale, medication(s)/side effects and non-pharmacologic comfort measures Outcome: Progressing   Problem: Health Behavior/Discharge Planning: Goal: Ability to manage health-related needs will improve Outcome: Progressing   Problem: Clinical Measurements: Goal: Will remain free from infection Outcome: Progressing Goal: Diagnostic test results will improve Outcome: Progressing Goal: Respiratory complications will improve Outcome: Progressing   Problem: Education: Goal: Knowledge of General Education information will improve Description: Including pain rating scale, medication(s)/side effects and non-pharmacologic comfort measures Outcome: Progressing   Problem: Health Behavior/Discharge Planning: Goal: Ability to manage health-related needs will improve Outcome: Progressing   Problem: Clinical Measurements: Goal: Will remain free from infection Outcome: Progressing Goal: Diagnostic test results will improve Outcome: Progressing Goal: Respiratory complications will improve Outcome: Progressing

## 2018-07-30 NOTE — Progress Notes (Signed)
Spoke with pt concerning HH. Pt states that she will not need HH. She continued saying that she have a GYM at home and workout every morning at 5AM.

## 2018-07-30 NOTE — Telephone Encounter (Signed)
Left voicemail to confirm appt and verify info. °

## 2018-07-31 ENCOUNTER — Inpatient Hospital Stay: Payer: Medicare Other | Admitting: Internal Medicine

## 2018-07-31 DIAGNOSIS — R040 Epistaxis: Secondary | ICD-10-CM

## 2018-07-31 LAB — GLUCOSE, CAPILLARY
Glucose-Capillary: 119 mg/dL — ABNORMAL HIGH (ref 70–99)
Glucose-Capillary: 172 mg/dL — ABNORMAL HIGH (ref 70–99)

## 2018-07-31 LAB — BASIC METABOLIC PANEL
Anion gap: 11 (ref 5–15)
BUN: 35 mg/dL — ABNORMAL HIGH (ref 8–23)
CO2: 26 mmol/L (ref 22–32)
Calcium: 7.8 mg/dL — ABNORMAL LOW (ref 8.9–10.3)
Chloride: 106 mmol/L (ref 98–111)
Creatinine, Ser: 0.97 mg/dL (ref 0.44–1.00)
GFR calc Af Amer: 58 mL/min — ABNORMAL LOW (ref >60)
GFR calc non Af Amer: 50 mL/min — ABNORMAL LOW (ref >60)
Glucose, Bld: 115 mg/dL — ABNORMAL HIGH (ref 70–99)
Potassium: 2.9 mmol/L — ABNORMAL LOW (ref 3.5–5.1)
Sodium: 143 mmol/L (ref 135–145)

## 2018-07-31 LAB — CBC WITH DIFFERENTIAL/PLATELET
Abs Immature Granulocytes: 0.02 10*3/uL (ref 0.00–0.07)
Basophils Absolute: 0.1 10*3/uL (ref 0.0–0.1)
Basophils Relative: 1 %
Eosinophils Absolute: 0.4 10*3/uL (ref 0.0–0.5)
Eosinophils Relative: 5 %
HCT: 24.1 % — ABNORMAL LOW (ref 36.0–46.0)
Hemoglobin: 7.3 g/dL — ABNORMAL LOW (ref 12.0–15.0)
Immature Granulocytes: 0 %
Lymphocytes Relative: 22 %
Lymphs Abs: 1.5 10*3/uL (ref 0.7–4.0)
MCH: 26.3 pg (ref 26.0–34.0)
MCHC: 30.3 g/dL (ref 30.0–36.0)
MCV: 86.7 fL (ref 80.0–100.0)
Monocytes Absolute: 0.9 10*3/uL (ref 0.1–1.0)
Monocytes Relative: 13 %
Neutro Abs: 4.1 10*3/uL (ref 1.7–7.7)
Neutrophils Relative %: 59 %
Platelets: 553 10*3/uL — ABNORMAL HIGH (ref 150–400)
RBC: 2.78 MIL/uL — ABNORMAL LOW (ref 3.87–5.11)
RDW: 23 % — ABNORMAL HIGH (ref 11.5–15.5)
WBC: 6.9 10*3/uL (ref 4.0–10.5)
nRBC: 0 % (ref 0.0–0.2)

## 2018-07-31 LAB — PREPARE RBC (CROSSMATCH)

## 2018-07-31 MED ORDER — GUAIFENESIN ER 600 MG PO TB12: 600.0000 mg | ORAL_TABLET | Freq: Two times a day (BID) | ORAL | Status: DC

## 2018-07-31 MED ORDER — CLINDAMYCIN HCL 300 MG PO CAPS: 300.0000 mg | ORAL_CAPSULE | Freq: Three times a day (TID) | ORAL | 0 refills | Status: AC

## 2018-07-31 MED ORDER — GUAIFENESIN ER 600 MG PO TB12: 600.0000 mg | ORAL_TABLET | Freq: Two times a day (BID) | ORAL | 0 refills | Status: DC

## 2018-07-31 MED ORDER — SODIUM CHLORIDE 0.9% IV SOLUTION
Freq: Once | INTRAVENOUS | Status: AC
  Administered 2018-07-31: 12:00:00 via INTRAVENOUS

## 2018-07-31 NOTE — Evaluation (Signed)
Physical Therapy One Time Evaluation Patient Details Name: Mariah Aguilar MRN: [id number] DOB: [birth year] Today's Date: 07/31/2018   History of Present Illness  85 y.o. female with medical history significant of diastolic congestive heart failure, hypertension, hyperlipidemia and admitted for epistaxis  Clinical Impression  Patient evaluated by Physical Therapy with no further acute PT needs identified. All education has been completed and the patient has no further questions. Pt assisted with ambulating in hallway and reports she is mobilizing at baseline.  Pt typically furniture walks at home and states her granddaughter can assist her if needed.  Pt declines using any assistive device, also states she will not use at home.  Pt educated not to perform her daily exercises until cleared by MD (works out every morning). See below for any follow-up Physical Therapy or equipment needs. PT is signing off. Thank you for this referral.     Follow Up Recommendations No PT follow up    Equipment Recommendations  None recommended by PT(would benefit from RW however pt refuses to use any assistive device)    Recommendations for Other Services       Precautions / Restrictions Precautions Precautions: Fall      Mobility  Bed Mobility               General bed mobility comments: pt up in recliner on arrival  Transfers Overall transfer level: Needs assistance Equipment used: None Transfers: Sit to/from Stand Sit to Stand: Min guard         General transfer comment: min/guard for safety, requires UE assist  Ambulation/Gait Ambulation/Gait assistance: Min guard Gait Distance (Feet): 120 Feet Assistive device: 1 person hand held assist(handrail) Gait Pattern/deviations: Step-through pattern;Decreased stride length Gait velocity: decr   General Gait Details: verbal cues for UE self assist, pt declines using any assistive device (will not use at home either); provided HHA and  pt utilized hand rail, she reports furniture walking at home  Stairs            Wheelchair Mobility    Modified Rankin (Stroke Patients Only)       Balance Overall balance assessment: Needs assistance         Standing balance support: Single extremity supported Standing balance-Leahy Scale: Poor Standing balance comment: requires UE support, denies any falls                             Pertinent Vitals/Pain Pain Assessment: No/denies pain    Home Living Family/patient expects to be discharged to:: Private residence Living Arrangements: Other relatives(granddaughter) Available Help at Discharge: Family Type of Home: House       Home Layout: One level Home Equipment: None      Prior Function Level of Independence: Independent               Hand Dominance        Extremity/Trunk Assessment        Lower Extremity Assessment Lower Extremity Assessment: Generalized weakness(wears knee brace for arthritis)       Communication   Communication: No difficulties  Cognition Arousal/Alertness: Awake/alert Behavior During Therapy: WFL for tasks assessed/performed Overall Cognitive Status: Within Functional Limits for tasks assessed                                        General Comments  Exercises     Assessment/Plan    PT Assessment Patent does not need any further PT services  PT Problem List         PT Treatment Interventions      PT Goals (Current goals can be found in the Care Plan section)  Acute Rehab PT Goals PT Goal Formulation: All assessment and education complete, DC therapy    Frequency     Barriers to discharge        Co-evaluation               AM-PAC PT "6 Clicks" Mobility  Outcome Measure Help needed turning from your back to your side while in a flat bed without using bedrails?: None Help needed moving from lying on your back to sitting on the side of a flat bed without  using bedrails?: A Little Help needed moving to and from a bed to a chair (including a wheelchair)?: A Little Help needed standing up from a chair using your arms (e.g., wheelchair or bedside chair)?: A Little Help needed to walk in hospital room?: A Little Help needed climbing 3-5 steps with a railing? : A Little 6 Click Score: 19    End of Session   Activity Tolerance: Patient tolerated treatment well Patient left: in chair;with call bell/phone within reach;with nursing/sitter in room Nurse Communication: Mobility status      Time: 5520-8022 PT Time Calculation (min) (ACUTE ONLY): 18 min   Charges:   PT Evaluation $PT Eval Low Complexity: [place], PT, DPT Acute Rehabilitation Services Office: [phone] Pager: [phone]  Trena Platt 07/31/2018, 12:48 PM

## 2018-07-31 NOTE — Discharge Summary (Addendum)
Physician Discharge Summary  Mariah Aguilar WNU:[id number] DOB: Apr 12, 1922 DOA: 07/28/2018  PCP: Glendale Chard, MD  Admit date: 07/28/2018 Discharge date: 07/31/2018  Recommendations for Outpatient Follow-up:  Patient has been instructed to engage in only quiet, non-exertional activities for one week. She is to return to the ED should her nose begin to bleed heavily again. She is to follow up with ENT as directed. She is to follow up with her PCP in 7-10 days.    Discharge Diagnoses: Principal diagnosis is #1 Epistaxis Post hemorrhagic anemia with drop in hemoglobin top 7.3 was due to Epistaxis.  The medication you are ordering is associated with Risk Evaluation and Mitigation Strategies (REMS) and has Elements to [employer] (ETASU).  [email or web address]  Acute on chronic combined systolic and diastolic congestive heart failure.  Discharge Condition: Fair Disposition: Home  Diet recommendation: Heart healthy  Filed Weights   07/29/18 0504 07/30/18 0500 07/31/18 0600  Weight: 57.6 kg 55.4 kg 53.9 kg    History of present illness:  Mariah Aguilar is a 85 y.o. female with medical history significant of diastolic congestive heart failure, hypertension, hyperlipidemia and couple other comorbidities as below presented to ED with a complaint of nosebleed.  According to patient, she works out every day early in the morning and that is what she did this morning and while she finished that at around 6:30 AM, she noticed that she started having bleeding from her right side of the nose.  She called EMS, she was treated with Afrin which stopped her nose bleeding and EMS left however her bleeding returned within few minutes and she called EMS again and eventually she was brought into the emergency department.  According to her, she was still bleeding from the right nose when she arrived here.  She does not have any other complaints such as shortness  of breath, fever, chills, sweating, chest pain, nausea, vomiting, any problem with urination or with bowel movement.  No sick contact or recent travel.  Hospital Course:  ENT was consulted and merocel packing was put in place. Her hemoglobin was carefully monitored. The packing fell out on the morning of 7.23.2020. This morning the patient's hemoglobin had dropped from 9.2 on admission to 7.3. She was transfused with one unit. According to nursing, the patient has had very little bleeding in the past day.  The patient also had an echocardiogram that demonstrated EF 20-25%. Diastolic dysfunction. Paradoxical septal motion due to left bundle branch block. She was diuresed and will be discharged to home with lasix and instruction to monitor her weights.  Today's assessment: S: The patient is sitting up at bedside. No new complaints. O: Vitals:  Vitals:   07/31/18 1332 07/31/18 1610  BP: (!) 100/57 112/68  Pulse: 70 77  Resp: 18 18  Temp: 98.3 F (36.8 C) 98.1 F (36.7 C)  SpO2: 100% 100%    Constitutional:  The patient is awake, alert, and oriented x 3. No acute distress. ENMT:  Right nare is no longer packed, and appears to have a small amount of bleed coming out of it.   Respiratory:  No increased work of breathing. No wheezes, rales, or rhonchi. No tactile fremitus. Cardiovascular:  Regular rate and rhythm No murmurs, ectopy, or gallups Normal pedal pulses Abdomen:  Abdomen appears normal; no tenderness or masses No hernias, masses, or organomegaly are appreciated. Normoactive bowel sounds.  Musculoskeletal:  No cyanosis, clubbing, or edema. Skin:  No rashes, lesions, ulcers palpation of skin: no  induration or nodules Neurologic:  CN 2-12 intact Sensation all 4 extremities intact   Discharge Instructions  Discharge Instructions     Activity as tolerated - No restrictions   Complete by: As directed    Call MD for:  persistant nausea and vomiting   Complete by: As  directed    Call MD for:  severe uncontrolled pain   Complete by: As directed    Diet - low sodium heart healthy   Complete by: As directed    Discharge instructions   Complete by: As directed    Light and quiet activity only for the next week. Follow up with ENT in one week. Follow up with PCP in 7-10 days. Return to ED for heaving bleeding from nose.   Increase activity slowly   Complete by: As directed       Allergies as of 07/31/2018       Reactions   Shrimp [shellfish Allergy] Itching, Swelling   Penicillins Hives   Did it involve swelling of the face/tongue/throat, SOB, or low BP? Unknown Did it involve sudden or severe rash/hives, skin peeling, or any reaction on the inside of your mouth or nose? Unknown Did you need to seek medical attention at a hospital or doctor's office? Unknown When did it last happen?       If all above answers are NO, may proceed with cephalosporin use.        Medication List     TAKE these medications    albuterol 108 (90 Base) MCG/ACT inhaler Commonly known as: VENTOLIN HFA Inhale 2 puffs into the lungs every 6 (six) hours as needed for wheezing or shortness of breath.   amLODipine-valsartan 10-320 MG tablet Commonly known as: EXFORGE Take 1 tablet by mouth daily.   benzonatate 100 MG capsule Commonly known as: Tessalon Perles Take 1 capsule (100 mg total) by mouth 3 (three) times daily as needed for cough.   carvedilol 12.5 MG tablet Commonly known as: COREG Take 12.5 mg by mouth 2 (two) times daily with a meal.   clindamycin 300 MG capsule Commonly known as: CLEOCIN Take 1 capsule (300 mg total) by mouth every 8 (eight) hours for 3 days.   Dexilant 60 MG capsule Generic drug: dexlansoprazole TAKE 1 CAPSULE BY MOUTH EVERY DAY What changed: how much to take   diazepam 5 MG tablet Commonly known as: VALIUM Take 2.5 mg by mouth at bedtime as needed for sedation.   digoxin 0.125 MG tablet Commonly known as:  LANOXIN Take 0.125 mg by mouth daily.   furosemide 40 MG tablet Commonly known as: LASIX Take 40 mg by mouth daily.   guaiFENesin 600 MG 12 hr tablet Commonly known as: MUCINEX Take 1 tablet (600 mg total) by mouth 2 (two) times daily.   NIFEREX-150 FORTE PO Take 150 mg by mouth daily.   oxymetazoline 0.05 % nasal spray Commonly known as: AFRIN Place 1 spray into both nostrils 2 (two) times daily as needed (nose bleeds).   potassium chloride 8 MEQ CR tablet Commonly known as: KLOR-CON Take 8 mEq by mouth daily.   RESTORA PO Take 1 tablet by mouth daily.   rosuvastatin 10 MG tablet Commonly known as: CRESTOR Take 10 mg by mouth daily.   traMADol 50 MG tablet Commonly known as: ULTRAM Take 50 mg by mouth as needed for moderate pain.       Allergies  Allergen Reactions   Shrimp [Shellfish Allergy] Itching and Swelling   Penicillins Hives  Did it involve swelling of the face/tongue/throat, SOB, or low BP? Unknown Did it involve sudden or severe rash/hives, skin peeling, or any reaction on the inside of your mouth or nose? Unknown Did you need to seek medical attention at a hospital or doctor's office? Unknown When did it last happen?       If all above answers are NO, may proceed with cephalosporin use.    The results of significant diagnostics from this hospitalization (including imaging, microbiology, ancillary and laboratory) are listed below for reference.    Significant Diagnostic Studies: Dg Chest 2 View  Result Date: 07/28/2018 CLINICAL DATA:  Nosebleed, dizziness, hypertension. History of CHF. EXAM: CHEST - 2 VIEW COMPARISON:  CT abdomen dated 03/05/2018. Chest x-ray dated 05/14/2010. Chest CT dated 10/16/2015. FINDINGS: Cardiomegaly. The cardiomegaly is increased compared to the previous chest x-ray of 05/14/2010, similar size compared to the interval chest CT and CT abdomen. Subtle perihilar opacities, most likely mild interstitial edema. No pleural  effusion or pneumothorax seen. No acute or suspicious osseous finding. Degenerative spurring throughout the kyphotic thoracolumbar spine. IMPRESSION: Cardiomegaly with mild bilateral interstitial edema suggesting mild CHF/volume overload, suspect chronic mild CHF. Electronically Signed   By: Franki Cabot M.D.   On: 07/28/2018 11:45    Microbiology: Recent Results (from the past 240 hour(s))  SARS Coronavirus 2 (CEPHEID - Performed in [place] hospital lab), Hosp Order     Status: None   Collection Time: 07/28/18  2:52 PM   Specimen: Nasopharyngeal Swab  Result Value Ref Range Status   SARS Coronavirus 2 NEGATIVE NEGATIVE Final    Comment: (NOTE) If result is NEGATIVE SARS-CoV-2 target nucleic acids are NOT DETECTED. The SARS-CoV-2 RNA is generally detectable in upper and lower  respiratory specimens during the acute phase of infection. The lowest  concentration of SARS-CoV-2 viral copies this assay can detect is 250  copies / mL. A negative result does not preclude SARS-CoV-2 infection  and should not be used as the sole basis for treatment or other  patient management decisions.  A negative result may occur with  improper specimen collection / handling, submission of specimen other  than nasopharyngeal swab, presence of viral mutation(s) within the  areas targeted by this assay, and inadequate number of viral copies  (<250 copies / mL). A negative result must be combined with clinical  observations, patient history, and epidemiological information. If result is POSITIVE SARS-CoV-2 target nucleic acids are DETECTED. The SARS-CoV-2 RNA is generally detectable in upper and lower  respiratory specimens dur ing the acute phase of infection.  Positive  results are indicative of active infection with SARS-CoV-2.  Clinical  correlation with patient history and other diagnostic information is  necessary to determine patient infection status.  Positive results do  not rule out bacterial  infection or co-infection with other viruses. If result is PRESUMPTIVE POSTIVE SARS-CoV-2 nucleic acids MAY BE PRESENT.   A presumptive positive result was obtained on the submitted specimen  and confirmed on repeat testing.  While 2019 novel coronavirus  (SARS-CoV-2) nucleic acids may be present in the submitted sample  additional confirmatory testing may be necessary for epidemiological  and / or clinical management purposes  to differentiate between  SARS-CoV-2 and other Sarbecovirus currently known to infect humans.  If clinically indicated additional testing with an alternate test  methodology [phone]) is advised. The SARS-CoV-2 RNA is generally  detectable in upper and lower respiratory sp ecimens during the acute  phase of infection. The expected  result is Negative. Fact Sheet for Patients:  StrictlyIdeas.no Fact Sheet for Healthcare Providers: BankingDealers.co.za This test is not yet approved or cleared by the [place] FDA and has been authorized for detection and/or diagnosis of SARS-CoV-2 by FDA under an Emergency Use Authorization (EUA).  This EUA will remain in effect (meaning this test can be used) for the duration of the COVID-19 declaration under Section 564(b)(1) of the Act, 21 U.S.C. section 360bbb-3(b)(1), unless the authorization is terminated or revoked sooner. Performed at [facility], [place] [address], [place], [place] [address]      Labs: Basic Metabolic Panel: Recent Labs  Lab 07/28/18 1142 07/29/18 0259 07/29/18 1834 07/30/18 0509 07/31/18 0411  NA 140 143 144 144 143  K 4.3 3.7 3.2* 3.0* 2.9*  CL 103 107 106 107 106  CO2 27 25 26 27 26   GLUCOSE 129* 117* 149* 139* 115*  BUN 25* 53* 49* 43* 35*  CREATININE 0.87 0.78 0.96 0.86 0.97  CALCIUM 8.1* 7.8* 8.4* 8.2* 7.8*   Liver Function Tests: No results for input(s): AST, ALT, ALKPHOS, BILITOT, PROT, ALBUMIN in the last 168  hours. No results for input(s): LIPASE, AMYLASE in the last 168 hours. No results for input(s): AMMONIA in the last 168 hours. CBC: Recent Labs  Lab 07/28/18 1142  07/29/18 1834 07/30/18 0509 07/30/18 1100 07/30/18 1848 07/31/18 0411  WBC 9.4  --   --  8.6  --   --  6.9  NEUTROABS 7.2  --   --  5.8  --   --  4.1  HGB 10.9*   < > 8.9* 7.8*   8.0* 7.9* 8.2* 7.3*  HCT 36.2   < > 29.3* 26.2*   26.3* 26.1* 28.1* 24.1*  MCV 86.6  --   --  86.8  --   --  86.7  PLT 712*  --   --  629*  --   --  553*   < > = values in this interval not displayed.   Cardiac Enzymes: No results for input(s): CKTOTAL, CKMB, CKMBINDEX, TROPONINI in the last 168 hours. BNP: BNP (last 3 results) Recent Labs    07/28/18 1141  BNP 923.0*    ProBNP (last 3 results) No results for input(s): PROBNP in the last 8760 hours.  CBG: Recent Labs  Lab 07/30/18 1704 07/30/18 1747 07/30/18 2207 07/31/18 0807 07/31/18 1131  GLUCAP 66* 120* 83 119* 172*    Active Problems:   Type 2 diabetes mellitus with stage 2 chronic kidney disease, without long-term current use of insulin (HCC)   HLD (hyperlipidemia)   HTN (hypertension)   Acute on chronic diastolic (congestive) heart failure (HCC)   GERD (gastroesophageal reflux disease)   Time coordinating discharge: 38 minutes.  Signed:        Rahim Astorga, DO [facility]  07/31/2018, 5:27 PM

## 2018-07-31 NOTE — Progress Notes (Signed)
Gauze dressing under nose changed. Minimal drainage.

## 2018-07-31 NOTE — Progress Notes (Signed)
D/c'ing to home w/ GD. Voices no c/o. All d/cinstructions given w/ verbal understanding.

## 2018-07-31 NOTE — Progress Notes (Signed)
Subjective: Sitting in chair. No more bleeding.  Objective: Vital signs in last 24 hours: Temp:  [97.5 F (36.4 C)-98.7 F (37.1 C)] 98.1 F (36.7 C) (07/24 1610) Pulse Rate:  [70-83] 77 (07/24 1610) Resp:  [18] 18 (07/24 1610) BP: (100-126)/(57-78) 112/68 (07/24 1610) SpO2:  [94 %-100 %] 100 % (07/24 1610) Weight:  [53.9 kg] 53.9 kg (07/24 0600)  Physical Exam: General: NAD, A&Ox3. Eyes:Pupils are equal, round, reactive to light. Extraocular motion is intact.  Ears: Examination of the ears shows normal auricles and external auditory canals bilaterally.  Nose:Right merocel packing in place. No active bleeding on exam. Face: Facial examination shows no asymmetry.  Mouth: Oral cavity examination shows no normal mucosa. Neck:No lymphadenopathy or mass. The trachea is midline. The thyroid is not significantly enlarged.  Respiratory:Normal respiratory effort. No accessory muscle use.  Cardiovascular:Regular rate and rhythm. Skin:no rashes, lesions, ulcers. No induration Neurologic:CN 2-12 grossly intact.   Recent Labs    07/30/18 0509  07/30/18 1848 07/31/18 0411  WBC 8.6  --   --  6.9  HGB 7.8*  8.0*   < > 8.2* 7.3*  HCT 26.2*  26.3*   < > 28.1* 24.1*  PLT 629*  --   --  553*   < > = values in this interval not displayed.   Recent Labs    07/30/18 0509 07/31/18 0411  NA 144 143  K 3.0* 2.9*  CL 107 106  CO2 27 26  GLUCOSE 139* 115*  BUN 43* 35*  CREATININE 0.86 0.97  CALCIUM 8.2* 7.8*    Medications:  I have reviewed the patient's current medications. Scheduled: . albuterol  2.5 mg Nebulization BID  . amLODipine  10 mg Oral Daily   And  . irbesartan  300 mg Oral Daily  . carvedilol  12.5 mg Oral BID  . clindamycin  300 mg Oral Q8H  . digoxin  0.125 mg Oral Daily  . enoxaparin (LOVENOX) injection  40 mg Subcutaneous Q24H  . furosemide  40 mg Intravenous BID  . guaiFENesin  600 mg Oral BID  . insulin aspart  0-15 Units Subcutaneous TID WC  .  insulin aspart  0-5 Units Subcutaneous QHS  . pantoprazole  40 mg Oral Daily  . rosuvastatin  10 mg Oral Daily  . sodium chloride flush  3 mL Intravenous Q12H   Continuous: . sodium chloride      Assessment/Plan: Right epistaxis, s/p AP merocel packing. - Will leave packing in place until early next week. - Continue oral Clindamycin - Pt instructed to call my office for an appt next week.   LOS: 2 days   Yovan Leeman W Geroldine Esquivias 07/31/2018, 5:09 PM

## 2018-07-31 NOTE — Care Management Important Message (Signed)
Important Message  Patient Details IM Letter given to Dessa Phi RN to present to the Patient Name: Mariah Aguilar MRN: [id number] Date of Birth: 07-04-1922   Medicare Important Message Given:  Yes     Kerin Salen 07/31/2018, 10:07 AM

## 2018-07-31 NOTE — Progress Notes (Signed)
Inpatient Diabetes Program Recommendations  AACE/ADA: New Consensus Statement on Inpatient Glycemic Control (2015)  Target Ranges:  Prepandial:   less than 140 mg/dL      Peak postprandial:   less than 180 mg/dL (1-2 hours)      Critically ill patients:  140 - 180 mg/dL   Results for Mariah Aguilar, Mariah Aguilar (MRN [id number]) as of 07/31/2018 09:00  Ref. Range 07/30/2018 08:36 07/30/2018 11:56 07/30/2018 17:04 07/30/2018 17:47 07/30/2018 22:07  Glucose-Capillary Latest Ref Range: 70 - 99 mg/dL 116 (H) 153 (H)  3 units NOVOLOG 66 (L) 120 (H) 83   Results for [name], [name] (MRN [id number]) as of 07/31/2018 09:00  Ref. Range 07/31/2018 08:07  Glucose-Capillary Latest Ref Range: 70 - 99 mg/dL 119 (H)   Results for [name], [name] (MRN [id number]) as of 07/31/2018 09:00  Ref. Range 05/19/2018 12:45  Hemoglobin A1C Latest Ref Range: 4.8 - 5.6 % 6.5 (H)   Admit with: CHF, Nosebleed  History: DM, CHF  Home DM Meds: None--Diet controlled  Current Orders: Novolog Moderate Correction Scale/ SSI (0-15 units) TID AC + HS     MD- Note patient had mild Hypoglycemic event yesterday at 5pm after receiving 3 units Novolog SSi at 12pm.  May consider stopping Novolog SSi for now but continue CBG checks      --Will follow patient during hospitalization--  Wyn Quaker RN, MSN, CDE Diabetes Coordinator Inpatient Glycemic Control Team Team Pager: [phone] (8a-5p)

## 2018-08-01 LAB — TYPE AND SCREEN
ABO/RH(D): O POS
Antibody Screen: NEGATIVE
Unit division: 0

## 2018-08-01 LAB — BPAM RBC
Blood Product Expiration Date: 202008192359
ISSUE DATE / TIME: 202007241306
Unit Type and Rh: 5100

## 2018-08-03 ENCOUNTER — Telehealth: Payer: Self-pay

## 2018-08-03 ENCOUNTER — Telehealth: Payer: Self-pay | Admitting: Internal Medicine

## 2018-08-03 NOTE — Telephone Encounter (Signed)
Called pt per 7/24 sch message - unable to reach pt . Left message for pt to call back for reschedule.

## 2018-08-03 NOTE — Telephone Encounter (Signed)
Called to do toc call. No answer. appt scheduled

## 2018-08-04 DIAGNOSIS — R040 Epistaxis: Secondary | ICD-10-CM | POA: Diagnosis not present

## 2018-08-05 ENCOUNTER — Telehealth: Payer: Self-pay

## 2018-08-05 NOTE — Telephone Encounter (Signed)
2nd attempt to do TCM call

## 2018-08-07 ENCOUNTER — Inpatient Hospital Stay: Payer: Medicare Other | Admitting: Internal Medicine

## 2018-08-10 LAB — JAK 2 V617F (GENPATH)

## 2018-08-10 LAB — JAK 2 EXON 12 (GENPATH)

## 2018-08-10 LAB — BCR ABL1 FISH (GENPATH)

## 2018-08-13 ENCOUNTER — Ambulatory Visit: Payer: Self-pay | Admitting: Internal Medicine

## 2018-08-13 ENCOUNTER — Telehealth: Payer: Self-pay | Admitting: Internal Medicine

## 2018-08-13 NOTE — Telephone Encounter (Signed)
CALLED PT TO SEE WHY APPT WAS MISSED TODAY PER PT SHE DID NOT WANT TO COME TO OFFICE DUE TO SHE WAS FEELING WEEK, ADV PT THAT NEEDED TO RESCHEDULE HFU W/ANOTHER PROVIDER DUE TO SANDERS IS OUT NEXT WEEK, PT DECLINED APPT, STTD SHE DID NOT WANT TO SEE ANOTHER PROVIDER, PT STATED SHE IS GOING TO WAIT TILL HER 9/16 APPT.PROVIDER WAS NOTIFIED

## 2018-08-19 ENCOUNTER — Telehealth: Payer: Self-pay | Admitting: Hematology

## 2018-08-19 NOTE — Telephone Encounter (Signed)
[place] transfer to Mariah Aguilar. Left message asking patient to call me directly regarding setting up appointment in office.

## 2018-08-20 ENCOUNTER — Ambulatory Visit: Payer: Medicare Other | Admitting: Nurse Practitioner

## 2018-08-23 ENCOUNTER — Other Ambulatory Visit: Payer: Self-pay | Admitting: Internal Medicine

## 2018-08-28 DIAGNOSIS — N8111 Cystocele, midline: Secondary | ICD-10-CM | POA: Diagnosis not present

## 2018-09-02 DIAGNOSIS — R040 Epistaxis: Secondary | ICD-10-CM | POA: Diagnosis not present

## 2018-09-16 ENCOUNTER — Telehealth: Payer: Self-pay | Admitting: Internal Medicine

## 2018-09-16 NOTE — Telephone Encounter (Signed)
I called the patient to reschedule her AWV, but was told that she's not in.  I'll try to call again later. VDM (DD)

## 2018-09-17 ENCOUNTER — Telehealth: Payer: Self-pay | Admitting: Internal Medicine

## 2018-09-17 NOTE — Telephone Encounter (Signed)
I left a message asking the patient to call me to reschedule AWV. VDM (DD) °

## 2018-09-18 ENCOUNTER — Telehealth: Payer: Self-pay | Admitting: Internal Medicine

## 2018-09-18 ENCOUNTER — Other Ambulatory Visit: Payer: Self-pay | Admitting: Internal Medicine

## 2018-09-18 NOTE — Telephone Encounter (Signed)
I left a message letting the patient know to come in on 09/23/2018 at 11:30 for her appointment with Dr. Baird Cancer. (will reschedule AWV later since I haven't been able to speak with the patient). VDM (DD)

## 2018-09-22 ENCOUNTER — Telehealth: Payer: Self-pay | Admitting: Internal Medicine

## 2018-09-22 NOTE — Chronic Care Management (AMB) (Signed)
Chronic Care Management   Note  09/22/2018 Name: REMMY CRASS MRN: [id number] DOB: 1922/12/29  Morayma M Godar is a 85 y.o. year old female who is a primary care patient of Glendale Chard, MD. I reached out to [place] by phone today in response to a referral sent by Ms. Kaylan M Mcleish's health plan.    Ms. Setzer was given information about Chronic Care Management services today including:  1. CCM service includes personalized support from designated clinical staff supervised by her physician, including individualized plan of care and coordination with other care providers 2. 24/7 contact phone numbers for assistance for urgent and routine care needs. 3. Service will only be billed when office clinical staff spend 20 minutes or more in a month to coordinate care. 4. Only one practitioner may furnish and bill the service in a calendar month. 5. The patient may stop CCM services at any time (effective at the end of the month) by phone call to the office staff. 6. The patient will be responsible for cost sharing (co-pay) of up to 20% of the service fee (after annual deductible is met).  Patient did not agree to enrollment in care management services and does not wish to consider at this time.  Follow up plan: The patient has been provided with contact information for the chronic care management team and has been advised to call with any health related questions or concerns.   [place]  ??[email or web address]   ??[phone]

## 2018-09-23 ENCOUNTER — Ambulatory Visit: Payer: Medicare Other

## 2018-09-23 ENCOUNTER — Ambulatory Visit: Payer: Medicare Other | Admitting: Internal Medicine

## 2018-09-23 DIAGNOSIS — H353211 Exudative age-related macular degeneration, right eye, with active choroidal neovascularization: Secondary | ICD-10-CM | POA: Diagnosis not present

## 2018-09-23 DIAGNOSIS — H353123 Nonexudative age-related macular degeneration, left eye, advanced atrophic without subfoveal involvement: Secondary | ICD-10-CM | POA: Diagnosis not present

## 2018-09-23 DIAGNOSIS — H35359 Cystoid macular degeneration, unspecified eye: Secondary | ICD-10-CM | POA: Diagnosis not present

## 2018-09-23 DIAGNOSIS — H353113 Nonexudative age-related macular degeneration, right eye, advanced atrophic without subfoveal involvement: Secondary | ICD-10-CM | POA: Diagnosis not present

## 2018-09-23 DIAGNOSIS — H353221 Exudative age-related macular degeneration, left eye, with active choroidal neovascularization: Secondary | ICD-10-CM | POA: Diagnosis not present

## 2018-09-29 ENCOUNTER — Ambulatory Visit: Payer: Medicare Other | Admitting: Internal Medicine

## 2018-10-08 ENCOUNTER — Other Ambulatory Visit: Payer: Self-pay

## 2018-10-08 ENCOUNTER — Ambulatory Visit (INDEPENDENT_AMBULATORY_CARE_PROVIDER_SITE_OTHER): Payer: Medicare Other | Admitting: Internal Medicine

## 2018-10-08 ENCOUNTER — Encounter: Payer: Self-pay | Admitting: Internal Medicine

## 2018-10-08 VITALS — BP 130/68 | HR 64 | Temp 98.0°F | Ht 63.0 in | Wt 131.2 lb

## 2018-10-08 DIAGNOSIS — D508 Other iron deficiency anemias: Secondary | ICD-10-CM | POA: Diagnosis not present

## 2018-10-08 DIAGNOSIS — Z23 Encounter for immunization: Secondary | ICD-10-CM | POA: Diagnosis not present

## 2018-10-08 DIAGNOSIS — I5189 Other ill-defined heart diseases: Secondary | ICD-10-CM | POA: Diagnosis not present

## 2018-10-08 DIAGNOSIS — R062 Wheezing: Secondary | ICD-10-CM | POA: Diagnosis not present

## 2018-10-08 DIAGNOSIS — E1122 Type 2 diabetes mellitus with diabetic chronic kidney disease: Secondary | ICD-10-CM

## 2018-10-08 DIAGNOSIS — I130 Hypertensive heart and chronic kidney disease with heart failure and stage 1 through stage 4 chronic kidney disease, or unspecified chronic kidney disease: Secondary | ICD-10-CM

## 2018-10-08 DIAGNOSIS — N182 Chronic kidney disease, stage 2 (mild): Secondary | ICD-10-CM | POA: Diagnosis not present

## 2018-10-08 MED ORDER — AEROCHAMBER PLUS MISC: 2 refills | Status: AC

## 2018-10-09 LAB — CMP14+EGFR
ALT: 20 IU/L (ref 0–32)
AST: 19 IU/L (ref 0–40)
Albumin/Globulin Ratio: 1.4 (ref 1.2–2.2)
Albumin: 4.6 g/dL (ref 3.5–4.6)
Alkaline Phosphatase: 61 IU/L (ref 39–117)
BUN/Creatinine Ratio: 14 (ref 12–28)
BUN: 14 mg/dL (ref 10–36)
Bilirubin Total: 0.3 mg/dL (ref 0.0–1.2)
CO2: 27 mmol/L (ref 20–29)
Calcium: 9.2 mg/dL (ref 8.7–10.3)
Chloride: 100 mmol/L (ref 96–106)
Creatinine, Ser: 1.02 mg/dL — ABNORMAL HIGH (ref 0.57–1.00)
GFR calc Af Amer: 54 mL/min/{1.73_m2} — ABNORMAL LOW (ref >59)
GFR calc non Af Amer: 47 mL/min/{1.73_m2} — ABNORMAL LOW (ref >59)
Globulin, Total: 3.2 g/dL (ref 1.5–4.5)
Glucose: 138 mg/dL — ABNORMAL HIGH (ref 65–99)
Potassium: 5 mmol/L (ref 3.5–5.2)
Sodium: 139 mmol/L (ref 134–144)
Total Protein: 7.8 g/dL (ref 6.0–8.5)

## 2018-10-09 LAB — BRAIN NATRIURETIC PEPTIDE: BNP: 600.6 pg/mL — ABNORMAL HIGH (ref 0.0–100.0)

## 2018-10-09 LAB — CBC
Hematocrit: 39.7 % (ref 34.0–46.6)
Hemoglobin: 12.4 g/dL (ref 11.1–15.9)
MCH: 26.7 pg (ref 26.6–33.0)
MCHC: 31.2 g/dL — ABNORMAL LOW (ref 31.5–35.7)
MCV: 86 fL (ref 79–97)
Platelets: 759 10*3/uL — ABNORMAL HIGH (ref 150–450)
RBC: 4.64 x10E6/uL (ref 3.77–5.28)
RDW: 14.9 % (ref 11.7–15.4)
WBC: 7.4 10*3/uL (ref 3.4–10.8)

## 2018-10-09 LAB — IRON AND TIBC
Iron Saturation: 24 % (ref 15–55)
Iron: 88 ug/dL (ref 27–139)
Total Iron Binding Capacity: 366 ug/dL (ref 250–450)
UIBC: 278 ug/dL (ref 118–369)

## 2018-10-09 LAB — HEMOGLOBIN A1C
Est. average glucose Bld gHb Est-mCnc: 128 mg/dL
Hgb A1c MFr Bld: 6.1 % — ABNORMAL HIGH (ref 4.8–5.6)

## 2018-10-12 ENCOUNTER — Other Ambulatory Visit: Payer: Self-pay | Admitting: Internal Medicine

## 2018-10-15 ENCOUNTER — Other Ambulatory Visit: Payer: Self-pay

## 2018-10-15 MED ORDER — FUROSEMIDE 20 MG PO TABS: 20.0000 mg | ORAL_TABLET | Freq: Every day | ORAL | 11 refills | Status: DC

## 2018-10-28 DIAGNOSIS — N8111 Cystocele, midline: Secondary | ICD-10-CM | POA: Diagnosis not present

## 2018-11-01 DIAGNOSIS — I1310 Hypertensive heart and chronic kidney disease without heart failure, with stage 1 through stage 4 chronic kidney disease, or unspecified chronic kidney disease: Secondary | ICD-10-CM | POA: Insufficient documentation

## 2018-11-01 DIAGNOSIS — D649 Anemia, unspecified: Secondary | ICD-10-CM | POA: Insufficient documentation

## 2018-11-01 DIAGNOSIS — I5189 Other ill-defined heart diseases: Secondary | ICD-10-CM | POA: Insufficient documentation

## 2018-11-01 NOTE — Progress Notes (Signed)
Subjective:     Patient ID: Mariah Aguilar , female    DOB: 10-29-1922 , 85 y.o.   MRN: [id number]   Chief Complaint  Patient presents with  . Diabetes  . Hypertension    HPI  Diabetes She presents for her follow-up diabetic visit. She has type 2 diabetes mellitus. Her disease course has been stable. There are no hypoglycemic associated symptoms. Pertinent negatives for diabetes include no blurred vision and no chest pain. There are no hypoglycemic complications. Diabetic complications include nephropathy. Risk factors for coronary artery disease include diabetes mellitus, hypertension and post-menopausal. She is following a diabetic diet. She participates in exercise three times a week. An ACE inhibitor/angiotensin II receptor blocker is not being taken. Eye exam is current.  Hypertension This is a chronic problem. The current episode started more than 1 year ago. The problem has been gradually improving since onset. The problem is controlled. Pertinent negatives include no blurred vision or chest pain. Hypertensive end-organ damage includes kidney disease.     Past Medical History:  Diagnosis Date  . Arthritis   . Cardiomyopathy   . Cecal neoplasm   . CHF (congestive heart failure) ([place])   . Colon polyps   . Diverticulosis   . Hyperlipidemia   . Hypertension   . Insomnia   . Internal hemorrhoids   . Left bundle branch block (LBBB) on electrocardiogram    EKG of 5'12 shows 1st degree AV block/LBBB -Epic.     Family History  Problem Relation Age of Onset  . Cancer Mother        COLON  . Stroke Father   . Diabetes Sister      Current Outpatient Medications:  .  albuterol (VENTOLIN HFA) 108 (90 Base) MCG/ACT inhaler, Inhale 2 puffs into the lungs every 6 (six) hours as needed for wheezing or shortness of breath., Disp: 6.7 g, Rfl: 2 .  amLODipine-valsartan (EXFORGE) 10-320 MG per tablet, Take 1 tablet by mouth daily. , Disp: , Rfl:  .  benzonatate (TESSALON PERLES)  100 MG capsule, Take 1 capsule (100 mg total) by mouth 3 (three) times daily as needed for cough., Disp: 30 capsule, Rfl: 1 .  carvedilol (COREG) 12.5 MG tablet, Take 12.5 mg by mouth 2 (two) times daily with a meal. , Disp: , Rfl: 3 .  diazepam (VALIUM) 5 MG tablet, Take 2.5 mg by mouth at bedtime as needed for sedation. , Disp: , Rfl: 1 .  digoxin (LANOXIN) 0.125 MG tablet, Take 0.125 mg by mouth daily. , Disp: , Rfl: 3 .  furosemide (LASIX) 40 MG tablet, Take 40 mg by mouth daily. , Disp: , Rfl:  .  guaiFENesin (MUCINEX) 600 MG 12 hr tablet, Take 1 tablet (600 mg total) by mouth 2 (two) times daily., Disp: 20 tablet, Rfl: 0 .  Iron Polysacch Cmplx-B12-FA (NIFEREX-150 FORTE PO), Take 150 mg by mouth daily. , Disp: , Rfl:  .  oxymetazoline (AFRIN) 0.05 % nasal spray, Place 1 spray into both nostrils 2 (two) times daily as needed (nose bleeds)., Disp: , Rfl:  .  potassium chloride (KLOR-CON) 8 MEQ CR tablet, Take 8 mEq by mouth daily.  , Disp: , Rfl:  .  Probiotic Product (RESTORA PO), Take 1 tablet by mouth daily. , Disp: , Rfl:  .  rosuvastatin (CRESTOR) 10 MG tablet, Take 10 mg by mouth daily.  , Disp: , Rfl:  .  traMADol (ULTRAM) 50 MG tablet, Take 50 mg by mouth as needed  for moderate pain. , Disp: , Rfl: 0 .  DEXILANT 60 MG capsule, TAKE 1 CAPSULE BY MOUTH EVERY DAY, Disp: 30 capsule, Rfl: 1 .  furosemide (LASIX) 20 MG tablet, Take 1 tablet (20 mg total) by mouth daily. Take 1 tablet by mouth at lunch., Disp: 30 tablet, Rfl: 11 .  Spacer/Aero-Holding Chambers (AEROCHAMBER PLUS) inhaler, Use as instructed, Disp: 1 each, Rfl: 2   Allergies  Allergen Reactions  . Shrimp [Shellfish Allergy] Itching and Swelling  . Penicillins Hives    Did it involve swelling of the face/tongue/throat, SOB, or low BP? Unknown Did it involve sudden or severe rash/hives, skin peeling, or any reaction on the inside of your mouth or nose? Unknown Did you need to seek medical attention at a hospital or doctor's  office? Unknown When did it last happen? If all above answers are "NO", may proceed with cephalosporin use.     Review of Systems  Constitutional: Negative.   Eyes: Negative for blurred vision.  Respiratory: Positive for wheezing.        She admits to wheezing on occasion. Doesn't feel bad today. Denies recent cold.   Cardiovascular: Negative.  Negative for chest pain.  Gastrointestinal: Negative.   Neurological: Negative.   Psychiatric/Behavioral: Negative.      Today's Vitals   10/08/18 1419  BP: 130/68  Pulse: 64  Temp: 98 F (36.7 C)  TempSrc: Oral  SpO2: 97%  Weight: 131 lb 3.2 oz (59.5 kg)  Height: 5' 3"  (1.6 m)   Body mass index is 23.24 kg/m.   Objective:  Physical Exam Vitals signs and nursing note reviewed.  Constitutional:      Appearance: Normal appearance.  HENT:     Head: Normocephalic and atraumatic.  Cardiovascular:     Rate and Rhythm: Normal rate and regular rhythm.     Heart sounds: Normal heart sounds.  Pulmonary:     Effort: Pulmonary effort is normal.     Breath sounds: Normal breath sounds. No rales.  Skin:    General: Skin is warm.  Neurological:     General: No focal deficit present.     Mental Status: She is alert.  Psychiatric:        Mood and Affect: Mood normal.        Behavior: Behavior normal.         Assessment And Plan:     1. Type 2 diabetes mellitus with stage 2 chronic kidney disease, without long-term current use of insulin ([place])  I will check labs as listed below.  She is encouraged to avoid sugary beverages. She will continue to control this with diet/exercise.   - CMP14+EGFR - Hemoglobin A1c  2. Hypertensive heart and renal disease with renal failure, stage 1 through stage 4 or unspecified chronic kidney disease, with heart failure (HCC)  Chronic, controlled. She will continue with current meds. She is encouraged to avoid adding salt to her foods.   - Brain natriuretic peptide (35329)  3. Diastolic  dysfunction  Chronic. Patient advised that she could have wheezing due to acute exacerbation of heart failure. She is encouraged to come in when she is having symptoms.  Again, she is encouraged to follow a low-salt diet and to take meds as directed.   4. Other iron deficiency anemia  I will check iron studies today. She is encouraged to notify me if she has gingival or rectal bleeding.   - CBC no Diff - Iron and IBC ([id number])  5. Flu  vaccine need  She was given high dose flu vaccine to update her immunization history.   Maximino Greenland, MD    THE PATIENT IS ENCOURAGED TO PRACTICE SOCIAL DISTANCING DUE TO THE COVID-19 PANDEMIC.

## 2018-11-03 ENCOUNTER — Other Ambulatory Visit: Payer: Self-pay | Admitting: Internal Medicine

## 2018-11-09 DIAGNOSIS — E119 Type 2 diabetes mellitus without complications: Secondary | ICD-10-CM | POA: Diagnosis not present

## 2018-11-09 DIAGNOSIS — I5020 Unspecified systolic (congestive) heart failure: Secondary | ICD-10-CM | POA: Diagnosis not present

## 2018-11-09 DIAGNOSIS — M1990 Unspecified osteoarthritis, unspecified site: Secondary | ICD-10-CM | POA: Diagnosis not present

## 2018-11-09 DIAGNOSIS — E785 Hyperlipidemia, unspecified: Secondary | ICD-10-CM | POA: Diagnosis not present

## 2018-11-09 DIAGNOSIS — I340 Nonrheumatic mitral (valve) insufficiency: Secondary | ICD-10-CM | POA: Diagnosis not present

## 2018-11-09 DIAGNOSIS — I38 Endocarditis, valve unspecified: Secondary | ICD-10-CM | POA: Diagnosis not present

## 2018-11-09 DIAGNOSIS — I10 Essential (primary) hypertension: Secondary | ICD-10-CM | POA: Diagnosis not present

## 2018-11-10 ENCOUNTER — Other Ambulatory Visit: Payer: Self-pay | Admitting: Internal Medicine

## 2018-11-19 ENCOUNTER — Ambulatory Visit (INDEPENDENT_AMBULATORY_CARE_PROVIDER_SITE_OTHER): Payer: Medicare Other

## 2018-11-19 ENCOUNTER — Other Ambulatory Visit: Payer: Self-pay

## 2018-11-19 ENCOUNTER — Encounter: Payer: Self-pay | Admitting: Internal Medicine

## 2018-11-19 ENCOUNTER — Ambulatory Visit (INDEPENDENT_AMBULATORY_CARE_PROVIDER_SITE_OTHER): Payer: Medicare Other | Admitting: Internal Medicine

## 2018-11-19 VITALS — BP 118/58 | HR 40 | Temp 98.7°F | Ht 59.0 in | Wt 137.0 lb

## 2018-11-19 VITALS — BP 118/58 | HR 40 | Temp 98.7°F | Ht 59.6 in | Wt 137.2 lb

## 2018-11-19 DIAGNOSIS — R062 Wheezing: Secondary | ICD-10-CM | POA: Diagnosis not present

## 2018-11-19 DIAGNOSIS — Z79899 Other long term (current) drug therapy: Secondary | ICD-10-CM | POA: Diagnosis not present

## 2018-11-19 DIAGNOSIS — R001 Bradycardia, unspecified: Secondary | ICD-10-CM | POA: Diagnosis not present

## 2018-11-19 DIAGNOSIS — I5042 Chronic combined systolic (congestive) and diastolic (congestive) heart failure: Secondary | ICD-10-CM | POA: Diagnosis not present

## 2018-11-19 DIAGNOSIS — I130 Hypertensive heart and chronic kidney disease with heart failure and stage 1 through stage 4 chronic kidney disease, or unspecified chronic kidney disease: Secondary | ICD-10-CM

## 2018-11-19 DIAGNOSIS — Z0000 Encounter for general adult medical examination without abnormal findings: Secondary | ICD-10-CM

## 2018-11-19 NOTE — Patient Instructions (Signed)

## 2018-11-19 NOTE — Progress Notes (Signed)
Subjective:   Demaria KOURTLYN ABERG is a 85 y.o. female who presents for Medicare Annual (Subsequent) preventive examination.  Review of Systems:  n/a Cardiac Risk Factors include: advanced age (>73men, >52 women);dyslipidemia;diabetes mellitus;hypertension     Objective:     Vitals: BP (!) 118/58 (BP Location: Left Arm, Patient Position: Sitting, Cuff Size: Normal)   Pulse (!) 40   Temp 98.7 F (37.1 C) (Oral)   Ht 4' 11.6" (1.514 m)   Wt 137 lb 3.2 oz (62.2 kg)   BMI 27.16 kg/m   Body mass index is 27.16 kg/m.  Advanced Directives 11/19/2018 07/28/2018 07/28/2018 03/05/2018 06/30/2014 12/21/2013 11/23/2013  Does Patient Have a Medical Advance Directive? Yes No Yes No Yes Yes Yes  Type of Paramedic of [place];Living will - [place];Living will - Living will - -  Does patient want to make changes to medical advance directive? - - - - - - No - Patient declined  Copy of [place] in Chart? No - copy requested - - - No - copy requested - No - copy requested  Would patient like information on creating a medical advance directive? - No - Patient declined - No - Patient declined - - -  Pre-existing out of facility DNR order (yellow form or pink MOST form) - - - - - - -    Tobacco Social History   Tobacco Use  Smoking Status Never Smoker  Smokeless Tobacco Never Used     Counseling given: Not Answered   Clinical Intake:  Pre-visit preparation completed: Yes  Pain : No/denies pain     Nutritional Status: BMI 25 -29 Overweight Nutritional Risks: None Diabetes: Yes CBG done?: No Did pt. bring in CBG monitor from home?: No  How often do you need to have someone help you when you read instructions, pamphlets, or other written materials from your doctor or pharmacy?: 1 - Never What is the last grade level you completed in school?: college  Interpreter Needed?: No  Information entered by :: NAllen LPN  Past  Medical History:  Diagnosis Date  . Arthritis   . Cardiomyopathy   . Cecal neoplasm   . CHF (congestive heart failure) ([place])   . Colon polyps   . Diverticulosis   . Hyperlipidemia   . Hypertension   . Insomnia   . Internal hemorrhoids   . Left bundle branch block (LBBB) on electrocardiogram    EKG of 5'12 shows 1st degree AV block/LBBB -Epic.   Past Surgical History:  Procedure Laterality Date  . ABDOMINAL HYSTERECTOMY    . COLON SURGERY    . COLONOSCOPY  05/27/2011   Procedure: COLONOSCOPY;  Surgeon: Juanita Craver, MD;  Location: [facility];  Service: Endoscopy;  Laterality: N/A;  . COLONOSCOPY WITH PROPOFOL N/A 06/30/2014   Procedure: COLONOSCOPY WITH PROPOFOL;  Surgeon: Juanita Craver, MD;  Location: [facility];  Service: Endoscopy;  Laterality: N/A;   Family History  Problem Relation Age of Onset  . Cancer Mother        COLON  . Stroke Father   . Diabetes Sister    Social History   Socioeconomic History  . Marital status: Widowed    Spouse name: Not on file  . Number of children: Not on file  . Years of education: Not on file  . Highest education level: Not on file  Occupational History  . Not on file  Social Needs  . Financial resource strain: Not hard  at all  . Food insecurity    Worry: Never true    Inability: Never true  . Transportation needs    Medical: No    Non-medical: No  Tobacco Use  . Smoking status: Never Smoker  . Smokeless tobacco: Never Used  Substance and Sexual Activity  . Alcohol use: No  . Drug use: No  . Sexual activity: Not Currently  Lifestyle  . Physical activity    Days per week: 7 days    Minutes per session: 120 min  . Stress: Not at all  Relationships  . Social Herbalist on phone: Not on file    Gets together: Not on file    Attends religious service: Not on file    Active member of club or organization: Not on file    Attends meetings of clubs or organizations: Not on file    Relationship status: Not on file   Other Topics Concern  . Not on file  Social History Narrative  . Not on file    Outpatient Encounter Medications as of 11/19/2018  Medication Sig  . amLODipine-valsartan (EXFORGE) 10-320 MG per tablet Take 1 tablet by mouth daily.   . carvedilol (COREG) 12.5 MG tablet Take 12.5 mg by mouth 2 (two) times daily with a meal.   . DEXILANT 60 MG capsule TAKE 1 CAPSULE BY MOUTH EVERY DAY  . diazepam (VALIUM) 5 MG tablet Take 2.5 mg by mouth at bedtime as needed for sedation.   . digoxin (LANOXIN) 0.125 MG tablet Take 0.125 mg by mouth daily.   . furosemide (LASIX) 20 MG tablet Take 1 tablet (20 mg total) by mouth daily. Take 1 tablet by mouth at lunch.  . furosemide (LASIX) 40 MG tablet Take 40 mg by mouth daily.   Marland Kitchen guaiFENesin (MUCINEX) 600 MG 12 hr tablet Take 1 tablet (600 mg total) by mouth 2 (two) times daily.  . Iron Polysacch Cmplx-B12-FA (NIFEREX-150 FORTE PO) Take 150 mg by mouth daily.   . potassium chloride (KLOR-CON) 8 MEQ CR tablet Take 8 mEq by mouth daily.    . Probiotic Product (RESTORA PO) Take 1 tablet by mouth daily.   . rosuvastatin (CRESTOR) 10 MG tablet Take 10 mg by mouth daily.    Marland Kitchen Spacer/Aero-Holding Chambers (AEROCHAMBER PLUS) inhaler Use as instructed  . traMADol (ULTRAM) 50 MG tablet Take 50 mg by mouth as needed for moderate pain.   . VENTOLIN HFA 108 (90 Base) MCG/ACT inhaler TAKE 2 PUFFS BY MOUTH EVERY 6 HOURS AS NEEDED FOR WHEEZE OR SHORTNESS OF BREATH  . benzonatate (TESSALON PERLES) 100 MG capsule Take 1 capsule (100 mg total) by mouth 3 (three) times daily as needed for cough. (Patient not taking: Reported on 11/19/2018)  . oxymetazoline (AFRIN) 0.05 % nasal spray Place 1 spray into both nostrils 2 (two) times daily as needed (nose bleeds).   No facility-administered encounter medications on file as of 11/19/2018.     Activities of Daily Living In your present state of health, do you have any difficulty performing the following activities: 11/19/2018  07/28/2018  Hearing? Y N  Comment left ear at times -  Vision? N N  Comment fine print -  Difficulty concentrating or making decisions? N N  Walking or climbing stairs? N Y  Dressing or bathing? N N  Doing errands, shopping? Tempie Donning  Comment has someone with her -  Conservation officer, nature and eating ? N -  Using the Toilet? N -  In the past six months, have you accidently leaked urine? N -  Do you have problems with loss of bowel control? N -  Managing your Medications? N -  Managing your Finances? N -  Housekeeping or managing your Housekeeping? N -  Some recent data might be hidden    Patient Care Team: Glendale Chard, MD as PCP - General (Internal Medicine)    Assessment:   This is a routine wellness examination for [place].  Exercise Activities and Dietary recommendations Current Exercise Habits: Home exercise routine, Type of exercise: strength training/weights;calisthenics;stretching, Time (Minutes): > 60, Frequency (Times/Week): >7, Weekly Exercise (Minutes/Week): 0  Goals    . Patient Stated     11/19/2018, no goals set       Fall Risk Fall Risk  11/19/2018 10/08/2018 06/24/2018 02/25/2018 01/20/2018  Falls in the past year? 0 0 0 0 0  Risk for fall due to : Medication side effect - - - -  Follow up Falls evaluation completed;Education provided;Falls prevention discussed - - - -   Is the patient's home free of loose throw rugs in walkways, pet beds, electrical cords, etc?   yes      Grab bars in the bathroom? no      Handrails on the stairs?   yes      Adequate lighting?   yes  Timed Get Up and Go performed: n/a  Depression Screen PHQ 2/9 Scores 11/19/2018 10/08/2018 06/24/2018 02/25/2018  PHQ - 2 Score 0 0 0 0  PHQ- 9 Score 2 - - -     Cognitive Function     6CIT Screen 11/19/2018  What Year? 0 points  What month? 0 points  What time? 0 points  Count back from 20 0 points  Months in reverse 4 points  Repeat phrase 8 points  Total Score 12    Immunization  History  Administered Date(s) Administered  . Hepatitis A 05/05/1925  . Hepatitis B 11/11/24  . Influenza, High Dose Seasonal PF 12/24/2017, 10/08/2018  . Pneumococcal Conjugate-13 05/05/1989  . Varicella 05/05/1937  . Zoster 05/06/1974    Qualifies for Shingles Vaccine? yes  Screening Tests Health Maintenance  Topic Date Due  . FOOT EXAM  05/06/1934  . OPHTHALMOLOGY EXAM  05/06/1934  . TETANUS/TDAP  11/19/2019 (Originally 12/19/2013)  . HEMOGLOBIN A1C  04/08/2019  . INFLUENZA VACCINE  Completed  . DEXA SCAN  Completed  . PNA vac Low Risk Adult  Completed    Cancer Screenings: Lung: Low Dose CT Chest recommended if Age 47-80 years, 30 pack-year currently smoking OR have quit w/in 15years. Patient does not qualify. Breast:  Up to date on Mammogram? Yes   Up to date of Bone Density/Dexa? Yes Colorectal: up to date  Additional Screenings: : Hepatitis C Screening: n/a     Plan:    Patient has no goals set at this time.   I have personally reviewed and noted the following in the patient's chart:   . Medical and social history . Use of alcohol, tobacco or illicit drugs  . Current medications and supplements . Functional ability and status . Nutritional status . Physical activity . Advanced directives . List of other physicians . Hospitalizations, surgeries, and ER visits in previous 12 months . Vitals . Screenings to include cognitive, depression, and falls . Referrals and appointments  In addition, I have reviewed and discussed with patient certain preventive protocols, quality metrics, and best practice recommendations. A written personalized care plan for preventive services  as well as general preventive health recommendations were provided to patient.     Kellie Simmering, LPN  [phone]

## 2018-11-19 NOTE — Patient Instructions (Signed)
Mariah Aguilar , Thank you for taking time to come for your Medicare Wellness Visit. I appreciate your ongoing commitment to your health goals. Please review the following plan we discussed and let me know if I can assist you in the future.   Screening recommendations/referrals: Colonoscopy: 06/2014 Mammogram: 04/2017 Bone Density: 08/2014 Recommended yearly ophthalmology/optometry visit for glaucoma screening and checkup Recommended yearly dental visit for hygiene and checkup  Vaccinations: Influenza vaccine: 10/2018 Pneumococcal vaccine: 2005 Tdap vaccine: declined Shingles vaccine: discussed    Advanced directives: Please bring a copy of your POA (Power of Attorney) and/or Living Will to your next appointment.    Conditions/risks identified: overweight  Next appointment: 11/24/2019 at 2:00   Preventive Care 17 Years and Older, Female Preventive care refers to lifestyle choices and visits with your health care provider that can promote health and wellness. What does preventive care include?  A yearly physical exam. This is also called an annual well check.  Dental exams once or twice a year.  Routine eye exams. Ask your health care provider how often you should have your eyes checked.  Personal lifestyle choices, including:  Daily care of your teeth and gums.  Regular physical activity.  Eating a healthy diet.  Avoiding tobacco and drug use.  Limiting alcohol use.  Practicing safe sex.  Taking low-dose aspirin every day.  Taking vitamin and mineral supplements as recommended by your health care provider. What happens during an annual well check? The services and screenings done by your health care provider during your annual well check will depend on your age, overall health, lifestyle risk factors, and family history of disease. Counseling  Your health care provider may ask you questions about your:  Alcohol use.  Tobacco use.  Drug use.  Emotional  well-being.  Home and relationship well-being.  Sexual activity.  Eating habits.  History of falls.  Memory and ability to understand (cognition).  Work and work Statistician.  Reproductive health. Screening  You may have the following tests or measurements:  Height, weight, and BMI.  Blood pressure.  Lipid and cholesterol levels. These may be checked every 5 years, or more frequently if you are over 37 years old.  Skin check.  Lung cancer screening. You may have this screening every year starting at age 50 if you have a 30-pack-year history of smoking and currently smoke or have quit within the past 15 years.  Fecal occult blood test (FOBT) of the stool. You may have this test every year starting at age 39.  Flexible sigmoidoscopy or colonoscopy. You may have a sigmoidoscopy every 5 years or a colonoscopy every 10 years starting at age 42.  Hepatitis C blood test.  Hepatitis B blood test.  Sexually transmitted disease (STD) testing.  Diabetes screening. This is done by checking your blood sugar (glucose) after you have not eaten for a while (fasting). You may have this done every 1-3 years.  Bone density scan. This is done to screen for osteoporosis. You may have this done starting at age 13.  Mammogram. This may be done every 1-2 years. Talk to your health care provider about how often you should have regular mammograms. Talk with your health care provider about your test results, treatment options, and if necessary, the need for more tests. Vaccines  Your health care provider may recommend certain vaccines, such as:  Influenza vaccine. This is recommended every year.  Tetanus, diphtheria, and acellular pertussis (Tdap, Td) vaccine. You may need a Td booster  every 10 years.  Zoster vaccine. You may need this after age 80.  Pneumococcal 13-valent conjugate (PCV13) vaccine. One dose is recommended after age 88.  Pneumococcal polysaccharide (PPSV23) vaccine. One  dose is recommended after age 62. Talk to your health care provider about which screenings and vaccines you need and how often you need them. This information is not intended to replace advice given to you by your health care provider. Make sure you discuss any questions you have with your health care provider. Document Released: 01/20/2015 Document Revised: 09/13/2015 Document Reviewed: 10/25/2014 Elsevier Interactive Patient Education  2017 [place] Prevention in the Home Falls can cause injuries. They can happen to people of all ages. There are many things you can do to make your home safe and to help prevent falls. What can I do on the outside of my home?  Regularly fix the edges of walkways and driveways and fix any cracks.  Remove anything that might make you trip as you walk through a door, such as a raised step or threshold.  Trim any bushes or trees on the path to your home.  Use bright outdoor lighting.  Clear any walking paths of anything that might make someone trip, such as rocks or tools.  Regularly check to see if handrails are loose or broken. Make sure that both sides of any steps have handrails.  Any raised decks and porches should have guardrails on the edges.  Have any leaves, snow, or ice cleared regularly.  Use sand or salt on walking paths during winter.  Clean up any spills in your garage right away. This includes oil or grease spills. What can I do in the bathroom?  Use night lights.  Install grab bars by the toilet and in the tub and shower. Do not use towel bars as grab bars.  Use non-skid mats or decals in the tub or shower.  If you need to sit down in the shower, use a plastic, non-slip stool.  Keep the floor dry. Clean up any water that spills on the floor as soon as it happens.  Remove soap buildup in the tub or shower regularly.  Attach bath mats securely with double-sided non-slip rug tape.  Do not have throw rugs and other  things on the floor that can make you trip. What can I do in the bedroom?  Use night lights.  Make sure that you have a light by your bed that is easy to reach.  Do not use any sheets or blankets that are too big for your bed. They should not hang down onto the floor.  Have a firm chair that has side arms. You can use this for support while you get dressed.  Do not have throw rugs and other things on the floor that can make you trip. What can I do in the kitchen?  Clean up any spills right away.  Avoid walking on wet floors.  Keep items that you use a lot in easy-to-reach places.  If you need to reach something above you, use a strong step stool that has a grab bar.  Keep electrical cords out of the way.  Do not use floor polish or wax that makes floors slippery. If you must use wax, use non-skid floor wax.  Do not have throw rugs and other things on the floor that can make you trip. What can I do with my stairs?  Do not leave any items on the stairs.  Make  sure that there are handrails on both sides of the stairs and use them. Fix handrails that are broken or loose. Make sure that handrails are as long as the stairways.  Check any carpeting to make sure that it is firmly attached to the stairs. Fix any carpet that is loose or worn.  Avoid having throw rugs at the top or bottom of the stairs. If you do have throw rugs, attach them to the floor with carpet tape.  Make sure that you have a light switch at the top of the stairs and the bottom of the stairs. If you do not have them, ask someone to add them for you. What else can I do to help prevent falls?  Wear shoes that:  Do not have high heels.  Have rubber bottoms.  Are comfortable and fit you well.  Are closed at the toe. Do not wear sandals.  If you use a stepladder:  Make sure that it is fully opened. Do not climb a closed stepladder.  Make sure that both sides of the stepladder are locked into place.  Ask  someone to hold it for you, if possible.  Clearly mark and make sure that you can see:  Any grab bars or handrails.  First and last steps.  Where the edge of each step is.  Use tools that help you move around (mobility aids) if they are needed. These include:  Canes.  Walkers.  Scooters.  Crutches.  Turn on the lights when you go into a dark area. Replace any light bulbs as soon as they burn out.  Set up your furniture so you have a clear path. Avoid moving your furniture around.  If any of your floors are uneven, fix them.  If there are any pets around you, be aware of where they are.  Review your medicines with your doctor. Some medicines can make you feel dizzy. This can increase your chance of falling. Ask your doctor what other things that you can do to help prevent falls. This information is not intended to replace advice given to you by your health care provider. Make sure you discuss any questions you have with your health care provider. Document Released: 10/20/2008 Document Revised: 06/01/2015 Document Reviewed: 01/28/2014 Elsevier Interactive Patient Education  2017 [employer].

## 2018-11-20 LAB — MAGNESIUM: Magnesium: 2.6 mg/dL — ABNORMAL HIGH (ref 1.6–2.3)

## 2018-11-20 LAB — BMP8+EGFR
BUN/Creatinine Ratio: 18 (ref 12–28)
BUN: 20 mg/dL (ref 10–36)
CO2: 26 mmol/L (ref 20–29)
Calcium: 9 mg/dL (ref 8.7–10.3)
Chloride: 101 mmol/L (ref 96–106)
Creatinine, Ser: 1.11 mg/dL — ABNORMAL HIGH (ref 0.57–1.00)
GFR calc Af Amer: 49 mL/min/{1.73_m2} — ABNORMAL LOW (ref >59)
GFR calc non Af Amer: 43 mL/min/{1.73_m2} — ABNORMAL LOW (ref >59)
Glucose: 128 mg/dL — ABNORMAL HIGH (ref 65–99)
Potassium: 4.8 mmol/L (ref 3.5–5.2)
Sodium: 141 mmol/L (ref 134–144)

## 2018-11-20 LAB — BRAIN NATRIURETIC PEPTIDE: BNP: 868.8 pg/mL — ABNORMAL HIGH (ref 0.0–100.0)

## 2018-11-20 LAB — DIGOXIN LEVEL: Digoxin, Serum: 2.4 ng/mL (ref 0.5–0.9)

## 2018-11-21 ENCOUNTER — Telehealth: Payer: Self-pay

## 2018-11-21 ENCOUNTER — Other Ambulatory Visit: Payer: Self-pay

## 2018-11-21 MED ORDER — AMLODIPINE BESYLATE-VALSARTAN 10-320 MG PO TABS: 1.0000 | ORAL_TABLET | Freq: Every day | ORAL | 1 refills | Status: DC

## 2018-11-21 NOTE — Telephone Encounter (Signed)
Left vm for pt to return call  pls call Azhar Secora to see what refill she needs. Also, remind her to do daily weights, keep a log. is she less sob? She weighed 130 today

## 2018-11-21 NOTE — Telephone Encounter (Signed)
Pts daughter returned call. Pt complains of having a lot of mucus but does not want to take anything over the counter. Informed family of all the instructions from the provider.

## 2018-11-23 ENCOUNTER — Telehealth: Payer: Self-pay

## 2018-11-23 NOTE — Telephone Encounter (Signed)
The pt's granddaughter was given the pt's recent lab results at the pt's request.

## 2018-11-23 NOTE — Telephone Encounter (Signed)
Called pt daughter [name] to give her pts labs as requested by pt. Daughter did not answer. Left vm for her to call back

## 2018-11-24 ENCOUNTER — Inpatient Hospital Stay (HOSPITAL_COMMUNITY)
Admission: EM | Admit: 2018-11-24 | Discharge: 2018-11-27 | DRG: 291 | Disposition: A | Discharge status: Home / Self Care | Payer: Medicare Other | Attending: Internal Medicine | Admitting: Internal Medicine

## 2018-11-24 ENCOUNTER — Emergency Department (HOSPITAL_COMMUNITY): Payer: Medicare Other

## 2018-11-24 ENCOUNTER — Other Ambulatory Visit: Payer: Self-pay

## 2018-11-24 ENCOUNTER — Inpatient Hospital Stay (HOSPITAL_COMMUNITY): Payer: Medicare Other

## 2018-11-24 DIAGNOSIS — I130 Hypertensive heart and chronic kidney disease with heart failure and stage 1 through stage 4 chronic kidney disease, or unspecified chronic kidney disease: Principal | ICD-10-CM | POA: Diagnosis present

## 2018-11-24 DIAGNOSIS — J9601 Acute respiratory failure with hypoxia: Secondary | ICD-10-CM

## 2018-11-24 DIAGNOSIS — Z79899 Other long term (current) drug therapy: Secondary | ICD-10-CM

## 2018-11-24 DIAGNOSIS — Z86010 Personal history of colonic polyps: Secondary | ICD-10-CM

## 2018-11-24 DIAGNOSIS — G4700 Insomnia, unspecified: Secondary | ICD-10-CM | POA: Diagnosis present

## 2018-11-24 DIAGNOSIS — Z79891 Long term (current) use of opiate analgesic: Secondary | ICD-10-CM

## 2018-11-24 DIAGNOSIS — J9600 Acute respiratory failure, unspecified whether with hypoxia or hypercapnia: Secondary | ICD-10-CM | POA: Diagnosis not present

## 2018-11-24 DIAGNOSIS — I110 Hypertensive heart disease with heart failure: Secondary | ICD-10-CM | POA: Diagnosis not present

## 2018-11-24 DIAGNOSIS — I447 Left bundle-branch block, unspecified: Secondary | ICD-10-CM | POA: Diagnosis present

## 2018-11-24 DIAGNOSIS — Z91013 Allergy to seafood: Secondary | ICD-10-CM | POA: Diagnosis not present

## 2018-11-24 DIAGNOSIS — R000 Tachycardia, unspecified: Secondary | ICD-10-CM | POA: Diagnosis not present

## 2018-11-24 DIAGNOSIS — I5043 Acute on chronic combined systolic (congestive) and diastolic (congestive) heart failure: Secondary | ICD-10-CM | POA: Diagnosis present

## 2018-11-24 DIAGNOSIS — Z880 Allergy status to penicillin: Secondary | ICD-10-CM | POA: Diagnosis not present

## 2018-11-24 DIAGNOSIS — I420 Dilated cardiomyopathy: Secondary | ICD-10-CM | POA: Diagnosis present

## 2018-11-24 DIAGNOSIS — I480 Paroxysmal atrial fibrillation: Secondary | ICD-10-CM | POA: Diagnosis not present

## 2018-11-24 DIAGNOSIS — R140 Abdominal distension (gaseous): Secondary | ICD-10-CM | POA: Diagnosis not present

## 2018-11-24 DIAGNOSIS — I10 Essential (primary) hypertension: Secondary | ICD-10-CM | POA: Diagnosis not present

## 2018-11-24 DIAGNOSIS — N182 Chronic kidney disease, stage 2 (mild): Secondary | ICD-10-CM | POA: Diagnosis present

## 2018-11-24 DIAGNOSIS — E1122 Type 2 diabetes mellitus with diabetic chronic kidney disease: Secondary | ICD-10-CM | POA: Diagnosis present

## 2018-11-24 DIAGNOSIS — Z20828 Contact with and (suspected) exposure to other viral communicable diseases: Secondary | ICD-10-CM | POA: Diagnosis present

## 2018-11-24 DIAGNOSIS — I5022 Chronic systolic (congestive) heart failure: Secondary | ICD-10-CM | POA: Diagnosis not present

## 2018-11-24 DIAGNOSIS — I340 Nonrheumatic mitral (valve) insufficiency: Secondary | ICD-10-CM | POA: Diagnosis present

## 2018-11-24 DIAGNOSIS — E876 Hypokalemia: Secondary | ICD-10-CM | POA: Diagnosis not present

## 2018-11-24 DIAGNOSIS — I509 Heart failure, unspecified: Secondary | ICD-10-CM

## 2018-11-24 DIAGNOSIS — I5031 Acute diastolic (congestive) heart failure: Secondary | ICD-10-CM | POA: Diagnosis not present

## 2018-11-24 DIAGNOSIS — K219 Gastro-esophageal reflux disease without esophagitis: Secondary | ICD-10-CM | POA: Diagnosis present

## 2018-11-24 DIAGNOSIS — E785 Hyperlipidemia, unspecified: Secondary | ICD-10-CM | POA: Diagnosis not present

## 2018-11-24 DIAGNOSIS — M1990 Unspecified osteoarthritis, unspecified site: Secondary | ICD-10-CM | POA: Diagnosis present

## 2018-11-24 DIAGNOSIS — I5023 Acute on chronic systolic (congestive) heart failure: Secondary | ICD-10-CM | POA: Diagnosis not present

## 2018-11-24 DIAGNOSIS — R0602 Shortness of breath: Secondary | ICD-10-CM | POA: Diagnosis not present

## 2018-11-24 DIAGNOSIS — E119 Type 2 diabetes mellitus without complications: Secondary | ICD-10-CM | POA: Diagnosis not present

## 2018-11-24 DIAGNOSIS — R1013 Epigastric pain: Secondary | ICD-10-CM | POA: Diagnosis not present

## 2018-11-24 DIAGNOSIS — R001 Bradycardia, unspecified: Secondary | ICD-10-CM | POA: Diagnosis not present

## 2018-11-24 DIAGNOSIS — R0902 Hypoxemia: Secondary | ICD-10-CM | POA: Diagnosis not present

## 2018-11-24 LAB — CBC WITH DIFFERENTIAL/PLATELET
Abs Immature Granulocytes: 0.04 10*3/uL (ref 0.00–0.07)
Basophils Absolute: 0.1 10*3/uL (ref 0.0–0.1)
Basophils Relative: 1 %
Eosinophils Absolute: 0.4 10*3/uL (ref 0.0–0.5)
Eosinophils Relative: 4 %
HCT: 42.6 % (ref 36.0–46.0)
Hemoglobin: 13.2 g/dL (ref 12.0–15.0)
Immature Granulocytes: 0 %
Lymphocytes Relative: 24 %
Lymphs Abs: 2.2 10*3/uL (ref 0.7–4.0)
MCH: 27.2 pg (ref 26.0–34.0)
MCHC: 31 g/dL (ref 30.0–36.0)
MCV: 87.8 fL (ref 80.0–100.0)
Monocytes Absolute: 0.9 10*3/uL (ref 0.1–1.0)
Monocytes Relative: 10 %
Neutro Abs: 5.6 10*3/uL (ref 1.7–7.7)
Neutrophils Relative %: 61 %
Platelets: 900 10*3/uL — ABNORMAL HIGH (ref 150–400)
RBC: 4.85 MIL/uL (ref 3.87–5.11)
RDW: 17.5 % — ABNORMAL HIGH (ref 11.5–15.5)
WBC: 9.3 10*3/uL (ref 4.0–10.5)
nRBC: 0 % (ref 0.0–0.2)

## 2018-11-24 LAB — TROPONIN I (HIGH SENSITIVITY)
Troponin I (High Sensitivity): 20 ng/L — ABNORMAL HIGH (ref <18)
Troponin I (High Sensitivity): 17 ng/L (ref <18)

## 2018-11-24 LAB — COMPREHENSIVE METABOLIC PANEL
ALT: 36 U/L (ref 0–44)
AST: 30 U/L (ref 15–41)
Albumin: 3.8 g/dL (ref 3.5–5.0)
Alkaline Phosphatase: 48 U/L (ref 38–126)
Anion gap: 9 (ref 5–15)
BUN: 11 mg/dL (ref 8–23)
CO2: 24 mmol/L (ref 22–32)
Calcium: 8.3 mg/dL — ABNORMAL LOW (ref 8.9–10.3)
Chloride: 104 mmol/L (ref 98–111)
Creatinine, Ser: 0.83 mg/dL (ref 0.44–1.00)
GFR calc Af Amer: >60 mL/min (ref >60)
GFR calc non Af Amer: >60 mL/min (ref >60)
Glucose, Bld: 224 mg/dL — ABNORMAL HIGH (ref 70–99)
Potassium: 4.6 mmol/L (ref 3.5–5.1)
Sodium: 137 mmol/L (ref 135–145)
Total Bilirubin: 0.7 mg/dL (ref 0.3–1.2)
Total Protein: 7.4 g/dL (ref 6.5–8.1)

## 2018-11-24 LAB — BRAIN NATRIURETIC PEPTIDE: B Natriuretic Peptide: 1991.1 pg/mL — ABNORMAL HIGH (ref 0.0–100.0)

## 2018-11-24 LAB — SARS CORONAVIRUS 2 (TAT 6-24 HRS): SARS Coronavirus 2: NEGATIVE

## 2018-11-24 MED ORDER — ALBUTEROL SULFATE HFA 108 (90 BASE) MCG/ACT IN AERS
2.0000 | INHALATION_SPRAY | Freq: Four times a day (QID) | RESPIRATORY_TRACT | Status: DC | PRN
  Administered 2018-11-25 (×2): 2 via RESPIRATORY_TRACT
  Filled 2018-11-24: qty 6.7

## 2018-11-24 MED ORDER — ALBUTEROL SULFATE HFA 108 (90 BASE) MCG/ACT IN AERS: 1.0000 | INHALATION_SPRAY | Freq: Once | RESPIRATORY_TRACT | Status: DC

## 2018-11-24 MED ORDER — IRBESARTAN 300 MG PO TABS
300.0000 mg | ORAL_TABLET | Freq: Every day | ORAL | Status: DC
  Filled 2018-11-24 (×2): qty 1

## 2018-11-24 MED ORDER — METOCLOPRAMIDE HCL 5 MG/ML IJ SOLN
5.0000 mg | Freq: Four times a day (QID) | INTRAMUSCULAR | Status: AC
  Administered 2018-11-25 (×4): 5 mg via INTRAVENOUS
  Filled 2018-11-24 (×4): qty 2

## 2018-11-24 MED ORDER — ROSUVASTATIN CALCIUM 5 MG PO TABS
10.0000 mg | ORAL_TABLET | Freq: Every day | ORAL | Status: DC
  Administered 2018-11-25 – 2018-11-27 (×3): 10 mg via ORAL
  Filled 2018-11-24 (×3): qty 2

## 2018-11-24 MED ORDER — DIGOXIN 125 MCG PO TABS
0.1250 mg | ORAL_TABLET | Freq: Every day | ORAL | Status: DC
  Administered 2018-11-25: 0.125 mg via ORAL
  Filled 2018-11-24: qty 1

## 2018-11-24 MED ORDER — ENOXAPARIN SODIUM 40 MG/0.4ML ~~LOC~~ SOLN
40.0000 mg | SUBCUTANEOUS | Status: DC
  Administered 2018-11-25 – 2018-11-26 (×3): 40 mg via SUBCUTANEOUS
  Filled 2018-11-24 (×3): qty 0.4

## 2018-11-24 MED ORDER — FUROSEMIDE 10 MG/ML IJ SOLN
40.0000 mg | Freq: Two times a day (BID) | INTRAMUSCULAR | Status: DC
  Administered 2018-11-25 – 2018-11-26 (×3): 40 mg via INTRAVENOUS
  Filled 2018-11-24 (×3): qty 4

## 2018-11-24 MED ORDER — AMLODIPINE BESYLATE 10 MG PO TABS
10.0000 mg | ORAL_TABLET | Freq: Every day | ORAL | Status: DC
  Administered 2018-11-25: 10 mg via ORAL
  Filled 2018-11-24: qty 1
  Filled 2018-11-24: qty 2

## 2018-11-24 MED ORDER — AMLODIPINE BESYLATE-VALSARTAN 10-320 MG PO TABS: 1.0000 | ORAL_TABLET | Freq: Every day | ORAL | Status: DC

## 2018-11-24 MED ORDER — ALBUTEROL SULFATE HFA 108 (90 BASE) MCG/ACT IN AERS
2.0000 | INHALATION_SPRAY | Freq: Once | RESPIRATORY_TRACT | Status: AC
  Administered 2018-11-24: 2 via RESPIRATORY_TRACT
  Filled 2018-11-24: qty 6.7

## 2018-11-24 MED ORDER — CARVEDILOL 12.5 MG PO TABS
12.5000 mg | ORAL_TABLET | Freq: Two times a day (BID) | ORAL | Status: DC
  Administered 2018-11-25 – 2018-11-27 (×5): 12.5 mg via ORAL
  Filled 2018-11-24 (×5): qty 1

## 2018-11-24 MED ORDER — FUROSEMIDE 10 MG/ML IJ SOLN
40.0000 mg | Freq: Once | INTRAMUSCULAR | Status: AC
  Administered 2018-11-24: 40 mg via INTRAVENOUS
  Filled 2018-11-24: qty 4

## 2018-11-24 NOTE — ED Notes (Signed)
Family at bedside. 

## 2018-11-24 NOTE — ED Triage Notes (Signed)
Per [facility] pt coming from home c/o shortness of breath x a few days but worsening today. Hx of COPD and CHF. Initial oxygen sats in the 60s. 98% on non rebreather. Patient did neb treatment PTA.

## 2018-11-24 NOTE — ED Provider Notes (Signed)
[facility] EMERGENCY DEPARTMENT Provider Note   CSN: [id number] Arrival date & time: 11/24/18  1547     History   Chief Complaint Chief Complaint  Patient presents with  . Respiratory Distress    HPI Mariah Aguilar is a 85 y.o. female.  Presents emerge department chief complaint shortness of breath.  Patient reports that over the last few days she has noted steadily worsening shortness of breath but simply worse today.  Worse with any sort of exertion.  No associated chest pain.  States she does not use oxygen at home.  Did try her inhaler at home with minimal change in symptoms.  Has noted some swelling in her legs, no pain in her legs.  No changes in her medications recently.  Reports she has been taking her Lasix.       HPI  Past Medical History:  Diagnosis Date  . Arthritis   . Cardiomyopathy   . Cecal neoplasm   . CHF (congestive heart failure) ([place])   . Colon polyps   . Diverticulosis   . Hyperlipidemia   . Hypertension   . Insomnia   . Internal hemorrhoids   . Left bundle branch block (LBBB) on electrocardiogram    EKG of 5'12 shows 1st degree AV block/LBBB -Epic.    Patient Active Problem List   Diagnosis Date Noted  . Hypertensive heart and renal disease 11/01/2018  . Diastolic dysfunction [id number]  . Absolute anemia 11/01/2018  . Acute on chronic diastolic (congestive) heart failure ([place]) 07/28/2018  . GERD (gastroesophageal reflux disease) 07/28/2018  . Large bowel diverticular disease 03/06/2018  . HLD (hyperlipidemia) 03/06/2018  . HTN (hypertension) 03/06/2018  . Rectal bleed 03/05/2018  . Type 2 diabetes mellitus with stage 2 chronic kidney disease, without long-term current use of insulin ([place]) 12/24/2017  . Chronic renal disease, stage II 12/24/2017  . 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 Estrogen deficiency 12/24/2017  . Fracture of superior pubic ramus ([place]) 05/08/2012  . Inferior pubic ramus fracture ([place])  05/08/2012  . Osteoarthritis of right knee 08/19/2011  . Scoliosis of lumbar spine 05/21/2011  . Scoliosis of thoracic spine 05/21/2011    Past Surgical History:  Procedure Laterality Date  . ABDOMINAL HYSTERECTOMY    . COLON SURGERY    . COLONOSCOPY  05/27/2011   Procedure: COLONOSCOPY;  Surgeon: Juanita Craver, MD;  Location: [facility];  Service: Endoscopy;  Laterality: N/A;  . COLONOSCOPY WITH PROPOFOL N/A 06/30/2014   Procedure: COLONOSCOPY WITH PROPOFOL;  Surgeon: Juanita Craver, MD;  Location: [facility];  Service: Endoscopy;  Laterality: N/A;     OB History    Gravida  1   Para      Term      Preterm      AB      Living        SAB      TAB      Ectopic      Multiple      Live Births               Home Medications    Prior to Admission medications   Medication Sig Start Date End Date Taking? Authorizing Provider  amLODipine-valsartan (EXFORGE) 10-320 MG tablet Take 1 tablet by mouth daily. 11/21/18   Glendale Chard, MD  benzonatate (TESSALON PERLES) 100 MG capsule Take 1 capsule (100 mg total) by mouth 3 (three) times daily as needed for cough. Patient not taking: Reported on 11/19/2018 06/24/18 06/24/19  Glendale Chard, MD  carvedilol (COREG) 12.5 MG tablet Take 12.5 mg by mouth 2 (two) times daily with a meal.  11/10/13   [provider]  DEXILANT 60 MG capsule TAKE 1 CAPSULE BY MOUTH EVERY DAY 11/03/18   Glendale Chard, MD  diazepam (VALIUM) 5 MG tablet Take 2.5 mg by mouth at bedtime as needed for sedation.  12/11/13   [provider]  digoxin (LANOXIN) 0.125 MG tablet Take 0.125 mg by mouth daily.  10/07/13   [provider]  furosemide (LASIX) 20 MG tablet Take 1 tablet (20 mg total) by mouth daily. Take 1 tablet by mouth at lunch. 10/15/18 10/15/19  Glendale Chard, MD  furosemide (LASIX) 40 MG tablet Take 40 mg by mouth daily.     [provider]  guaiFENesin (MUCINEX) 600 MG 12 hr tablet Take 1 tablet (600 mg total) by  mouth 2 (two) times daily. 07/31/18   Swayze, Ava, DO  Iron Polysacch Cmplx-B12-FA (NIFEREX-150 FORTE PO) Take 150 mg by mouth daily.     [provider]  oxymetazoline (AFRIN) 0.05 % nasal spray Place 1 spray into both nostrils 2 (two) times daily as needed (nose bleeds).    [provider]  potassium chloride (KLOR-CON) 8 MEQ CR tablet Take 8 mEq by mouth daily.      [provider]  Probiotic Product (RESTORA PO) Take 1 tablet by mouth daily.     [provider]  rosuvastatin (CRESTOR) 10 MG tablet Take 10 mg by mouth daily.      [provider]  Spacer/Aero-Holding Chambers (AEROCHAMBER PLUS) inhaler Use as instructed 10/08/18   Glendale Chard, MD  traMADol (ULTRAM) 50 MG tablet Take 50 mg by mouth as needed for moderate pain.  10/13/17   [provider]  VENTOLIN HFA 108 (90 Base) MCG/ACT inhaler TAKE 2 PUFFS BY MOUTH EVERY 6 HOURS AS NEEDED FOR WHEEZE OR SHORTNESS OF BREATH 11/10/18   Glendale Chard, MD    Family History Family History  Problem Relation Age of Onset  . Cancer Mother        COLON  . Stroke Father   . Diabetes Sister     Social History Social History   Tobacco Use  . Smoking status: Never Smoker  . Smokeless tobacco: Never Used  Substance Use Topics  . Alcohol use: No  . Drug use: No     Allergies   Shrimp [shellfish allergy] and Penicillins   Review of Systems Review of Systems  Constitutional: Negative for chills and fever.  HENT: Negative for ear pain and sore throat.   Eyes: Negative for pain and visual disturbance.  Respiratory: Positive for shortness of breath. Negative for cough.   Cardiovascular: Negative for chest pain and palpitations.  Gastrointestinal: Negative for abdominal pain and vomiting.  Genitourinary: Negative for dysuria and hematuria.  Musculoskeletal: Negative for arthralgias and back pain.  Skin: Negative for color change and rash.  Neurological: Negative for seizures and  syncope.  All other systems reviewed and are negative.    Physical Exam Updated Vital Signs BP (!) 147/79   Pulse 64   Temp 98.6 F (37 C) (Oral)   Resp (!) 23   Ht 4\' 11"  (1.499 m)   Wt 62.1 kg   SpO2 94%   BMI 27.67 kg/m   Physical Exam Vitals signs and nursing note reviewed.  Constitutional:      General: She is not in acute distress.    Appearance: She is  well-developed.  HENT:     Head: Normocephalic and atraumatic.  Eyes:     Conjunctiva/sclera: Conjunctivae normal.  Neck:     Musculoskeletal: Neck supple.  Cardiovascular:     Rate and Rhythm: Normal rate and regular rhythm.     Heart sounds: No murmur.  Pulmonary:     Effort: No respiratory distress.     Comments: Mild tachypnea, somewhat coarse breath sounds noted bilaterally Abdominal:     Palpations: Abdomen is soft.     Tenderness: There is no abdominal tenderness.  Musculoskeletal:     Comments: Mild edema in lower extremities, no tenderness palpation over calf or thighs  Skin:    General: Skin is warm and dry.     Capillary Refill: Capillary refill takes less than 2 seconds.  Neurological:     General: No focal deficit present.     Mental Status: She is alert and oriented to person, place, and time.  Psychiatric:        Mood and Affect: Mood normal.        Behavior: Behavior normal.      ED Treatments / Results  Labs (all labs ordered are listed, but only abnormal results are displayed) Labs Reviewed  CBC WITH DIFFERENTIAL/PLATELET - Abnormal; Notable for the following components:      Result Value   RDW 17.5 (*)    Platelets 900 (*)    All other components within normal limits  COMPREHENSIVE METABOLIC PANEL - Abnormal; Notable for the following components:   Glucose, Bld 224 (*)    Calcium 8.3 (*)    All other components within normal limits  BRAIN NATRIURETIC PEPTIDE - Abnormal; Notable for the following components:   B Natriuretic Peptide 1,991.1 (*)    All other components within  normal limits  TROPONIN I (HIGH SENSITIVITY) - Abnormal; Notable for the following components:   Troponin I (High Sensitivity) 20 (*)    All other components within normal limits  SARS CORONAVIRUS 2 (TAT 6-24 HRS)  TROPONIN I (HIGH SENSITIVITY)    EKG EKG Interpretation  Date/Time:  Tuesday November 24 2018 16:07:41 EST Ventricular Rate:  74 PR Interval:    QRS Duration: 159 QT Interval:  441 QTC Calculation: 490 R Axis:   -61 Text Interpretation: Poor data quality likely sinus rhythm Left bundle branch block no STEMI Confirmed by Madalyn Rob [phone]) on 11/24/2018 4:37:28 PM   Radiology Dg Chest Portable 1 View  Result Date: 11/24/2018 CLINICAL DATA:  Shortness of breath EXAM: PORTABLE CHEST 1 VIEW COMPARISON:  07/28/2018, CT 10/16/2015 FINDINGS: Small bilateral pleural effusions. Marked cardiomegaly with vascular congestion and diffuse interstitial edema. Patchy atelectasis or pneumonia at the bases. Aortic atherosclerosis. No pneumothorax. IMPRESSION: 1. Cardiomegaly with vascular congestion, interstitial pulmonary edema and small pleural effusions 2. Airspace disease at the left greater than right lung base, atelectasis versus pneumonia. Electronically Signed   By: Donavan Foil M.D.   On: 11/24/2018 16:28    Procedures .Critical Care Performed by: Lucrezia Starch, MD Authorized by: Lucrezia Starch, MD   Critical care provider statement:    Critical care time (minutes):  35   Critical care was necessary to treat or prevent imminent or life-threatening deterioration of the following conditions:  Respiratory failure   Critical care was time spent personally by me on the following activities:  Discussions with consultants, evaluation of patient's response to treatment, examination of patient, ordering and performing treatments and interventions, ordering and review of laboratory studies, ordering  and review of radiographic studies, pulse oximetry, re-evaluation of  patient's condition, obtaining history from patient or surrogate and review of old charts   (including critical care time)  Medications Ordered in ED Medications  albuterol (VENTOLIN HFA) 108 (90 Base) MCG/ACT inhaler 2 puff (2 puffs Inhalation Given 11/24/18 1656)  furosemide (LASIX) injection 40 mg (40 mg Intravenous Given 11/24/18 1801)     Initial Impression / Assessment and Plan / ED Course  I have reviewed the triage vital signs and the nursing notes.  Pertinent labs & imaging results that were available during my care of the patient were reviewed by me and considered in my medical decision making (see chart for details).        85 year old lady with past medical history of heart failure, diabetes, hypertension presents to ER with shortness of breath.  On exam patient noted to have mild tachypnea, new oxygen requirement.  Chest x-ray concerning for increased vascular congestion.  Her BNP was markedly elevated.  I suspect most likely etiology is a heart failure exacerbation.  Given dose of IV Lasix, will admit to the hospital service for further management.  Final Clinical impression / ED Diagnoses   Final diagnoses:  Acute respiratory failure with hypoxia ([place])  Acute on chronic congestive heart failure, unspecified heart failure type [facility])    ED Discharge Orders    None       Lucrezia Starch, MD 11/24/18 1907

## 2018-11-24 NOTE — H&P (Signed)
History and Physical  Mariah Aguilar [id number] DOB: 04/12/1922 DOA: 11/24/2018  Referring physician: ER provider PCP: Glendale Chard, MD  Outpatient Specialists: Cardiologist, Dr. Terrence Dupont Patient coming from: Home  Chief Complaint: Shortness of breath for 2 days  HPI: Patient is a 85 year old African-American female with past medical history significant for congestive heart failure with EF of 20 to 25%, left bundle branch block, hypertension, hyperlipidemia, chronic thrombocytosis and arthritis.  Patient presents with 2-day history of progressive worsening shortness of breath, dyspnea on exertion with leg edema.  No fever or chills, no cough or phlegm production.  No headache, no neck pain, no chest pain, no URI symptoms or urinary symptoms.  Presentation to the hospital, cardiac BNP was elevated at 1991.  Chest x-ray said to reveal cardiomegaly with vascular congestion, interstitial pulmonary edema and small pleural effusion.  Airspace disease was also reported, left greater than right lung base, concerning for atelectasis versus pneumonia (reviewing prior chest x-ray done on July 28, 2018, bibasilar lung findings may not be new).  COVID-19 test is pending.  ED Course: On presentation to the hospital, temperature was 98.6, blood pressure 147/79, heart rate of 64 with respiratory rate of 23 and O2 sat of 94%.  Patient was given IV Lasix 40 mg x 1 dose by the ER provider and the patient feels significantly improved.  Hospitalist team has been asked to admit patient for further assessment and management.  Pertinent labs: Chemistry reveals sodium of 137, potassium of 4.6, chloride 104, CO2 24, BUN of 11, creatinine of 0.83 with blood sugar of 224.  Cardiac BNP is 1991.  High sensitive troponin ranges from 17-20.  CBC reveals WBC of 9.3, hemoglobin of 13.2, hematocrit of 42.6, MCV of 87.8 with platelet count of 900 (patient has chronic thrombocytosis).  EKG: Independently reviewed.   Imaging:  independently reviewed.   Review of Systems:  Negative for fever, visual changes, sore throat, rash, new muscle aches, chest pain, dysuria, bleeding, n/v/abdominal pain.  Past Medical History:  Diagnosis Date  . Arthritis   . Cardiomyopathy   . Cecal neoplasm   . CHF (congestive heart failure) ([place])   . Colon polyps   . Diverticulosis   . Hyperlipidemia   . Hypertension   . Insomnia   . Internal hemorrhoids   . Left bundle branch block (LBBB) on electrocardiogram    EKG of 5'12 shows 1st degree AV block/LBBB -Epic.    Past Surgical History:  Procedure Laterality Date  . ABDOMINAL HYSTERECTOMY    . COLON SURGERY    . COLONOSCOPY  05/27/2011   Procedure: COLONOSCOPY;  Surgeon: Juanita Craver, MD;  Location: [facility];  Service: Endoscopy;  Laterality: N/A;  . COLONOSCOPY WITH PROPOFOL N/A 06/30/2014   Procedure: COLONOSCOPY WITH PROPOFOL;  Surgeon: Juanita Craver, MD;  Location: [facility];  Service: Endoscopy;  Laterality: N/A;     reports that she has never smoked. She has never used smokeless tobacco. She reports that she does not drink alcohol or use drugs.  Allergies  Allergen Reactions  . Shrimp [Shellfish Allergy] Itching and Swelling  . Penicillins Hives    Did it involve swelling of the face/tongue/throat, SOB, or low BP? Unknown Did it involve sudden or severe rash/hives, skin peeling, or any reaction on the inside of your mouth or nose? Unknown Did you need to seek medical attention at a hospital or doctor's office? Unknown When did it last happen? If all above answers are "NO", may proceed with cephalosporin use.  Family History  Problem Relation Age of Onset  . Cancer Mother        COLON  . Stroke Father   . Diabetes Sister      Prior to Admission medications   Medication Sig Start Date End Date Taking? Authorizing Provider  amLODipine-valsartan (EXFORGE) 10-320 MG tablet Take 1 tablet by mouth daily. 11/21/18   Glendale Chard, MD  benzonatate  (TESSALON PERLES) 100 MG capsule Take 1 capsule (100 mg total) by mouth 3 (three) times daily as needed for cough. Patient not taking: Reported on 11/19/2018 06/24/18 06/24/19  Glendale Chard, MD  carvedilol (COREG) 12.5 MG tablet Take 12.5 mg by mouth 2 (two) times daily with a meal.  11/10/13   [provider]  DEXILANT 60 MG capsule TAKE 1 CAPSULE BY MOUTH EVERY DAY 11/03/18   Glendale Chard, MD  diazepam (VALIUM) 5 MG tablet Take 2.5 mg by mouth at bedtime as needed for sedation.  12/11/13   [provider]  digoxin (LANOXIN) 0.125 MG tablet Take 0.125 mg by mouth daily.  10/07/13   [provider]  furosemide (LASIX) 20 MG tablet Take 1 tablet (20 mg total) by mouth daily. Take 1 tablet by mouth at lunch. 10/15/18 10/15/19  Glendale Chard, MD  furosemide (LASIX) 40 MG tablet Take 40 mg by mouth daily.     [provider]  guaiFENesin (MUCINEX) 600 MG 12 hr tablet Take 1 tablet (600 mg total) by mouth 2 (two) times daily. 07/31/18   Swayze, Ava, DO  Iron Polysacch Cmplx-B12-FA (NIFEREX-150 FORTE PO) Take 150 mg by mouth daily.     [provider]  oxymetazoline (AFRIN) 0.05 % nasal spray Place 1 spray into both nostrils 2 (two) times daily as needed (nose bleeds).    [provider]  potassium chloride (KLOR-CON) 8 MEQ CR tablet Take 8 mEq by mouth daily.      [provider]  Probiotic Product (RESTORA PO) Take 1 tablet by mouth daily.     [provider]  rosuvastatin (CRESTOR) 10 MG tablet Take 10 mg by mouth daily.      [provider]  Spacer/Aero-Holding Chambers (AEROCHAMBER PLUS) inhaler Use as instructed 10/08/18   Glendale Chard, MD  traMADol (ULTRAM) 50 MG tablet Take 50 mg by mouth as needed for moderate pain.  10/13/17   [provider]  VENTOLIN HFA 108 (90 Base) MCG/ACT inhaler TAKE 2 PUFFS BY MOUTH EVERY 6 HOURS AS NEEDED FOR WHEEZE OR SHORTNESS OF BREATH 11/10/18   Glendale Chard, MD    Physical  Exam: Vitals:   11/24/18 1730 11/24/18 1745 11/24/18 1800 11/24/18 1848  BP: (!) 152/79 (!) 143/72 (!) 141/89 (!) 147/79  Pulse: 64 62 68 64  Resp: (!) 23 (!) 22 (!) 23 (!) 23  Temp:      TempSrc:      SpO2: 96% 95% 91% 94%  Weight:      Height:        Constitutional:  . Appears calm and comfortable Eyes:  . No pallor. No jaundice.  ENMT:  . external ears, nose appear normal Neck:  . Neck is supple.  JVD is equivocal. Respiratory:  . Decreased air entry.    Marland Kitchen Respiratory effort normal. No retractions or accessory muscle use Cardiovascular:  . S1S2 . Bilateral leg edema 1-1+ Abdomen:  Abdomen is gaseously distended, nontender.  Organs difficult to assess.   Neurologic:  . Awake and alert. . Moves all limbs.  Wt  Readings from Last 3 Encounters:  11/24/18 62.1 kg  11/19/18 62.2 kg  11/19/18 62.1 kg    I have personally reviewed following labs and imaging studies  Labs on Admission:  CBC: Recent Labs  Lab 11/24/18 1603  WBC 9.3  NEUTROABS 5.6  HGB 13.2  HCT 42.6  MCV 87.8  PLT [id number]*   Basic Metabolic Panel: Recent Labs  Lab 11/19/18 1643 11/24/18 1603  NA 141 137  K 4.8 4.6  CL 101 104  CO2 26 24  GLUCOSE 128* 224*  BUN 20 11  CREATININE 1.11* 0.83  CALCIUM 9.0 8.3*  MG 2.6*  --    Liver Function Tests: Recent Labs  Lab 11/24/18 1603  AST 30  ALT 36  ALKPHOS 48  BILITOT 0.7  PROT 7.4  ALBUMIN 3.8   No results for input(s): LIPASE, AMYLASE in the last 168 hours. No results for input(s): AMMONIA in the last 168 hours. Coagulation Profile: No results for input(s): INR, PROTIME in the last 168 hours. Cardiac Enzymes: No results for input(s): CKTOTAL, CKMB, CKMBINDEX, TROPONINI in the last 168 hours. BNP (last 3 results) No results for input(s): PROBNP in the last 8760 hours. HbA1C: No results for input(s): HGBA1C in the last 72 hours. CBG: No results for input(s): GLUCAP in the last 168 hours. Lipid Profile: No results for input(s):  CHOL, HDL, LDLCALC, TRIG, CHOLHDL, LDLDIRECT in the last 72 hours. Thyroid Function Tests: No results for input(s): TSH, T4TOTAL, FREET4, T3FREE, THYROIDAB in the last 72 hours. Anemia Panel: No results for input(s): VITAMINB12, FOLATE, FERRITIN, TIBC, IRON, RETICCTPCT in the last 72 hours. Urine analysis:    Component Value Date/Time   BILIRUBINUR Negative 05/19/2018 1137   PROTEINUR Negative 05/19/2018 1137   UROBILINOGEN 0.2 05/19/2018 1137   NITRITE Negative 05/19/2018 1137   LEUKOCYTESUR Negative 05/19/2018 1137   Sepsis Labs: @LABRCNTIP (procalcitonin:4,lacticidven:4) )No results found for this or any previous visit (from the past 240 hour(s)).    Radiological Exams on Admission: Dg Chest Portable 1 View  Result Date: 11/24/2018 CLINICAL DATA:  Shortness of breath EXAM: PORTABLE CHEST 1 VIEW COMPARISON:  07/28/2018, CT 10/16/2015 FINDINGS: Small bilateral pleural effusions. Marked cardiomegaly with vascular congestion and diffuse interstitial edema. Patchy atelectasis or pneumonia at the bases. Aortic atherosclerosis. No pneumothorax. IMPRESSION: 1. Cardiomegaly with vascular congestion, interstitial pulmonary edema and small pleural effusions 2. Airspace disease at the left greater than right lung base, atelectasis versus pneumonia. Electronically Signed   By: Donavan Foil M.D.   On: 11/24/2018 16:28    EKG: Independently reviewed.   Active Problems:   * No active hospital problems. *   Assessment/Plan Acute on chronic CHF: -Admit patient for further assessment and management. -Etiology of current CHF exacerbation is not readily known -Last echo revealed EF of 20 to 25% -Continue IV Lasix -Low threshold to consult the cardiology team  Abdominal distention, gaseous, seems chronic: -Abdominal KUB ordered -Further management will depend on above.  Chronic thrombocytosis: -Platelet count is 900 today -Continue to monitor closely.  Hypertension: -Continue to  optimize.  Hyperlipidemia: -Continue current regimen  DVT prophylaxis: Subcutaneous Lovenox Code Status: Full code Family Communication:  Disposition Plan: This will depend on hospital course Consults called: None.  Patient's cardiologist is Dr. Terrence Dupont. Admission status: Inpatient  Time spent: 65 minutes  Dana Allan, MD  [facility] Pager #: [phone] 7PM-7AM contact night coverage as above  11/24/2018, 9:18 PM

## 2018-11-24 NOTE — ED Notes (Signed)
Patient ambulated to bed side commode with stand by assist. Patients oxygen saturation dropped into 80s while on 4L [place].

## 2018-11-24 NOTE — ED Notes (Signed)
Pt is reluctant to use purewick. Assisted pt onto to bedpan and encouraged her to give the purewick a chance since she had lasix and moving in bed is tiring for her.

## 2018-11-25 DIAGNOSIS — J9601 Acute respiratory failure with hypoxia: Secondary | ICD-10-CM | POA: Diagnosis present

## 2018-11-25 LAB — GLUCOSE, CAPILLARY
Glucose-Capillary: 132 mg/dL — ABNORMAL HIGH (ref 70–99)
Glucose-Capillary: 103 mg/dL — ABNORMAL HIGH (ref 70–99)

## 2018-11-25 LAB — CBC
HCT: 42.6 % (ref 36.0–46.0)
Hemoglobin: 13.7 g/dL (ref 12.0–15.0)
MCH: 27.6 pg (ref 26.0–34.0)
MCHC: 32.2 g/dL (ref 30.0–36.0)
MCV: 85.7 fL (ref 80.0–100.0)
Platelets: 772 10*3/uL — ABNORMAL HIGH (ref 150–400)
RBC: 4.97 MIL/uL (ref 3.87–5.11)
RDW: 17.4 % — ABNORMAL HIGH (ref 11.5–15.5)
WBC: 9.1 10*3/uL (ref 4.0–10.5)
nRBC: 0 % (ref 0.0–0.2)

## 2018-11-25 LAB — PHOSPHORUS: Phosphorus: 3.5 mg/dL (ref 2.5–4.6)

## 2018-11-25 LAB — MAGNESIUM
Magnesium: 2.2 mg/dL (ref 1.7–2.4)
Magnesium: 2.3 mg/dL (ref 1.7–2.4)

## 2018-11-25 LAB — TSH: TSH: 2.112 u[IU]/mL (ref 0.350–4.500)

## 2018-11-25 MED ORDER — LEVALBUTEROL HCL 1.25 MG/0.5ML IN NEBU
1.2500 mg | INHALATION_SOLUTION | Freq: Three times a day (TID) | RESPIRATORY_TRACT | Status: DC
  Administered 2018-11-25 – 2018-11-26 (×3): 1.25 mg via RESPIRATORY_TRACT
  Filled 2018-11-25 (×3): qty 0.5

## 2018-11-25 MED ORDER — ALBUTEROL SULFATE (2.5 MG/3ML) 0.083% IN NEBU: 2.5000 mg | INHALATION_SOLUTION | Freq: Four times a day (QID) | RESPIRATORY_TRACT | Status: DC | PRN

## 2018-11-25 MED ORDER — TRAMADOL HCL 50 MG PO TABS
50.0000 mg | ORAL_TABLET | ORAL | Status: DC | PRN
  Administered 2018-11-25: 50 mg via ORAL
  Filled 2018-11-25: qty 1

## 2018-11-25 MED ORDER — FUROSEMIDE 10 MG/ML IJ SOLN
20.0000 mg | Freq: Once | INTRAMUSCULAR | Status: AC
  Administered 2018-11-25: 20 mg via INTRAVENOUS
  Filled 2018-11-25: qty 2

## 2018-11-25 MED ORDER — INSULIN ASPART 100 UNIT/ML ~~LOC~~ SOLN: 0.0000 [IU] | Freq: Three times a day (TID) | SUBCUTANEOUS | Status: DC

## 2018-11-25 MED ORDER — DIAZEPAM 2 MG PO TABS: 2.0000 mg | ORAL_TABLET | Freq: Every evening | ORAL | Status: DC | PRN

## 2018-11-25 MED ORDER — SACUBITRIL-VALSARTAN 49-51 MG PO TABS
1.0000 | ORAL_TABLET | Freq: Two times a day (BID) | ORAL | Status: DC
  Administered 2018-11-25 – 2018-11-26 (×3): 1 via ORAL
  Filled 2018-11-25 (×5): qty 1

## 2018-11-25 NOTE — Consult Note (Signed)
Reason for Consult:Congestive heart failure Referring Physician  [facility]  Geoffrey ANNMARGARET CARUSONE is an 85 y.o. female.  [id number] is 85 year old female with past medical history significant for multiple medical problems, I.e. hypertension, diabetes mellitus, history of dilated cardiomyopathy and recurrent congestive heart failure secondary to diastolic/systolic dysfunction,severe mitral regurgitation  Not the candidate for any percutaneous intervention hyperlipidemia, osteoarthritis, was admitted yesterday because of progressive increasing shortness of breath associated with leg swelling for last few days.  Patient denies any chest pain, nausea, vomiting or diaphoresis.  States lately.  Her breathing has gone was associated with wheezing while just walking from the living room to family room.  Denies any fever or chills.  States she did reduce her Lasix dose on her own as she was feeling weak and tired.  Patient does give history of PND, orthopnea and leg swelling.  EKG done in the ED showed normal sinus rhythm with left bundle branch block.  Patient was noted to have elevated BNP 1991.  2 sets of sensitivity troponin I were negative.  Past Medical History:  Diagnosis Date  . Arthritis   . Cardiomyopathy   . Cecal neoplasm   . CHF (congestive heart failure) ([place])   . Colon polyps   . Diverticulosis   . Hyperlipidemia   . Hypertension   . Insomnia   . Internal hemorrhoids   . Left bundle branch block (LBBB) on electrocardiogram    EKG of 5'12 shows 1st degree AV block/LBBB -Epic.    Past Surgical History:  Procedure Laterality Date  . ABDOMINAL HYSTERECTOMY    . COLON SURGERY    . COLONOSCOPY  05/27/2011   Procedure: COLONOSCOPY;  Surgeon: Juanita Craver, MD;  [facility];  Service: Endoscopy;  Laterality: N/A;  . COLONOSCOPY WITH PROPOFOL N/A 06/30/2014   Procedure: COLONOSCOPY WITH PROPOFOL;  Surgeon: Juanita Craver, MD;  Location[facility];  Service: Endoscopy;   Laterality: N/A;    Family History  Problem Relation Age of Onset  . Cancer Mother        COLON  . Stroke Father   . Diabetes Sister     Social History:  reports that she has never smoked. She has never used smokeless tobacco. She reports that she does not drink alcohol or use drugs.  Allergies:  Allergies  Allergen Reactions  . Shrimp [Shellfish Allergy] Itching and Swelling  . Penicillins Hives    Did it involve swelling of the face/tongue/throat, SOB, or low BP? Unknown Did it involve sudden or severe rash/hives, skin peeling, or any reaction on the inside of your mouth or nose? Unknown Did you need to seek medical attention at a hospital or doctor's office? Unknown When did it last happen? If all above answers are "NO", may proceed with cephalosporin use.    Medications: I have reviewed the patient's current medications.  Results for orders placed or performed during the hospital encounter of 11/24/18 (from the past 48 hour(s))  CBC with Differential     Status: Abnormal   Collection Time: 11/24/18  4:03 PM  Result Value Ref Range   WBC 9.3 4.0 - 10.5 K/uL   RBC 4.85 3.87 - 5.11 MIL/uL   Hemoglobin 13.2 12.0 - 15.0 g/dL   HCT 42.6 36.0 - 46.0 %   MCV 87.8 80.0 - 100.0 fL   MCH 27.2 26.0 - 34.0 pg   MCHC 31.0 30.0 - 36.0 g/dL   RDW 17.5 (H) 11.5 - 15.5 %   Platelets 900 (H) 150 -  400 K/uL   nRBC 0.0 0.0 - 0.2 %   Neutrophils Relative % 61 %   Neutro Abs 5.6 1.7 - 7.7 K/uL   Lymphocytes Relative 24 %   Lymphs Abs 2.2 0.7 - 4.0 K/uL   Monocytes Relative 10 %   Monocytes Absolute 0.9 0.1 - 1.0 K/uL   Eosinophils Relative 4 %   Eosinophils Absolute 0.4 0.0 - 0.5 K/uL   Basophils Relative 1 %   Basophils Absolute 0.1 0.0 - 0.1 K/uL   Immature Granulocytes 0 %   Abs Immature Granulocytes 0.04 0.00 - 0.07 K/uL    Comment: Performed at [facility] [address], [place], [place] [address]  Comprehensive metabolic panel     Status: Abnormal   Collection  Time: 11/24/18  4:03 PM  Result Value Ref Range   Sodium 137 135 - 145 mmol/L   Potassium 4.6 3.5 - 5.1 mmol/L   Chloride 104 98 - 111 mmol/L   CO2 24 22 - 32 mmol/L   Glucose, Bld 224 (H) 70 - 99 mg/dL   BUN 11 8 - 23 mg/dL   Creatinine, Ser 0.83 0.44 - 1.00 mg/dL   Calcium 8.3 (L) 8.9 - 10.3 mg/dL   Total Protein 7.4 6.5 - 8.1 g/dL   Albumin 3.8 3.5 - 5.0 g/dL   AST 30 15 - 41 U/L   ALT 36 0 - 44 U/L   Alkaline Phosphatase 48 38 - 126 U/L   Total Bilirubin 0.7 0.3 - 1.2 mg/dL   GFR calc non Af Amer >60 >60 mL/min   GFR calc Af Amer >60 >60 mL/min   Anion gap 9 5 - 15    Comment: Performed at [facility] [address] [place], [place] [address]  Troponin I (High Sensitivity)     Status: Abnormal   Collection Time: 11/24/18  4:03 PM  Result Value Ref Range   Troponin I (High Sensitivity) 20 (H) <18 ng/L    Comment: (NOTE) Elevated high sensitivity troponin I (hsTnI) values and significant  changes across serial measurements may suggest ACS but many other  chronic and acute conditions are known to elevate hsTnI results.  Refer to the "Links" section for chest pain algorithms and additional  guidance. Performed at [facility], [place] [address], [place], [place] [address]   Brain natriuretic peptide     Status: Abnormal   Collection Time: 11/24/18  4:03 PM  Result Value Ref Range   B Natriuretic Peptide 1,991.1 (H) 0.0 - 100.0 pg/mL    Comment: Performed at [facility] [address] [place],  [address]  Troponin I (High Sensitivity)     Status: None   Collection Time: 11/24/18  5:50 PM  Result Value Ref Range   Troponin I (High Sensitivity) 17 <18 ng/L    Comment: (NOTE) Elevated high sensitivity troponin I (hsTnI) values and significant  changes across serial measurements may suggest ACS but many other  chronic and acute conditions are known to elevate hsTnI results.  Refer to the "Links" section for chest pain algorithms and additional   guidance. Performed at [facility], [place] [address] [place], [place] [address]   SARS CORONAVIRUS 2 (TAT 6-24 HRS) Nasopharyngeal Nasopharyngeal Swab     Status: None   Collection Time: 11/24/18  6:21 PM   Specimen: Nasopharyngeal Swab  Result Value Ref Range   SARS Coronavirus 2 NEGATIVE NEGATIVE    Comment: (NOTE) SARS-CoV-2  target nucleic acids are NOT DETECTED. The SARS-CoV-2 RNA is generally detectable in upper and lower respiratory specimens during the acute phase of infection. Negative results do not preclude SARS-CoV-2 infection, do not rule out co-infections with other pathogens, and should not be used as the sole basis for treatment or other patient management decisions. Negative results must be combined with clinical observations, patient history, and epidemiological information. The expected result is Negative. Fact Sheet for Patients: SugarRoll.be Fact Sheet for Healthcare Providers: https://www.woods-mathews.com/ This test is not yet approved or cleared by the [place] FDA and  has been authorized for detection and/or diagnosis of SARS-CoV-2 by FDA under an Emergency Use Authorization (EUA). This EUA will remain  in effect (meaning this test can be used) for the duration of the COVID-19 declaration under Section 56 4(b)(1) of the Act, 21 U.S.C. section 360bbb-3(b)(1), unless the authorization is terminated or revoked sooner. Performed at [facility], [place] [address], [place], [place] [address]   Magnesium     Status: None   Collection Time: 11/25/18  2:54 AM  Result Value Ref Range   Magnesium 2.2 1.7 - 2.4 mg/dL    Comment: Performed at [facility] [address] [place], [place] [address]  Phosphorus     Status: None   Collection Time: 11/25/18  2:54 AM  Result Value Ref Range   Phosphorus 3.5 2.5 - 4.6 mg/dL    Comment: Performed at [facility] [address], [place], [place]  [address]  TSH     Status: None   Collection Time: 11/25/18  2:54 AM  Result Value Ref Range   TSH 2.112 0.350 - 4.500 uIU/mL    Comment: Performed by a 3rd Generation assay with a functional sensitivity of <=0.01 uIU/mL. Performed at [facility], [place] [address] [place], [place] [address]   CBC     Status: Abnormal   Collection Time: 11/25/18  3:55 AM  Result Value Ref Range   WBC 9.1 4.0 - 10.5 K/uL   RBC 4.97 3.87 - 5.11 MIL/uL   Hemoglobin 13.7 12.0 - 15.0 g/dL   HCT 42.6 36.0 - 46.0 %   MCV 85.7 80.0 - 100.0 fL   MCH 27.6 26.0 - 34.0 pg   MCHC 32.2 30.0 - 36.0 g/dL   RDW 17.4 (H) 11.5 - 15.5 %   Platelets 772 (H) 150 - 400 K/uL   nRBC 0.0 0.0 - 0.2 %    Comment: Performed at [facility], [place] [address], [place], [place] [address]  Magnesium     Status: None   Collection Time: 11/25/18  3:55 AM  Result Value Ref Range   Magnesium 2.3 1.7 - 2.4 mg/dL    Comment: Performed at [facility] [address], [place], [place] [address]    Dg Chest Portable 1 View  Result Date: 11/24/2018 CLINICAL DATA:  Shortness of breath EXAM: PORTABLE CHEST 1 VIEW COMPARISON:  07/28/2018, CT 10/16/2015 FINDINGS: Small bilateral pleural effusions. Marked cardiomegaly with vascular congestion and diffuse interstitial edema. Patchy atelectasis or pneumonia at the bases. Aortic atherosclerosis. No pneumothorax. IMPRESSION: 1. Cardiomegaly with vascular congestion, interstitial pulmonary edema and small pleural effusions 2. Airspace disease at the left greater than right lung base, atelectasis versus pneumonia. Electronically Signed   By: Donavan Foil M.D.   On: 11/24/2018 16:28   Dg Abd Portable 1v  Result Date: 11/24/2018 CLINICAL DATA:  Abdominal distension. Epigastric pain. EXAM:  PORTABLE ABDOMEN - 1 VIEW COMPARISON:  CT 03/05/2018 FINDINGS: Gaseous distention of colonic segment in the central lower abdomen/pelvis, up to 10 cm. Air throughout the remainder of normal caliber  colon. No evidence of small bowel obstruction or free air. Pessary in place. Cardiomegaly with interstitial coarsening at the lung bases. No acute osseous abnormalities are seen. Scoliosis and degenerative change in the spine. IMPRESSION: 1. Gaseous distention of colonic segment in the central lower abdomen/pelvis possibly sigmoid colon based on prior CT. Recommend CT for further evaluation. 2. Cardiomegaly. Electronically Signed   By: Keith Rake M.D.   On: 11/24/2018 23:47    Review of Systems  Constitutional: Positive for malaise/fatigue. Negative for chills and fever.  HENT: Negative for hearing loss.   Eyes: Negative for blurred vision.  Respiratory: Positive for cough and shortness of breath.   Cardiovascular: Positive for leg swelling. Negative for chest pain.  Gastrointestinal: Negative for vomiting.  Genitourinary: Negative for dysuria.  Neurological: Positive for dizziness.   Blood pressure (!) 155/90, pulse 96, temperature 98.6 F (37 C), temperature source Oral, resp. rate (!) 22, height 4\' 11"  (1.499 m), weight 62.1 kg, SpO2 93 %. Physical Exam  Constitutional: She is oriented to person, place, and time.  HENT:  Head: Normocephalic and atraumatic.  Eyes: Conjunctivae are normal. Left eye exhibits no discharge. No scleral icterus.  Neck: Normal range of motion. Neck supple. Tracheal deviation present. No thyromegaly present.  Cardiovascular: Normal rate and regular rhythm.  Paradoxical splitting of second heart sound.  2/6 systolic murmur and S3 gallop noted  Respiratory:  Decreased breath sound at bases with bilateral rhonchi.  Rales noted  GI:  Soft, distended, mild generalized tenderness noted  Musculoskeletal:     Comments: No clubbing, cyanosis, 1+ edema noted  Neurological: She is alert and oriented to person, place, and time.    Assessment/Plan: Acute on chronic Decompensated systolic/diastolic congestive heart failure Acute respiratory failure secondary to  above.  Rule out pneumonia/bronchitis Dilated cardiomyopathy. Valvular heart disease with severe mitral regurgitation. Hypertension. Diabetes mellitus. Hyperlipidemia. Osteoarthritis. Plan DC amlodipine.  In view of severely depressed LV systolic function. Change Avapro to Entresto  as per orders Agree with IV Lasix. Antibiotics per primary team as appropriate Hold digoxin in view of recent digitoxicity Monitor renal function while on IV Lasix Charolette Forward 11/25/2018, 11:35 AM

## 2018-11-25 NOTE — ED Notes (Signed)
Breakfast Ordered 

## 2018-11-25 NOTE — ED Notes (Signed)
Diet tray ordered 

## 2018-11-25 NOTE — Progress Notes (Signed)
[facility] PROGRESS NOTE  Mariah Aguilar [id number] DOB: 04-06-1922 DOA: 11/24/2018 PCP: Glendale Chard, MD  Assessment/Plan:  #1. Acute respiratory failure with hypoxia secondary to acute on chronic combined diastolic/systolic HF. bnp [id number]. Chest xray with vascular congestion. Oxygen saturation level reprotedly 88% on 2L [place]. She does not wear oxygen at home. She reports decreasing her lasix dose of her own accord. She is afebrile, no leukocytosis and non-toxic appearing.  Provided with 60mg  lasix IV and has diuresed 1L as of this am. Remains with increased wob, audible wheeze, doe and LE edema. Evaluated by cards who recommended continuing IV lasix and changing avapro to entresto, hold dig and stop amlodipine -meds as noted above -continue lasix 40mg  IV bid -give extra 20mg  lasix IV now -oxygen supplementation -monitor oxygen saturation level. -wean oxygen as able  #2. Acute on chronic combined diastolic/systolic heart failure. Likely related to fact that she decreased her home lasix dose.  Echo 07/2018 with EF 20%. BNP elevated. Chest xray with vascular congestion.  No chest pain.  -lasix as noted above -continue home BB -monitor daily weight -monitor intake and output -appreciate cards assistance  #3. CKD. II creatinine 0.83 on admission.  -monitor urine output -bmet in am  #4. Diabetes. Diet controlled. A1c 6.1 47 days ago. Serum glucose 224 on admission -monitor -SSI -carb modified diet  #5. Hypertension. Poor control. Home meds include amlodipine and dig and lasix and coreg and losartan. -holding dig -other meds as noted above   Code Status: full Family Communication: patient Disposition Plan: home when ready   Consultants:  harwani cards  Procedures:    Antibiotics:    HPI/Subjective: Awake alert mild increased work of breathing. Denies pain/discomfort but needs to void  Objective: Vitals:   11/25/18 1100 11/25/18 1230  BP: (!) 155/90  (!) 147/95  Pulse: 96 81  Resp:    Temp:    SpO2: 93% 98%    Intake/Output Summary (Last 24 hours) at 11/25/2018 1312 Last data filed at 11/25/2018 [id number] Gross per 24 hour  Intake -  Output 1700 ml  Net -1700 ml   Filed Weights   11/24/18 1615  Weight: 62.1 kg    Exam:   General:  Awake alert mild increased work of breathing no acute distress  Cardiovascular: rrr no mgr 1+ bilateral pitting edema  Respiratory: mild increased work of breathing with activity. Audible wheeze, bilateral crackles bases  Abdomen: obese soft +BS no guarding or rebounding  Musculoskeletal: joints without swelling/erythema   Data Reviewed: Basic Metabolic Panel: Recent Labs  Lab 11/19/18 1643 11/24/18 1603 11/25/18 0254 11/25/18 0355  NA 141 137  --   --   K 4.8 4.6  --   --   CL 101 104  --   --   CO2 26 24  --   --   GLUCOSE 128* 224*  --   --   BUN 20 11  --   --   CREATININE 1.11* 0.83  --   --   CALCIUM 9.0 8.3*  --   --   MG 2.6*  --  2.2 2.3  PHOS  --   --  3.5  --    Liver Function Tests: Recent Labs  Lab 11/24/18 1603  AST 30  ALT 36  ALKPHOS 48  BILITOT 0.7  PROT 7.4  ALBUMIN 3.8   No results for input(s): LIPASE, AMYLASE in the last 168 hours. No results for input(s): AMMONIA in the last 168 hours. CBC:  Recent Labs  Lab 11/24/18 1603 11/25/18 0355  WBC 9.3 9.1  NEUTROABS 5.6  --   HGB 13.2 13.7  HCT 42.6 42.6  MCV 87.8 85.7  PLT 900* 772*   Cardiac Enzymes: No results for input(s): CKTOTAL, CKMB, CKMBINDEX, TROPONINI in the last 168 hours. BNP (last 3 results) Recent Labs    10/08/18 1526 11/19/18 1643 11/24/18 1603  BNP 600.6* 868.8* 1,991.1*    ProBNP (last 3 results) No results for input(s): PROBNP in the last 8760 hours.  CBG: No results for input(s): GLUCAP in the last 168 hours.  Recent Results (from the past 240 hour(s))  SARS CORONAVIRUS 2 (TAT 6-24 HRS) Nasopharyngeal Nasopharyngeal Swab     Status: None   Collection Time:  11/24/18  6:21 PM   Specimen: Nasopharyngeal Swab  Result Value Ref Range Status   SARS Coronavirus 2 NEGATIVE NEGATIVE Final    Comment: (NOTE) SARS-CoV-2 target nucleic acids are NOT DETECTED. The SARS-CoV-2 RNA is generally detectable in upper and lower respiratory specimens during the acute phase of infection. Negative results do not preclude SARS-CoV-2 infection, do not rule out co-infections with other pathogens, and should not be used as the sole basis for treatment or other patient management decisions. Negative results must be combined with clinical observations, patient history, and epidemiological information. The expected result is Negative. Fact Sheet for Patients: SugarRoll.be Fact Sheet for Healthcare Providers: https://www.woods-mathews.com/ This test is not yet approved or cleared by the [place] FDA and  has been authorized for detection and/or diagnosis of SARS-CoV-2 by FDA under an Emergency Use Authorization (EUA). This EUA will remain  in effect (meaning this test can be used) for the duration of the COVID-19 declaration under Section 56 4(b)(1) of the Act, 21 U.S.C. section 360bbb-3(b)(1), unless the authorization is terminated or revoked sooner. Performed at [facility], [place] [address], [place], [place] [address]      Studies: Dg Chest Portable 1 View  Result Date: 11/24/2018 CLINICAL DATA:  Shortness of breath EXAM: PORTABLE CHEST 1 VIEW COMPARISON:  07/28/2018, CT 10/16/2015 FINDINGS: Small bilateral pleural effusions. Marked cardiomegaly with vascular congestion and diffuse interstitial edema. Patchy atelectasis or pneumonia at the bases. Aortic atherosclerosis. No pneumothorax. IMPRESSION: 1. Cardiomegaly with vascular congestion, interstitial pulmonary edema and small pleural effusions 2. Airspace disease at the left greater than right lung base, atelectasis versus pneumonia. Electronically Signed    By: Donavan Foil M.D.   On: 11/24/2018 16:28   Dg Abd Portable 1v  Result Date: 11/24/2018 CLINICAL DATA:  Abdominal distension. Epigastric pain. EXAM: PORTABLE ABDOMEN - 1 VIEW COMPARISON:  CT 03/05/2018 FINDINGS: Gaseous distention of colonic segment in the central lower abdomen/pelvis, up to 10 cm. Air throughout the remainder of normal caliber colon. No evidence of small bowel obstruction or free air. Pessary in place. Cardiomegaly with interstitial coarsening at the lung bases. No acute osseous abnormalities are seen. Scoliosis and degenerative change in the spine. IMPRESSION: 1. Gaseous distention of colonic segment in the central lower abdomen/pelvis possibly sigmoid colon based on prior CT. Recommend CT for further evaluation. 2. Cardiomegaly. Electronically Signed   By: Keith Rake M.D.   On: 11/24/2018 23:47    Scheduled Meds: . carvedilol  12.5 mg Oral BID WC  . enoxaparin (LOVENOX) injection  40 mg Subcutaneous Q24H  . furosemide  40 mg Intravenous BID  . levalbuterol  1.25 mg Nebulization TID  . metoCLOPramide (REGLAN) injection  5 mg Intravenous Q6H  . rosuvastatin  10 mg Oral Daily  . sacubitril-valsartan  1 tablet Oral BID   Continuous Infusions:  Principal Problem:   Acute respiratory failure with hypoxia (HCC) Active Problems:   Acute on chronic combined systolic (congestive) and diastolic (congestive) heart failure (HCC)   Type 2 diabetes mellitus with stage 2 chronic kidney disease, without long-term current use of insulin (HCC)   Chronic renal disease, stage II    Time spent: 60 min    [facility] M NP  [facility]  If 7PM-7AM, please contact night-coverage at [email or web address], password [facility] 11/25/2018, 1:12 PM  LOS: 1 day

## 2018-11-25 NOTE — ED Notes (Signed)
Pt c/o stomach pain 9/10. Admitting team paged for meds.

## 2018-11-25 NOTE — ED Notes (Signed)
hospital bed ordered

## 2018-11-26 DIAGNOSIS — E876 Hypokalemia: Secondary | ICD-10-CM | POA: Diagnosis not present

## 2018-11-26 DIAGNOSIS — I509 Heart failure, unspecified: Secondary | ICD-10-CM

## 2018-11-26 LAB — CBC
HCT: 43.7 % (ref 36.0–46.0)
Hemoglobin: 14.1 g/dL (ref 12.0–15.0)
MCH: 27 pg (ref 26.0–34.0)
MCHC: 32.3 g/dL (ref 30.0–36.0)
MCV: 83.7 fL (ref 80.0–100.0)
Platelets: 826 10*3/uL — ABNORMAL HIGH (ref 150–400)
RBC: 5.22 MIL/uL — ABNORMAL HIGH (ref 3.87–5.11)
RDW: 16.9 % — ABNORMAL HIGH (ref 11.5–15.5)
WBC: 8.3 10*3/uL (ref 4.0–10.5)
nRBC: 0 % (ref 0.0–0.2)

## 2018-11-26 LAB — BASIC METABOLIC PANEL
Anion gap: 13 (ref 5–15)
BUN: 11 mg/dL (ref 8–23)
CO2: 28 mmol/L (ref 22–32)
Calcium: 7.9 mg/dL — ABNORMAL LOW (ref 8.9–10.3)
Chloride: 96 mmol/L — ABNORMAL LOW (ref 98–111)
Creatinine, Ser: 0.84 mg/dL (ref 0.44–1.00)
GFR calc Af Amer: >60 mL/min (ref >60)
GFR calc non Af Amer: 59 mL/min — ABNORMAL LOW (ref >60)
Glucose, Bld: 106 mg/dL — ABNORMAL HIGH (ref 70–99)
Potassium: 3 mmol/L — ABNORMAL LOW (ref 3.5–5.1)
Sodium: 137 mmol/L (ref 135–145)

## 2018-11-26 LAB — GLUCOSE, CAPILLARY
Glucose-Capillary: 115 mg/dL — ABNORMAL HIGH (ref 70–99)
Glucose-Capillary: 129 mg/dL — ABNORMAL HIGH (ref 70–99)
Glucose-Capillary: 120 mg/dL — ABNORMAL HIGH (ref 70–99)
Glucose-Capillary: 136 mg/dL — ABNORMAL HIGH (ref 70–99)

## 2018-11-26 MED ORDER — FUROSEMIDE 10 MG/ML IJ SOLN
40.0000 mg | Freq: Every day | INTRAMUSCULAR | Status: DC
  Administered 2018-11-27: 40 mg via INTRAVENOUS
  Filled 2018-11-26: qty 4

## 2018-11-26 MED ORDER — POTASSIUM CHLORIDE CRYS ER 20 MEQ PO TBCR
40.0000 meq | EXTENDED_RELEASE_TABLET | Freq: Every day | ORAL | Status: DC
  Administered 2018-11-27: 40 meq via ORAL
  Filled 2018-11-26: qty 2

## 2018-11-26 MED ORDER — LEVALBUTEROL HCL 1.25 MG/0.5ML IN NEBU
1.2500 mg | INHALATION_SOLUTION | Freq: Two times a day (BID) | RESPIRATORY_TRACT | Status: DC
  Administered 2018-11-26 – 2018-11-27 (×2): 1.25 mg via RESPIRATORY_TRACT
  Filled 2018-11-26 (×2): qty 0.5

## 2018-11-26 MED ORDER — POTASSIUM CHLORIDE CRYS ER 20 MEQ PO TBCR
40.0000 meq | EXTENDED_RELEASE_TABLET | Freq: Once | ORAL | Status: AC
  Administered 2018-11-26: 40 meq via ORAL
  Filled 2018-11-26: qty 2

## 2018-11-26 MED ORDER — SACUBITRIL-VALSARTAN 97-103 MG PO TABS
1.0000 | ORAL_TABLET | Freq: Two times a day (BID) | ORAL | Status: DC
  Administered 2018-11-26 – 2018-11-27 (×3): 1 via ORAL
  Filled 2018-11-26 (×4): qty 1

## 2018-11-26 NOTE — Progress Notes (Signed)
Subjective:  Patient denies any chest pain, and states breathing is markedly improved. Leg swelling also improved  Objective:  Vital Signs in the last 24 hours: Temp:  [97.7 F (36.5 C)-98.7 F (37.1 C)] 98.4 F (36.9 C) (11/19 1221) Pulse Rate:  [64-81] 67 (11/19 1221) Resp:  [16-20] 20 (11/19 1221) BP: (104-156)/(75-97) 135/92 (11/19 1221) SpO2:  [92 %-100 %] 100 % (11/19 1221) Weight:  [56.2 kg-56.7 kg] 56.2 kg (11/19 0500)  Intake/Output from previous day: 11/18 0701 - 11/19 0700 In: 340 [P.O.:340] Out: 1200 [Urine:1200] Intake/Output from this shift: Total I/O In: -  Out: 600 [Urine:600]  Physical Exam: Neck: no adenopathy, no carotid bruit, no JVD and supple, symmetrical, trachea midline Lungs: decreased breath sound at bases with faint crackles.  Air entry markedly improved Heart: regular rate and rhythm, S1, S2 normal and 2/6 systolic murmur noted Abdomen: soft, non-tender; bowel sounds normal; no masses,  no organomegaly Extremities: no clubbing, cyanosis.  Trace edema noted  Lab Results: Recent Labs    11/25/18 0355 11/26/18 0447  WBC 9.1 8.3  HGB 13.7 14.1  PLT 772* 826*   Recent Labs    11/24/18 1603 11/26/18 0447  NA 137 137  K 4.6 3.0*  CL 104 96*  CO2 24 28  GLUCOSE 224* 106*  BUN 11 11  CREATININE 0.83 0.84   No results for input(s): TROPONINI in the last 72 hours.  Invalid input(s): CK, MB Hepatic Function Panel Recent Labs    11/24/18 1603  PROT 7.4  ALBUMIN 3.8  AST 30  ALT 36  ALKPHOS 48  BILITOT 0.7   No results for input(s): CHOL in the last 72 hours. No results for input(s): PROTIME in the last 72 hours.  Imaging: Imaging results have been reviewed and Dg Chest Portable 1 View  Result Date: 11/24/2018 CLINICAL DATA:  Shortness of breath EXAM: PORTABLE CHEST 1 VIEW COMPARISON:  07/28/2018, CT 10/16/2015 FINDINGS: Small bilateral pleural effusions. Marked cardiomegaly with vascular congestion and diffuse interstitial edema.  Patchy atelectasis or pneumonia at the bases. Aortic atherosclerosis. No pneumothorax. IMPRESSION: 1. Cardiomegaly with vascular congestion, interstitial pulmonary edema and small pleural effusions 2. Airspace disease at the left greater than right lung base, atelectasis versus pneumonia. Electronically Signed   By: Donavan Foil M.D.   On: 11/24/2018 16:28   Dg Abd Portable 1v  Result Date: 11/24/2018 CLINICAL DATA:  Abdominal distension. Epigastric pain. EXAM: PORTABLE ABDOMEN - 1 VIEW COMPARISON:  CT 03/05/2018 FINDINGS: Gaseous distention of colonic segment in the central lower abdomen/pelvis, up to 10 cm. Air throughout the remainder of normal caliber colon. No evidence of small bowel obstruction or free air. Pessary in place. Cardiomegaly with interstitial coarsening at the lung bases. No acute osseous abnormalities are seen. Scoliosis and degenerative change in the spine. IMPRESSION: 1. Gaseous distention of colonic segment in the central lower abdomen/pelvis possibly sigmoid colon based on prior CT. Recommend CT for further evaluation. 2. Cardiomegaly. Electronically Signed   By: Keith Rake M.D.   On: 11/24/2018 23:47    Cardiac Studies:  Assessment/Plan:  Resolving Acute on chronic Decompensated systolic/diastolic congestive heart failure Acute respiratory failure secondary to above.  Rule out pneumonia/bronchitis Dilated cardiomyopathy. Valvular heart disease with severe mitral regurgitation. Hypertension. Diabetes mellitus. Hyperlipidemia. Osteoarthritis. Hypokalemia. Thrombocytosis questionable etiology. Plan Increase Entresto as per orders. Reduce Lasix as per orders. Replace K Check labs in a.m.  LOS: 2 days    Mariah Aguilar 11/26/2018, 12:36 PM

## 2018-11-26 NOTE — Progress Notes (Signed)
Rehab Admissions Coordinator Note:  Per PT recommendation, this patient was screened by Raechel Ache for appropriateness for an Inpatient Acute Rehab Consult.  Note per chart, pt wants to return home. Pt does not appear to have a diagnosis amenable to IP Rehab at this time. Anticipate she will continue to progress well with acute therapies as her medical needs resolve.   Raechel Ache 11/26/2018, 5:50 PM  I can be reached at [phone].

## 2018-11-26 NOTE — Evaluation (Signed)
Physical Therapy Evaluation Patient Details Name: Mariah Aguilar MRN: [id number] DOB: Jun 04, 1922 Today's Date: 11/26/2018   History of Present Illness  85 yo female with onset of SOB and acute respiratory failure was Covid (-), but noted atelectasis, CHF, and cardiomegaly.  Wants to go directly home with family to assist her but is alone at times.  PMHx:  cecal CA, atherosclerosis, DM, CKD, HTN, EF 20-25%, thrombocytosis, L BBB, CKD 2  Clinical Impression  Pt was up to side of bed to use BSC but not to walk far with PT.  Her efforts were initially encouraging as she walked HHA on side of bed for several short walks on room air but remained at 95% sat.  After this pt was unable to agree to walk away from the bed, and replaced O2 as nursing was not immediately available to get approval to leave O2 off pt.  Follow acutely for strength and balance, and work toward a faster transition home with extended family and HHPT.    Follow Up Recommendations CIR    Equipment Recommendations  None recommended by PT(allow decision in rehab)    Recommendations for Other Services Rehab consult     Precautions / Restrictions Precautions Precautions: Fall Restrictions Weight Bearing Restrictions: No      Mobility  Bed Mobility Overal bed mobility: Needs Assistance Bed Mobility: Supine to Sit;Sit to Supine     Supine to sit: Min assist Sit to supine: Min assist   General bed mobility comments: min assist to support trunk and min to assist legs to bed  Transfers Overall transfer level: Needs assistance Equipment used: Rolling walker (2 wheeled);1 person hand held assist Transfers: Sit to/from Stand Sit to Stand: Min assist         General transfer comment: min assist to support and power up but then min guard  Ambulation/Gait Ambulation/Gait assistance: Min guard;Min assist Gait Distance (Feet): 5 Feet Assistive device: 1 person hand held assist Gait Pattern/deviations: Step-to  pattern;Decreased stride length;Wide base of support Gait velocity: reduced   General Gait Details: Pt is up to walk with assist, but declines to go far and is off O2 with maintenance of sats  Stairs            Wheelchair Mobility    Modified Rankin (Stroke Patients Only)       Balance Overall balance assessment: Needs assistance Sitting-balance support: Feet supported Sitting balance-Leahy Scale: Fair Sitting balance - Comments: fair sit balance and less than fair standing balance   Standing balance support: Bilateral upper extremity supported;During functional activity Standing balance-Leahy Scale: Poor Standing balance comment: requires an AD but will not consider this right now                             Pertinent Vitals/Pain Pain Assessment: No/denies pain    Home Living Family/patient expects to be discharged to:: Private residence Living Arrangements: Other relatives Available Help at Discharge: Family Type of Home: House Home Access: Level entry     Home Layout: One level Home Equipment: None      Prior Function Level of Independence: Independent         Comments: lives alone with family at [place]        Extremity/Trunk Assessment   Upper Extremity Assessment Upper Extremity Assessment: Generalized weakness    Lower Extremity Assessment Lower Extremity Assessment: Generalized weakness    Cervical /  Trunk Assessment Cervical / Trunk Assessment: Kyphotic  Communication   Communication: No difficulties  Cognition Arousal/Alertness: Awake/alert Behavior During Therapy: Impulsive Overall Cognitive Status: No family/caregiver present to determine baseline cognitive functioning                                 General Comments: has some concerning memory changes and has limited history to tell PT      General Comments General comments (skin integrity, edema, etc.): Pt requires help but does  not necessarily need O2.  Her sats were stable wiht room air but declines to let PT give her an AD.  Will need to resolve this for home    Exercises     Assessment/Plan    PT Assessment Patient needs continued PT services  PT Problem List Decreased strength;Decreased range of motion;Decreased activity tolerance;Decreased balance;Decreased mobility;Decreased coordination;Cardiopulmonary status limiting activity       PT Treatment Interventions DME instruction;Gait training;Stair training;Functional mobility training;Therapeutic activities;Therapeutic exercise;Balance training;Neuromuscular re-education;Patient/family education    PT Goals (Current goals can be found in the Care Plan section)  Acute Rehab PT Goals Patient Stated Goal: to go home and not start using an AD PT Goal Formulation: With patient Time For Goal Achievement: 12/10/18 Potential to Achieve Goals: Good    Frequency Min 3X/week   Barriers to discharge Decreased caregiver support home alone with instability to walk and aversive to using an AD    Co-evaluation               AM-PAC PT "6 Clicks" Mobility  Outcome Measure Help needed turning from your back to your side while in a flat bed without using bedrails?: None Help needed moving from lying on your back to sitting on the side of a flat bed without using bedrails?: A Little Help needed moving to and from a bed to a chair (including a wheelchair)?: A Little Help needed standing up from a chair using your arms (e.g., wheelchair or bedside chair)?: A Little Help needed to walk in hospital room?: A Little Help needed climbing 3-5 steps with a railing? : A Lot 6 Click Score: 18    End of Session Equipment Utilized During Treatment: Gait belt;Oxygen Activity Tolerance: Patient tolerated treatment well;Patient limited by fatigue;Other (comment)(cognitive changes?) Patient left: in bed;with call bell/phone within reach;with bed alarm set Nurse  Communication: Mobility status PT Visit Diagnosis: Unsteadiness on feet (R26.81);Muscle weakness (generalized) (M62.81);Difficulty in walking, not elsewhere classified (R26.2)    Time: [id number] PT Time Calculation (min) (ACUTE ONLY): 24 min   Charges:   PT Evaluation $PT Eval Moderate Complexity: 1 Mod PT Treatments $Gait Training: 8-22 mins       Ramond Dial 11/26/2018, 4:50 PM   Mee Hives, PT MS Acute Rehab Dept. Number: [place] [facility] [place]

## 2018-11-26 NOTE — Progress Notes (Signed)
[facility] PROGRESS NOTE  Mirissa BAILLIE NASON [id number] DOB: [birth year] DOA: 11/24/2018 PCP: Glendale Chard, MD  Assessment/Plan:  #1. Acute respiratory failure with hypoxia secondary to acute on chronic combined diastolic/systolic HF. bnp [id number]. Chest xray with vascular congestion. Oxygen saturation level reprotedly 88% on 2L [place]. She does not wear oxygen at home. BS with fine crackles and continues with increased oxygen demand. Volume status -970ml.  -continue lasix 40mg  IV bid -oxygen supplementation, but will attempt to wean -monitor oxygen saturation level. -wean oxygen as able -mobilize  #2. Acute on chronic combined diastolic/systolic heart failure. Likely related to fact that she decreased her home lasix dose.  Echo 07/2018 with EF 20%. BNP elevated. Chest xray with vascular congestion.  No chest pain.  -lasix as noted above -continue home BB -monitor daily weight -monitor intake and output -appreciate cards assistance  #3. CKD. II creatinine 0.83 on admission. Trending up slightly this am. Likely related to lasix IV -hold nephrotoxins as able.  -monitor urine output -bmet in am  #4. Diabetes. Diet controlled. A1c 6.1 47 days ago. Serum glucose 224 on admission -monitor -SSI -carb modified diet  #5. Hypertension. BP high end of normal. Home meds include amlodipine and dig and lasix and coreg and losartan. -holding dig -lasix IV as noted, continue coreg.  -amlodipine discontinued per cards -holding losartan  Code Status: full Family Communication: grandaughter on phone Disposition Plan: home when ready   Consultants:  harwani  Procedures:     HPI/Subjective: Lying in bed watching tv. Reports feeling better and announces she would like to go home. Denies pain/discomfort  Objective: Vitals:   11/26/18 0330 11/26/18 0744  BP: 104/75 (!) 156/86  Pulse: 73 81  Resp: 20 20  Temp: 98.7 F (37.1 C) 98.3 F (36.8 C)  SpO2: 92% 94%     Intake/Output Summary (Last 24 hours) at 11/26/2018 1210 Last data filed at 11/26/2018 1044 Gross per 24 hour  Intake 340 ml  Output 1100 ml  Net -760 ml   Filed Weights   11/24/18 1615 11/25/18 1324 11/26/18 0500  Weight: 62.1 kg 56.7 kg 56.2 kg    Exam:   General:  Awake alert no acute distress  Cardiovascular: rrr no mgr no LE edema  Respiratory: normal effort BS with fine crackles bilateral bases no wheeze  Abdomen: non-distended non-tender to palpation +BS   Musculoskeletal: joints without swelling/erythema   Data Reviewed: Basic Metabolic Panel: Recent Labs  Lab 11/19/18 1643 11/24/18 1603 11/25/18 0254 11/25/18 0355 11/26/18 0447  NA 141 137  --   --  137  K 4.8 4.6  --   --  3.0*  CL 101 104  --   --  96*  CO2 26 24  --   --  28  GLUCOSE 128* 224*  --   --  106*  BUN 20 11  --   --  11  CREATININE 1.11* 0.83  --   --  0.84  CALCIUM 9.0 8.3*  --   --  7.9*  MG 2.6*  --  2.2 2.3  --   PHOS  --   --  3.5  --   --    Liver Function Tests: Recent Labs  Lab 11/24/18 1603  AST 30  ALT 36  ALKPHOS 48  BILITOT 0.7  PROT 7.4  ALBUMIN 3.8   No results for input(s): LIPASE, AMYLASE in the last 168 hours. No results for input(s): AMMONIA in the last 168 hours. CBC: Recent Labs  Lab 11/24/18 1603 11/25/18 0355 11/26/18 0447  WBC 9.3 9.1 8.3  NEUTROABS 5.6  --   --   HGB 13.2 13.7 14.1  HCT 42.6 42.6 43.7  MCV 87.8 85.7 83.7  PLT 900* 772* 826*   Cardiac Enzymes: No results for input(s): CKTOTAL, CKMB, CKMBINDEX, TROPONINI in the last 168 hours. BNP (last 3 results) Recent Labs    10/08/18 1526 11/19/18 1643 11/24/18 1603  BNP 600.6* 868.8* 1,991.1*    ProBNP (last 3 results) No results for input(s): PROBNP in the last 8760 hours.  CBG: Recent Labs  Lab 11/25/18 1558 11/25/18 2116 11/26/18 0618 11/26/18 1057  GLUCAP 132* 103* 115* 129*    Recent Results (from the past 240 hour(s))  SARS CORONAVIRUS 2 (TAT 6-24 HRS)  Nasopharyngeal Nasopharyngeal Swab     Status: None   Collection Time: 11/24/18  6:21 PM   Specimen: Nasopharyngeal Swab  Result Value Ref Range Status   SARS Coronavirus 2 NEGATIVE NEGATIVE Final    Comment: (NOTE) SARS-CoV-2 target nucleic acids are NOT DETECTED. The SARS-CoV-2 RNA is generally detectable in upper and lower respiratory specimens during the acute phase of infection. Negative results do not preclude SARS-CoV-2 infection, do not rule out co-infections with other pathogens, and should not be used as the sole basis for treatment or other patient management decisions. Negative results must be combined with clinical observations, patient history, and epidemiological information. The expected result is Negative. Fact Sheet for Patients: SugarRoll.be Fact Sheet for Healthcare Providers: https://www.woods-mathews.com/ This test is not yet approved or cleared by the [place] FDA and  has been authorized for detection and/or diagnosis of SARS-CoV-2 by FDA under an Emergency Use Authorization (EUA). This EUA will remain  in effect (meaning this test can be used) for the duration of the COVID-19 declaration under Section 56 4(b)(1) of the Act, 21 U.S.C. section 360bbb-3(b)(1), unless the authorization is terminated or revoked sooner. Performed at [facility], [place] [address] [place], [place] [address]      Studies: Dg Chest Portable 1 View  Result Date: 11/24/2018 CLINICAL DATA:  Shortness of breath EXAM: PORTABLE CHEST 1 VIEW COMPARISON:  07/28/2018, CT 10/16/2015 FINDINGS: Small bilateral pleural effusions. Marked cardiomegaly with vascular congestion and diffuse interstitial edema. Patchy atelectasis or pneumonia at the bases. Aortic atherosclerosis. No pneumothorax. IMPRESSION: 1. Cardiomegaly with vascular congestion, interstitial pulmonary edema and small pleural effusions 2. Airspace disease at the left greater than  right lung base, atelectasis versus pneumonia. Electronically Signed   By: Donavan Foil M.D.   On: 11/24/2018 16:28   Dg Abd Portable 1v  Result Date: 11/24/2018 CLINICAL DATA:  Abdominal distension. Epigastric pain. EXAM: PORTABLE ABDOMEN - 1 VIEW COMPARISON:  CT 03/05/2018 FINDINGS: Gaseous distention of colonic segment in the central lower abdomen/pelvis, up to 10 cm. Air throughout the remainder of normal caliber colon. No evidence of small bowel obstruction or free air. Pessary in place. Cardiomegaly with interstitial coarsening at the lung bases. No acute osseous abnormalities are seen. Scoliosis and degenerative change in the spine. IMPRESSION: 1. Gaseous distention of colonic segment in the central lower abdomen/pelvis possibly sigmoid colon based on prior CT. Recommend CT for further evaluation. 2. Cardiomegaly. Electronically Signed   By: Keith Rake M.D.   On: 11/24/2018 23:47    Scheduled Meds: . carvedilol  12.5 mg Oral BID WC  . enoxaparin (LOVENOX) injection  40 mg Subcutaneous Q24H  . furosemide  40 mg Intravenous BID  . insulin aspart  0-6 Units Subcutaneous TID WC  . levalbuterol  1.25 mg Nebulization BID  . [START ON 11/27/2018] potassium chloride  40 mEq Oral Daily  . rosuvastatin  10 mg Oral Daily  . sacubitril-valsartan  1 tablet Oral BID   Continuous Infusions:  Principal Problem:   Acute respiratory failure with hypoxia (HCC) Active Problems:   Acute on chronic combined systolic (congestive) and diastolic (congestive) heart failure (HCC)   Type 2 diabetes mellitus with stage 2 chronic kidney disease, without long-term current use of insulin (HCC)   Chronic renal disease, stage II   Hypokalemia    Time spent: 40 minutes    [facility]  If 7PM-7AM, please contact night-coverage at [email or web address], password [facility] 11/26/2018, 12:10 PM  LOS: 2 days

## 2018-11-27 ENCOUNTER — Encounter (HOSPITAL_COMMUNITY): Payer: Self-pay | Admitting: General Practice

## 2018-11-27 LAB — BASIC METABOLIC PANEL
Anion gap: 12 (ref 5–15)
BUN: 14 mg/dL (ref 8–23)
CO2: 26 mmol/L (ref 22–32)
Calcium: 8.3 mg/dL — ABNORMAL LOW (ref 8.9–10.3)
Chloride: 99 mmol/L (ref 98–111)
Creatinine, Ser: 0.89 mg/dL (ref 0.44–1.00)
GFR calc Af Amer: >60 mL/min (ref >60)
GFR calc non Af Amer: 55 mL/min — ABNORMAL LOW (ref >60)
Glucose, Bld: 99 mg/dL (ref 70–99)
Potassium: 3.3 mmol/L — ABNORMAL LOW (ref 3.5–5.1)
Sodium: 137 mmol/L (ref 135–145)

## 2018-11-27 LAB — GLUCOSE, CAPILLARY
Glucose-Capillary: 112 mg/dL — ABNORMAL HIGH (ref 70–99)
Glucose-Capillary: 141 mg/dL — ABNORMAL HIGH (ref 70–99)

## 2018-11-27 MED ORDER — FUROSEMIDE 40 MG PO TABS: 20.0000 mg | ORAL_TABLET | Freq: Two times a day (BID) | ORAL | 1 refills | Status: AC

## 2018-11-27 MED ORDER — SACUBITRIL-VALSARTAN 97-103 MG PO TABS: 1.0000 | ORAL_TABLET | Freq: Two times a day (BID) | ORAL | 1 refills | Status: DC

## 2018-11-27 MED ORDER — POTASSIUM CHLORIDE CRYS ER 20 MEQ PO TBCR
40.0000 meq | EXTENDED_RELEASE_TABLET | Freq: Once | ORAL | Status: AC
  Administered 2018-11-27: 40 meq via ORAL
  Filled 2018-11-27: qty 2

## 2018-11-27 NOTE — Care Management Important Message (Signed)
Important Message  Patient Details  Name: Mariah Aguilar MRN: [id number] Date of Birth: 1922/06/15   Medicare Important Message Given:  Yes     [name] 11/27/2018, 1:42 PM

## 2018-11-27 NOTE — Discharge Summary (Signed)
Physician Discharge Summary  Mariah Aguilar [id number] DOB: Nov 22, 1922 DOA: 11/24/2018  PCP: Glendale Chard, MD  Admit date: 11/24/2018 Discharge date: 11/27/2018  Time spent: 45 minutes  Recommendations for Outpatient Follow-up:  1. Follow up with Dr Terrence Dupont 2-4 weeks for evaluation of volume status  2. Home health PT for strength and endurence   Discharge Diagnoses:  Principal Problem:   Acute respiratory failure with hypoxia (Mariah Aguilar) Active Problems:   Acute on chronic combined systolic (congestive) and diastolic (congestive) heart failure (HCC)   Type 2 diabetes mellitus with stage 2 chronic kidney disease, without long-term current use of insulin (HCC)   Chronic renal disease, stage II   Hypokalemia   Discharge Condition: stable  Diet recommendation: heart healthy  Filed Weights   11/25/18 1324 11/26/18 0500 11/27/18 0422  Weight: 56.7 kg 56.2 kg 55.7 kg    History of present illness:  Patient is a 85 year old African-American female with past medical history significant for congestive heart failure with EF of 20 to 25%, left bundle branch block, hypertension, hyperlipidemia, chronic thrombocytosis and arthritis.  Patient presented 11/17 with 2-day history of progressive worsening shortness of breath, dyspnea on exertion with leg edema.  No fever or chills, no cough or phlegm production.  No headache, no neck pain, no chest pain, no URI symptoms or urinary symptoms.  Presentation to the hospital, cardiac BNP was elevated at 1991.  Chest x-ray said to reveal cardiomegaly with vascular congestion, interstitial pulmonary edema and small pleural effusion.  Airspace disease was also reported, left greater than right lung base, concerning for atelectasis versus pneumonia (reviewing prior chest x-ray done on July 28, 2018, bibasilar lung findings may not be new).  COVID-19 test is pending.  Hospital Course:   #1. Acute respiratory failure with hypoxia secondary to acute on  chronic combined diastolic/systolic HF. Resolved at discharge.  bnp 1996. Chest xray with vascular congestion. Oxygen saturation level reprotedly 88% on 2L [place]. She does not wear oxygen at home. she received IV lasix with good results. At discharge ambulated in hall with sats 95% on room air.   #2. Acute on chronic combined diastolic/systolic heart failure. Likely related to fact that she decreased her home lasix dose. Echo 07/2018 with EF 20%. BNP elevated. Chest xray with vascular congestion. No chest pain. Evaluated by cardiology who opine dilated cardiomyopathy as well as acute on chronic systolic/diastolic HF and recommended stopping amlodipine and avapro and starting Entresto. Recommended lasix 40mg  BID po at discharge with follow up in 2-4 weeks. Weight at discharge 122.7  #3. CKD. II creatinine 0.83 on admission and 0.89 at discharge.   #4. Diabetes. Diet controlled. A1c 6.1 47 days ago. Serum glucose 224 on admission. CBG 141 at discharge  #5. Hypertension. BP high end of normal initially. Home meds include amlodipine and lasix and coreg and losartan.medication changes as noted above. BP 144/88 at discharge.  Procedures:    Consultations:  Dr Terrence Dupont cardiology  Discharge Exam: Vitals:   11/27/18 0857 11/27/18 1153  BP: 132/82 140/88  Pulse: 77 75  Resp: 17 19  Temp: 97.7 F (36.5 C) 98.2 F (36.8 C)  SpO2: 98% 97%    General: sitting on side of bed eating no acute distress Cardiovascular: rrr no mgr no LE edema Respiratory: normal effort BS slightly diminished but good air movement no crackles  Discharge Instructions   Discharge Instructions    (HEART FAILURE PATIENTS) Call MD:  Anytime you have any of the following symptoms: 1) 3 pound  weight gain in 24 hours or 5 pounds in 1 week 2) shortness of breath, with or without a dry hacking cough 3) swelling in the hands, feet or stomach 4) if you have to sleep on extra pillows at night in order to breathe.   Complete by:  As directed    Call MD for:  difficulty breathing, headache or visual disturbances   Complete by: As directed    Call MD for:  temperature >100.4   Complete by: As directed    Diet - low sodium heart healthy   Complete by: As directed    Heart Failure patients record your daily weight using the same scale at the same time of day   Complete by: As directed    Increase activity slowly   Complete by: As directed    STOP any activity that causes chest pain, shortness of breath, dizziness, sweating, or exessive weakness   Complete by: As directed      Allergies as of 11/27/2018      Reactions   Shrimp [shellfish Allergy] Itching, Swelling   Penicillins Hives   Did it involve swelling of the face/tongue/throat, SOB, or low BP? Unknown Did it involve sudden or severe rash/hives, skin peeling, or any reaction on the inside of your mouth or nose? Unknown Did you need to seek medical attention at a hospital or doctor's office? Unknown When did it last happen? If all above answers are "NO", may proceed with cephalosporin use.      Medication List    STOP taking these medications   amLODipine-valsartan 10-320 MG tablet Commonly known as: EXFORGE     TAKE these medications   AeroChamber Plus inhaler Use as instructed   carvedilol 12.5 MG tablet Commonly known as: COREG Take 12.5 mg by mouth 2 (two) times daily with a meal.   Dexilant 60 MG capsule Generic drug: dexlansoprazole TAKE 1 CAPSULE BY MOUTH EVERY DAY What changed: how much to take   diazepam 5 MG tablet Commonly known as: VALIUM Take 2.5 mg by mouth at bedtime as needed for sedation.   furosemide 40 MG tablet Commonly known as: LASIX Take 0.5-1 tablets (20-40 mg total) by mouth 2 (two) times daily. Take 40mg  in the morning and 20mg  at lunch. What changed: when to take this   guaiFENesin 600 MG 12 hr tablet Commonly known as: MUCINEX Take 1 tablet (600 mg total) by mouth 2 (two) times daily. What changed:    when to take this  reasons to take this   ibuprofen 600 MG tablet Commonly known as: ADVIL Take 600 mg by mouth daily as needed for moderate pain.   NIFEREX-150 FORTE PO Take 150 mg by mouth daily.   potassium chloride 8 MEQ CR tablet Commonly known as: KLOR-CON Take 8 mEq by mouth daily.   rosuvastatin 10 MG tablet Commonly known as: CRESTOR Take 10 mg by mouth daily.   sacubitril-valsartan 97-103 MG Commonly known as: ENTRESTO Take 1 tablet by mouth 2 (two) times daily.   traMADol 50 MG tablet Commonly known as: ULTRAM Take 50 mg by mouth as needed for moderate pain.   Ventolin HFA 108 (90 Base) MCG/ACT inhaler Generic drug: albuterol TAKE 2 PUFFS BY MOUTH EVERY 6 HOURS AS NEEDED FOR WHEEZE OR SHORTNESS OF BREATH What changed: See the new instructions.      Allergies  Allergen Reactions  . Shrimp [Shellfish Allergy] Itching and Swelling  . Penicillins Hives    Did it involve swelling of  the face/tongue/throat, SOB, or low BP? Unknown Did it involve sudden or severe rash/hives, skin peeling, or any reaction on the inside of your mouth or nose? Unknown Did you need to seek medical attention at a hospital or doctor's office? Unknown When did it last happen? If all above answers are "NO", may proceed with cephalosporin use.      The results of significant diagnostics from this hospitalization (including imaging, microbiology, ancillary and laboratory) are listed below for reference.    Significant Diagnostic Studies: Dg Chest Portable 1 View  Result Date: 11/24/2018 CLINICAL DATA:  Shortness of breath EXAM: PORTABLE CHEST 1 VIEW COMPARISON:  07/28/2018, CT 10/16/2015 FINDINGS: Small bilateral pleural effusions. Marked cardiomegaly with vascular congestion and diffuse interstitial edema. Patchy atelectasis or pneumonia at the bases. Aortic atherosclerosis. No pneumothorax. IMPRESSION: 1. Cardiomegaly with vascular congestion, interstitial pulmonary edema  and small pleural effusions 2. Airspace disease at the left greater than right lung base, atelectasis versus pneumonia. Electronically Signed   By: Donavan Foil M.D.   On: 11/24/2018 16:28   Dg Abd Portable 1v  Result Date: 11/24/2018 CLINICAL DATA:  Abdominal distension. Epigastric pain. EXAM: PORTABLE ABDOMEN - 1 VIEW COMPARISON:  CT 03/05/2018 FINDINGS: Gaseous distention of colonic segment in the central lower abdomen/pelvis, up to 10 cm. Air throughout the remainder of normal caliber colon. No evidence of small bowel obstruction or free air. Pessary in place. Cardiomegaly with interstitial coarsening at the lung bases. No acute osseous abnormalities are seen. Scoliosis and degenerative change in the spine. IMPRESSION: 1. Gaseous distention of colonic segment in the central lower abdomen/pelvis possibly sigmoid colon based on prior CT. Recommend CT for further evaluation. 2. Cardiomegaly. Electronically Signed   By: Keith Rake M.D.   On: 11/24/2018 23:47    Microbiology: Recent Results (from the past 240 hour(s))  SARS CORONAVIRUS 2 (TAT 6-24 HRS) Nasopharyngeal Nasopharyngeal Swab     Status: None   Collection Time: 11/24/18  6:21 PM   Specimen: Nasopharyngeal Swab  Result Value Ref Range Status   SARS Coronavirus 2 NEGATIVE NEGATIVE Final    Comment: (NOTE) SARS-CoV-2 target nucleic acids are NOT DETECTED. The SARS-CoV-2 RNA is generally detectable in upper and lower respiratory specimens during the acute phase of infection. Negative results do not preclude SARS-CoV-2 infection, do not rule out co-infections with other pathogens, and should not be used as the sole basis for treatment or other patient management decisions. Negative results must be combined with clinical observations, patient history, and epidemiological information. The expected result is Negative. Fact Sheet for Patients: SugarRoll.be Fact Sheet for Healthcare  Providers: https://www.woods-mathews.com/ This test is not yet approved or cleared by the [place] FDA and  has been authorized for detection and/or diagnosis of SARS-CoV-2 by FDA under an Emergency Use Authorization (EUA). This EUA will remain  in effect (meaning this test can be used) for the duration of the COVID-19 declaration under Section 56 4(b)(1) of the Act, 21 U.S.C. section 360bbb-3(b)(1), unless the authorization is terminated or revoked sooner. Performed at [facility], [place] [address], [place], [place] [address]      Labs: Basic Metabolic Panel: Recent Labs  Lab 11/24/18 1603 11/25/18 0254 11/25/18 0355 11/26/18 0447 11/27/18 0412  NA 137  --   --  137 137  K 4.6  --   --  3.0* 3.3*  CL 104  --   --  96* 99  CO2 24  --   --  28 26  GLUCOSE 224*  --   --  106* 99  BUN 11  --   --  11 14  CREATININE 0.83  --   --  0.84 0.89  CALCIUM 8.3*  --   --  7.9* 8.3*  MG  --  2.2 2.3  --   --   PHOS  --  3.5  --   --   --    Liver Function Tests: Recent Labs  Lab 11/24/18 1603  AST 30  ALT 36  ALKPHOS 48  BILITOT 0.7  PROT 7.4  ALBUMIN 3.8   No results for input(s): LIPASE, AMYLASE in the last 168 hours. No results for input(s): AMMONIA in the last 168 hours. CBC: Recent Labs  Lab 11/24/18 1603 11/25/18 0355 11/26/18 0447  WBC 9.3 9.1 8.3  NEUTROABS 5.6  --   --   HGB 13.2 13.7 14.1  HCT 42.6 42.6 43.7  MCV 87.8 85.7 83.7  PLT 900* 772* 826*   Cardiac Enzymes: No results for input(s): CKTOTAL, CKMB, CKMBINDEX, TROPONINI in the last 168 hours. BNP: BNP (last 3 results) Recent Labs    10/08/18 1526 11/19/18 1643 11/24/18 1603  BNP 600.6* 868.8* 1,991.1*    ProBNP (last 3 results) No results for input(s): PROBNP in the last 8760 hours.  CBG: Recent Labs  Lab 11/26/18 0618 11/26/18 1057 11/26/18 1549 11/26/18 2140 11/27/18 0627  GLUCAP 115* 129* 120* 136* 112*       Signed:  Radene Gunning NP [facility] 11/27/2018, 12:20 PM

## 2018-11-27 NOTE — Progress Notes (Signed)
Subjective:  Doing well.  Denies any chest pain or shortness of breath.  Objective:  Vital Signs in the last 24 hours: Temp:  [97.7 F (36.5 C)-99.6 F (37.6 C)] 97.7 F (36.5 C) (11/20 0857) Pulse Rate:  [64-79] 77 (11/20 0857) Resp:  [16-20] 17 (11/20 0857) BP: (117-151)/(59-92) 132/82 (11/20 0857) SpO2:  [92 %-100 %] 98 % (11/20 0857) Weight:  [55.7 kg] 55.7 kg (11/20 0422)  Intake/Output from previous day: 11/19 0701 - 11/20 0700 In: 340 [P.O.:340] Out: 600 [Urine:600] Intake/Output from this shift: Total I/O In: 360 [P.O.:360] Out: 750 [Urine:750]  Physical Exam: Neck: no adenopathy, no carotid bruit, no JVD and supple, symmetrical, trachea midline Lungs: faint rhonchi at left base.  Air entry is improved Heart: regular rate and rhythm, S1, S2 normal and 2/6 systolic murmur noted Abdomen: soft, non-tender; bowel sounds normal; no masses,  no organomegaly Extremities: extremities normal, atraumatic, no cyanosis or edema  Lab Results: Recent Labs    11/25/18 0355 11/26/18 0447  WBC 9.1 8.3  HGB 13.7 14.1  PLT 772* 826*   Recent Labs    11/26/18 0447 11/27/18 0412  NA 137 137  K 3.0* 3.3*  CL 96* 99  CO2 28 26  GLUCOSE 106* 99  BUN 11 14  CREATININE 0.84 0.89   No results for input(s): TROPONINI in the last 72 hours.  Invalid input(s): CK, MB Hepatic Function Panel Recent Labs    11/24/18 1603  PROT 7.4  ALBUMIN 3.8  AST 30  ALT 36  ALKPHOS 48  BILITOT 0.7   No results for input(s): CHOL in the last 72 hours. No results for input(s): PROTIME in the last 72 hours.  Imaging: Imaging results have been reviewed and No results found.  Cardiac Studies:  Assessment/Plan:  Resolving Acute on chronic Decompensated systolic/diastolic congestive heart failure Acute respiratory failure secondary to above. Rule out pneumonia/bronchitis Dilated cardiomyopathy. Valvular heart disease with severe mitral regurgitation. Hypertension. Diabetes  mellitus. Hyperlipidemia. Osteoarthritis. Hypokalemia. Thrombocytosis questionable etiology. Plan Continue present management. Increase ambulation as tolerated. May need home O2 if O2 sats drops below 88% on room air. Follow-up with me in one to 2 weeks. Heart failure instructions  LOS: 3 days    Mariah Aguilar 11/27/2018, 11:32 AM

## 2018-11-27 NOTE — Plan of Care (Signed)

## 2018-11-27 NOTE — Discharge Instructions (Signed)
D/c weight: 122.7 kg  Heart Failure, Self Care Heart failure is a serious condition. This sheet explains things you need to do to take care of yourself at home. To help you stay as healthy as possible, you may be asked to change your diet, take certain medicines, and make other changes in your life. Your doctor may also give you more specific instructions. If you have problems or questions, call your doctor. What are the risks? Having heart failure makes it more likely for you to have some problems. These problems can get worse if you do not take good care of yourself. Problems may include:  Blood clotting problems. This may cause a stroke.  Damage to the kidneys, liver, or lungs.  Abnormal heart rhythms. Supplies needed:  Scale for weighing yourself.  Blood pressure monitor.  Notebook.  Medicines. How to care for yourself when you have heart failure Medicines Take over-the-counter and prescription medicines only as told by your doctor. Take your medicines every day.  Do not stop taking your medicine unless your doctor tells you to do so.  Do not skip any medicines.  Get your prescriptions refilled before you run out of medicine. This is important. Eating and drinking   Eat heart-healthy foods. Talk with a diet specialist (dietitian) to create an eating plan.  Choose foods that: ? Have no trans fat. ? Are low in saturated fat and cholesterol.  Choose healthy foods, such as: ? Fresh or frozen fruits and vegetables. ? Fish. ? Low-fat (lean) meats. ? Legumes, such as beans, peas, and lentils. ? Fat-free or low-fat dairy products. ? Whole-grain foods. ? High-fiber foods.  Limit salt (sodium) if told by your doctor. Ask your diet specialist to tell you which seasonings are healthy for your heart.  Cook in healthy ways instead of frying. Healthy ways of cooking include roasting, grilling, broiling, baking, poaching, steaming, and stir-frying.  Limit how much fluid you  drink, if told by your doctor. Alcohol use  Do not drink alcohol if: ? Your doctor tells you not to drink. ? Your heart was damaged by alcohol, or you have very bad heart failure. ? You are pregnant, may be pregnant, or are planning to become pregnant.  If you drink alcohol: ? Limit how much you use to:  0-1 drink a day for women.  0-2 drinks a day for men. ? Be aware of how much alcohol is in your drink. In the [place] one drink equals one 12 oz bottle of beer (355 mL), one 5 oz glass of wine (148 mL), or one 1 oz glass of hard liquor (44 mL). Lifestyle   Do not use any products that contain nicotine or tobacco, such as cigarettes, e-cigarettes, and chewing tobacco. If you need help quitting, ask your doctor. ? Do not use nicotine gum or patches before talking to your doctor.  Do not use illegal drugs.  Lose weight if told by your doctor.  Do physical activity if told by your doctor. Talk to your doctor before you begin an exercise if: ? You are an older adult. ? You have very bad heart failure.  Learn to manage stress. If you need help, ask your doctor.  Get rehab (rehabilitation) to help you stay independent and to help with your quality of life.  Plan time to rest when you get tired. Check weight and blood pressure   Weigh yourself every day. This will help you to know if fluid is building up in your body. ?  Weigh yourself every morning after you pee (urinate) and before you eat breakfast. ? Wear the same amount of clothing each time. ? Write down your daily weight. Give your record to your doctor.  Check and write down your blood pressure as told by your doctor.  Check your pulse as told by your doctor. Dealing with very hot and very cold weather  If it is very hot: ? Avoid activities that take a lot of energy. ? Use air conditioning or fans, or find a cooler place. ? Avoid caffeine and alcohol. ? Wear clothing that is loose-fitting, lightweight, and  light-colored.  If it is very cold: ? Avoid activities that take a lot of energy. ? Layer your clothes. ? Wear mittens or gloves, a hat, and a scarf when you go outside. ? Avoid alcohol. Follow these instructions at home:  Stay up to date with shots (vaccines). Get pneumococcal and flu (influenza) shots.  Keep all follow-up visits as told by your doctor. This is important. Contact a doctor if:  You gain weight quickly.  You have increasing shortness of breath.  You cannot do your normal activities.  You get tired easily.  You cough a lot.  You don't feel like eating or feel like you may vomit (nauseous).  You become puffy (swell) in your hands, feet, ankles, or belly (abdomen).  You cannot sleep well because it is hard to breathe.  You feel like your heart is beating fast (palpitations).  You get dizzy when you stand up. Get help right away if:  You have trouble breathing.  You or someone else notices a change in your behavior, such as having trouble staying awake.  You have chest pain or discomfort.  You pass out (faint). These symptoms may be an emergency. Do not wait to see if the symptoms will go away. Get medical help right away. Call your local emergency services (911 in the [place] Do not drive yourself to the hospital. Summary  Heart failure is a serious condition. To care for yourself, you may have to change your diet, take medicines, and make other lifestyle changes.  Take your medicines every day. Do not stop taking them unless your doctor tells you to do so.  Eat heart-healthy foods, such as fresh or frozen fruits and vegetables, fish, lean meats, legumes, fat-free or low-fat dairy products, and whole-grain or high-fiber foods.  Ask your doctor if you can drink alcohol. You may have to stop alcohol use if you have very bad heart failure.  Contact your doctor if you gain weight quickly or feel that your heart is beating too fast. Get help right away if  you pass out, or have chest pain or trouble breathing. This information is not intended to replace advice given to you by your health care provider. Make sure you discuss any questions you have with your health care provider. Document Released: 04/08/2018 Document Revised: 04/07/2018 Document Reviewed: 04/08/2018 Elsevier Patient Education  2020 [employer].

## 2018-11-27 NOTE — Progress Notes (Signed)
Physical Therapy Treatment Patient Details Name: Mariah Aguilar MRN: [id number] DOB: 11-Apr-1922 Today's Date: 11/27/2018    History of Present Illness Pt is a 85 y.o. female admitted 11/24/18 with SOB and acute respiratory failure; (-) COVID-19. Noted atelectasis, CHF, and cardiomegaly. PMH includes cecal CA, atherosclerosis, DM, CKD, HTN, EF 20-25%, thrombocytosis, LBBB, CKD 2.   PT Comments    Pt progressing with mobility, motivated to participate and return home. Today's session focused on LE strengthening. Pt continues to require consistent UE support to maintain balance with standing mobility, remains adamantly against use of any assistive device for stability. Reports 'furniture surfing' at home and HHA from family with community ambulation. SpO2 94% on RA. Pt pleasantly declining recommendation for HHPT services. Will continue to follow acutely.   Follow Up Recommendations  Home health PT;Supervision/Assistance - 24 hour(declining HHPT)     Equipment Recommendations  None recommended by PT    Recommendations for Other Services       Precautions / Restrictions Precautions Precautions: Fall Restrictions Weight Bearing Restrictions: No    Mobility  Bed Mobility Overal bed mobility: Modified Independent Bed Mobility: Supine to Sit;Sit to Supine              Transfers Overall transfer level: Needs assistance Equipment used: None;1 person hand held assist Transfers: Sit to/from Stand Sit to Stand: Supervision         General transfer comment: Performed multiple sit<>stands from varying surface heights, initial min guard/HHA progressing to supervision  Ambulation/Gait Ambulation/Gait assistance: Min guard;Min assist Gait Distance (Feet): 80 Feet Assistive device: 1 person hand held assist   Gait velocity: Decreased Gait velocity interpretation: <1.31 ft/sec, indicative of household ambulator General Gait Details: Ambulating multiple laps in room (declined  hallway ambulation) with HHA (min guard/minA), pt also reaching to furniture for consistent support of other UE; continues to decline use of DME "until I need it." despite education on need. SpO2 94% on RA   Stairs             Wheelchair Mobility    Modified Rankin (Stroke Patients Only)       Balance Overall balance assessment: Needs assistance Sitting-balance support: Feet supported Sitting balance-Leahy Scale: Good       Standing balance-Leahy Scale: Fair Standing balance comment: Can static stand without UE support, reliant on UE support for dynamic tasks                            Cognition Arousal/Alertness: Awake/alert Behavior During Therapy: WFL for tasks assessed/performed Overall Cognitive Status: No family/caregiver present to determine baseline cognitive functioning Area of Impairment: Attention;Memory;Safety/judgement                   Current Attention Level: Selective Memory: Decreased short-term memory   Safety/Judgement: Decreased awareness of safety            Exercises Other Exercises Other Exercises: Repeated sit<>stands (5x with UE support on bed, 5x with UE support on knees, reliant on assist for trials without any UE support)    General Comments        Pertinent Vitals/Pain Pain Assessment: No/denies pain    Home Living                      Prior Function            PT Goals (current goals can now be found in the care plan section)  Progress towards PT goals: Progressing toward goals    Frequency    Min 3X/week      PT Plan Discharge plan needs to be updated    Co-evaluation              AM-PAC PT "6 Clicks" Mobility   Outcome Measure  Help needed turning from your back to your side while in a flat bed without using bedrails?: None Help needed moving from lying on your back to sitting on the side of a flat bed without using bedrails?: None Help needed moving to and from a bed to a  chair (including a wheelchair)?: A Little Help needed standing up from a chair using your arms (e.g., wheelchair or bedside chair)?: A Little Help needed to walk in hospital room?: A Little Help needed climbing 3-5 steps with a railing? : A Little 6 Click Score: 20    End of Session   Activity Tolerance: Patient tolerated treatment well Patient left: in bed;with call bell/phone within reach Nurse Communication: Mobility status PT Visit Diagnosis: Unsteadiness on feet (R26.81);Muscle weakness (generalized) (M62.81);Difficulty in walking, not elsewhere classified (R26.2)     Time: [id number] PT Time Calculation (min) (ACUTE ONLY): 29 min  Charges:  $Therapeutic Exercise: 23-37 mins                    Mabeline Caras, PT, DPT Acute Rehabilitation Services  Pager [phone] Office [place] 11/27/2018, 10:18 AM

## 2018-11-28 LAB — SPECIMEN STATUS REPORT

## 2018-11-28 LAB — TSH: TSH: 4.35 u[IU]/mL (ref 0.450–4.500)

## 2018-11-28 NOTE — TOC Progression Note (Signed)
Transition of Care [facility]) - Progression Note    Patient Details  Name: Mariah Aguilar MRN: [id number] Date of Birth: Apr 16, 1922  Transition of [facility]) CM/SW Contact  Zenon Mayo, RN Phone Number: 11/28/2018, 9:48 AM  Clinical Narrative:    Patient's Entresto is 3.90 per [facility].        Expected Discharge Plan and Services           Expected Discharge Date: 11/27/18                                     Social Determinants of Health (SDOH) Interventions    Readmission Risk Interventions No flowsheet data found.

## 2018-11-29 ENCOUNTER — Other Ambulatory Visit: Payer: Self-pay | Admitting: Internal Medicine

## 2018-11-29 NOTE — Progress Notes (Signed)
Subjective:     Patient ID: Mariah Aguilar , female    DOB: Jul 28, 1922 , 85 y.o.   MRN: [id number]   Chief Complaint  Patient presents with  . med check    patient presents today for a 4 week med check on lasix    HPI  She presents today for f/u lasix. She was advised to take second dose three days per week. She reports she has not been doing this for the past one or two weeks. She reports she was feeling bad, so she stopped taking it. She thinks it was making her heart rate too low. She has noticed that her heart rate is lower than usual and she has been feeling more fatigued. No chest pain.  She denies using salt on her foods. She denies orthopnea.     Past Medical History:  Diagnosis Date  . Arthritis   . Cardiomyopathy   . Cecal neoplasm   . CHF (congestive heart failure) ([place])   . Colon polyps   . Diverticulosis   . Hyperlipidemia   . Hypertension   . Insomnia   . Internal hemorrhoids   . Left bundle branch block (LBBB) on electrocardiogram    EKG of 5'12 shows 1st degree AV block/LBBB -Epic.     Family History  Problem Relation Age of Onset  . Cancer Mother        COLON  . Stroke Father   . Diabetes Sister      Current Outpatient Medications:  .  carvedilol (COREG) 12.5 MG tablet, Take 12.5 mg by mouth 2 (two) times daily with a meal. , Disp: , Rfl: 3 .  DEXILANT 60 MG capsule, TAKE 1 CAPSULE BY MOUTH EVERY DAY (Patient taking differently: Take 60 mg by mouth daily. ), Disp: 30 capsule, Rfl: 1 .  diazepam (VALIUM) 5 MG tablet, Take 2.5 mg by mouth at bedtime as needed for sedation. , Disp: , Rfl: 1 .  furosemide (LASIX) 40 MG tablet, Take 0.5-1 tablets (20-40 mg total) by mouth 2 (two) times daily. Take 46m in the morning and 240mat lunch., Disp: 30 tablet, Rfl: 1 .  guaiFENesin (MUCINEX) 600 MG 12 hr tablet, Take 1 tablet (600 mg total) by mouth 2 (two) times daily. (Patient taking differently: Take 600 mg by mouth 2 (two) times daily as needed for cough or  to loosen phlegm. ), Disp: 20 tablet, Rfl: 0 .  ibuprofen (ADVIL) 600 MG tablet, Take 600 mg by mouth daily as needed for moderate pain. , Disp: , Rfl:  .  Iron Polysacch Cmplx-B12-FA (NIFEREX-150 FORTE PO), Take 150 mg by mouth daily. , Disp: , Rfl:  .  potassium chloride (KLOR-CON) 8 MEQ CR tablet, Take 8 mEq by mouth daily.  , Disp: , Rfl:  .  rosuvastatin (CRESTOR) 10 MG tablet, Take 10 mg by mouth daily.  , Disp: , Rfl:  .  sacubitril-valsartan (ENTRESTO) 97-103 MG, Take 1 tablet by mouth 2 (two) times daily., Disp: 60 tablet, Rfl: 1 .  Spacer/Aero-Holding Chambers (AEROCHAMBER PLUS) inhaler, Use as instructed, Disp: 1 each, Rfl: 2 .  traMADol (ULTRAM) 50 MG tablet, Take 50 mg by mouth as needed for moderate pain. , Disp: , Rfl: 0 .  VENTOLIN HFA 108 (90 Base) MCG/ACT inhaler, TAKE 2 PUFFS BY MOUTH EVERY 6 HOURS AS NEEDED FOR WHEEZE OR SHORTNESS OF BREATH (Patient taking differently: Inhale 2 puffs into the lungs every 6 (six) hours as needed for wheezing or shortness of  breath. ), Disp: 18 g, Rfl: 2   Allergies  Allergen Reactions  . Shrimp [Shellfish Allergy] Itching and Swelling  . Penicillins Hives    Did it involve swelling of the face/tongue/throat, SOB, or low BP? Unknown Did it involve sudden or severe rash/hives, skin peeling, or any reaction on the inside of your mouth or nose? Unknown Did you need to seek medical attention at a hospital or doctor's office? Unknown When did it last happen? If all above answers are "NO", may proceed with cephalosporin use.     Review of Systems  Constitutional: Positive for fatigue.  Respiratory: Negative.   Cardiovascular: Negative.        Slow heart rate  Gastrointestinal: Negative.   Neurological: Negative.   Psychiatric/Behavioral: Negative.      Today's Vitals   11/19/18 1552  BP: (!) 118/58  Pulse: (!) 40  Temp: 98.7 F (37.1 C)  TempSrc: Oral  Weight: 137 lb (62.1 kg)  Height: 4' 11"  (1.499 m)   Body mass index is  27.67 kg/m.   Objective:  Physical Exam Vitals signs and nursing note reviewed.  Constitutional:      Appearance: Normal appearance.  HENT:     Head: Normocephalic and atraumatic.  Cardiovascular:     Rate and Rhythm: Normal rate and regular rhythm.     Heart sounds: Normal heart sounds.  Pulmonary:     Effort: Pulmonary effort is normal. No respiratory distress.     Breath sounds: Wheezing present.  Musculoskeletal:     Right lower leg: 2+ Pitting Edema present.     Left lower leg: 2+ Pitting Edema present.  Skin:    General: Skin is warm.  Neurological:     General: No focal deficit present.     Mental Status: She is alert.  Psychiatric:        Mood and Affect: Mood normal.        Behavior: Behavior normal.         Assessment And Plan:     1. Hypertensive heart and renal disease with renal failure, stage 1 through stage 4 or unspecified chronic kidney disease, with heart failure (HCC)  Chronic. I will check labs as listed below. Pt advised that magnesium supplementation would likely help to improve her fatigue. Importance of medication and dietary compliance was discussed with the patient. I will make further recommendations once her labs are available for review.   - Brain natriuretic peptide (44010) - BMP8+EGFR - Magnesium  2. Chronic combined systolic and diastolic heart failure  Chronic.   3. Symptomatic bradycardia  EKG performed, marked bradycardia present. This is the likely reason for her worsening fatigue (along with decompensated heart failure).  Pt advised that her digoxin level needs to be checked.  Pt advised that her dose may need to be adjusted.  She does report taking this on a regular basis. EKG and lab results will be faxed to her cardiologist, Dr. Terrence Dupont.   - EKG 12-Lead - TSH  4. Drug therapy  - Digoxin  5.  Wheezing  She was given nebulizer to use at home as needed. She was given samples of albuterol - to only use 1/2 vial twice daily  as needed. She and her granddaughter understand to call me in a day or two to let me know how she is doing.   Maximino Greenland, MD    THE PATIENT IS ENCOURAGED TO PRACTICE SOCIAL DISTANCING DUE TO THE COVID-19 PANDEMIC.

## 2018-11-30 ENCOUNTER — Telehealth: Payer: Self-pay

## 2018-11-30 NOTE — Telephone Encounter (Signed)
Left vm for pt to return call. Need to perform tcm call following d/c from hospital. followup appt should be made for December 4

## 2018-12-01 ENCOUNTER — Ambulatory Visit: Payer: Self-pay

## 2018-12-01 DIAGNOSIS — E876 Hypokalemia: Secondary | ICD-10-CM

## 2018-12-01 DIAGNOSIS — E1122 Type 2 diabetes mellitus with diabetic chronic kidney disease: Secondary | ICD-10-CM

## 2018-12-01 DIAGNOSIS — I700 Atherosclerosis of aorta: Secondary | ICD-10-CM

## 2018-12-01 DIAGNOSIS — I130 Hypertensive heart and chronic kidney disease with heart failure and stage 1 through stage 4 chronic kidney disease, or unspecified chronic kidney disease: Secondary | ICD-10-CM

## 2018-12-01 NOTE — Chronic Care Management (AMB) (Signed)
  Chronic Care Management   Outreach Note  12/01/2018 Name: Mariah Aguilar MRN: [id number] DOB: [birth year]  Referred by: Natividad Brood [facility] Liaison Reason for referral : Care Coordination   An unsuccessful telephone outreach was attempted today. The patient was referred to the case management team by for assistance with care management and care coordination following recent hospitalization.  Follow Up Plan: A HIPPA compliant phone message was left for the patient providing contact information and requesting a return call.  The care management team will reach out to the patient again over the next 14 days.   Daneen Schick, BSW, CDP Social Worker, Certified Dementia Practitioner [place] [facility] [place] [facility] [phone]

## 2018-12-07 ENCOUNTER — Telehealth: Payer: Self-pay

## 2018-12-07 NOTE — Telephone Encounter (Signed)
Left vm for pt granddaughter to call back.  She is likely having trouble breathing due to chf. Why do they think she needs oxygen? that is for lung issues. Has she contacted the heart doctor? He may need to adjust her medications? Do they want a home health nurse? HAs TCM phone call been done? Does she have hospital f/u scheduled?

## 2018-12-08 ENCOUNTER — Emergency Department (HOSPITAL_COMMUNITY): Payer: Medicare Other

## 2018-12-08 ENCOUNTER — Encounter (HOSPITAL_COMMUNITY): Payer: Self-pay | Admitting: Emergency Medicine

## 2018-12-08 ENCOUNTER — Other Ambulatory Visit: Payer: Self-pay

## 2018-12-08 ENCOUNTER — Inpatient Hospital Stay (HOSPITAL_COMMUNITY)
Admission: EM | Admit: 2018-12-08 | Discharge: 2018-12-13 | DRG: 377 | Disposition: A | Discharge status: Home Health | Payer: Medicare Other | Attending: Internal Medicine | Admitting: Internal Medicine

## 2018-12-08 DIAGNOSIS — E1151 Type 2 diabetes mellitus with diabetic peripheral angiopathy without gangrene: Secondary | ICD-10-CM | POA: Diagnosis present

## 2018-12-08 DIAGNOSIS — E119 Type 2 diabetes mellitus without complications: Secondary | ICD-10-CM | POA: Diagnosis not present

## 2018-12-08 DIAGNOSIS — E876 Hypokalemia: Secondary | ICD-10-CM

## 2018-12-08 DIAGNOSIS — I110 Hypertensive heart disease with heart failure: Secondary | ICD-10-CM | POA: Diagnosis present

## 2018-12-08 DIAGNOSIS — N182 Chronic kidney disease, stage 2 (mild): Secondary | ICD-10-CM

## 2018-12-08 DIAGNOSIS — G92 Toxic encephalopathy: Secondary | ICD-10-CM | POA: Diagnosis not present

## 2018-12-08 DIAGNOSIS — Z20828 Contact with and (suspected) exposure to other viral communicable diseases: Secondary | ICD-10-CM | POA: Diagnosis present

## 2018-12-08 DIAGNOSIS — I5022 Chronic systolic (congestive) heart failure: Secondary | ICD-10-CM | POA: Diagnosis present

## 2018-12-08 DIAGNOSIS — I5023 Acute on chronic systolic (congestive) heart failure: Secondary | ICD-10-CM | POA: Diagnosis not present

## 2018-12-08 DIAGNOSIS — R404 Transient alteration of awareness: Secondary | ICD-10-CM | POA: Diagnosis not present

## 2018-12-08 DIAGNOSIS — I10 Essential (primary) hypertension: Secondary | ICD-10-CM | POA: Diagnosis not present

## 2018-12-08 DIAGNOSIS — J90 Pleural effusion, not elsewhere classified: Secondary | ICD-10-CM | POA: Diagnosis not present

## 2018-12-08 DIAGNOSIS — I442 Atrioventricular block, complete: Secondary | ICD-10-CM | POA: Diagnosis not present

## 2018-12-08 DIAGNOSIS — Z452 Encounter for adjustment and management of vascular access device: Secondary | ICD-10-CM

## 2018-12-08 DIAGNOSIS — R001 Bradycardia, unspecified: Secondary | ICD-10-CM | POA: Diagnosis not present

## 2018-12-08 DIAGNOSIS — K631 Perforation of intestine (nontraumatic): Secondary | ICD-10-CM | POA: Diagnosis not present

## 2018-12-08 DIAGNOSIS — T40605A Adverse effect of unspecified narcotics, initial encounter: Secondary | ICD-10-CM | POA: Diagnosis not present

## 2018-12-08 DIAGNOSIS — N170 Acute kidney failure with tubular necrosis: Secondary | ICD-10-CM | POA: Diagnosis not present

## 2018-12-08 DIAGNOSIS — E785 Hyperlipidemia, unspecified: Secondary | ICD-10-CM | POA: Diagnosis present

## 2018-12-08 DIAGNOSIS — I340 Nonrheumatic mitral (valve) insufficiency: Secondary | ICD-10-CM | POA: Diagnosis present

## 2018-12-08 DIAGNOSIS — R52 Pain, unspecified: Secondary | ICD-10-CM | POA: Diagnosis not present

## 2018-12-08 DIAGNOSIS — I472 Ventricular tachycardia: Secondary | ICD-10-CM | POA: Diagnosis present

## 2018-12-08 DIAGNOSIS — Z90710 Acquired absence of both cervix and uterus: Secondary | ICD-10-CM

## 2018-12-08 DIAGNOSIS — Z8719 Personal history of other diseases of the digestive system: Secondary | ICD-10-CM

## 2018-12-08 DIAGNOSIS — I5033 Acute on chronic diastolic (congestive) heart failure: Secondary | ICD-10-CM

## 2018-12-08 DIAGNOSIS — Z791 Long term (current) use of non-steroidal anti-inflammatories (NSAID): Secondary | ICD-10-CM

## 2018-12-08 DIAGNOSIS — I420 Dilated cardiomyopathy: Secondary | ICD-10-CM | POA: Diagnosis not present

## 2018-12-08 DIAGNOSIS — K262 Acute duodenal ulcer with both hemorrhage and perforation: Principal | ICD-10-CM | POA: Diagnosis present

## 2018-12-08 DIAGNOSIS — I5020 Unspecified systolic (congestive) heart failure: Secondary | ICD-10-CM | POA: Diagnosis not present

## 2018-12-08 DIAGNOSIS — J9602 Acute respiratory failure with hypercapnia: Secondary | ICD-10-CM | POA: Diagnosis not present

## 2018-12-08 DIAGNOSIS — N179 Acute kidney failure, unspecified: Secondary | ICD-10-CM

## 2018-12-08 DIAGNOSIS — K922 Gastrointestinal hemorrhage, unspecified: Secondary | ICD-10-CM | POA: Diagnosis not present

## 2018-12-08 DIAGNOSIS — G4700 Insomnia, unspecified: Secondary | ICD-10-CM | POA: Diagnosis present

## 2018-12-08 DIAGNOSIS — K219 Gastro-esophageal reflux disease without esophagitis: Secondary | ICD-10-CM | POA: Diagnosis present

## 2018-12-08 DIAGNOSIS — E11649 Type 2 diabetes mellitus with hypoglycemia without coma: Secondary | ICD-10-CM | POA: Diagnosis not present

## 2018-12-08 DIAGNOSIS — E875 Hyperkalemia: Secondary | ICD-10-CM | POA: Diagnosis not present

## 2018-12-08 DIAGNOSIS — R933 Abnormal findings on diagnostic imaging of other parts of digestive tract: Secondary | ICD-10-CM | POA: Diagnosis not present

## 2018-12-08 DIAGNOSIS — J811 Chronic pulmonary edema: Secondary | ICD-10-CM | POA: Diagnosis not present

## 2018-12-08 DIAGNOSIS — K224 Dyskinesia of esophagus: Secondary | ICD-10-CM | POA: Diagnosis not present

## 2018-12-08 DIAGNOSIS — M1990 Unspecified osteoarthritis, unspecified site: Secondary | ICD-10-CM | POA: Diagnosis not present

## 2018-12-08 DIAGNOSIS — Z880 Allergy status to penicillin: Secondary | ICD-10-CM

## 2018-12-08 DIAGNOSIS — J9601 Acute respiratory failure with hypoxia: Secondary | ICD-10-CM | POA: Diagnosis not present

## 2018-12-08 DIAGNOSIS — R1084 Generalized abdominal pain: Secondary | ICD-10-CM | POA: Diagnosis not present

## 2018-12-08 DIAGNOSIS — R570 Cardiogenic shock: Secondary | ICD-10-CM | POA: Diagnosis present

## 2018-12-08 DIAGNOSIS — K5730 Diverticulosis of large intestine without perforation or abscess without bleeding: Secondary | ICD-10-CM | POA: Diagnosis not present

## 2018-12-08 DIAGNOSIS — Z8249 Family history of ischemic heart disease and other diseases of the circulatory system: Secondary | ICD-10-CM

## 2018-12-08 DIAGNOSIS — K921 Melena: Secondary | ICD-10-CM | POA: Diagnosis present

## 2018-12-08 DIAGNOSIS — R4020 Unspecified coma: Secondary | ICD-10-CM | POA: Diagnosis not present

## 2018-12-08 DIAGNOSIS — I38 Endocarditis, valve unspecified: Secondary | ICD-10-CM | POA: Diagnosis not present

## 2018-12-08 DIAGNOSIS — Z9049 Acquired absence of other specified parts of digestive tract: Secondary | ICD-10-CM

## 2018-12-08 DIAGNOSIS — I447 Left bundle-branch block, unspecified: Secondary | ICD-10-CM | POA: Diagnosis present

## 2018-12-08 DIAGNOSIS — Z79899 Other long term (current) drug therapy: Secondary | ICD-10-CM

## 2018-12-08 DIAGNOSIS — Z823 Family history of stroke: Secondary | ICD-10-CM

## 2018-12-08 DIAGNOSIS — Z91013 Allergy to seafood: Secondary | ICD-10-CM

## 2018-12-08 DIAGNOSIS — E118 Type 2 diabetes mellitus with unspecified complications: Secondary | ICD-10-CM | POA: Diagnosis not present

## 2018-12-08 DIAGNOSIS — J9811 Atelectasis: Secondary | ICD-10-CM | POA: Diagnosis not present

## 2018-12-08 DIAGNOSIS — R7989 Other specified abnormal findings of blood chemistry: Secondary | ICD-10-CM | POA: Diagnosis not present

## 2018-12-08 DIAGNOSIS — I499 Cardiac arrhythmia, unspecified: Secondary | ICD-10-CM | POA: Diagnosis not present

## 2018-12-08 DIAGNOSIS — I4891 Unspecified atrial fibrillation: Secondary | ICD-10-CM | POA: Diagnosis not present

## 2018-12-08 DIAGNOSIS — I5189 Other ill-defined heart diseases: Secondary | ICD-10-CM

## 2018-12-08 DIAGNOSIS — E872 Acidosis: Secondary | ICD-10-CM | POA: Diagnosis present

## 2018-12-08 DIAGNOSIS — R0902 Hypoxemia: Secondary | ICD-10-CM | POA: Diagnosis not present

## 2018-12-08 DIAGNOSIS — Z833 Family history of diabetes mellitus: Secondary | ICD-10-CM

## 2018-12-08 DIAGNOSIS — Z03818 Encounter for observation for suspected exposure to other biological agents ruled out: Secondary | ICD-10-CM | POA: Diagnosis not present

## 2018-12-08 LAB — COMPREHENSIVE METABOLIC PANEL
ALT: 146 U/L — ABNORMAL HIGH (ref 0–44)
AST: 133 U/L — ABNORMAL HIGH (ref 15–41)
Albumin: 2.8 g/dL — ABNORMAL LOW (ref 3.5–5.0)
Alkaline Phosphatase: 52 U/L (ref 38–126)
Anion gap: 19 — ABNORMAL HIGH (ref 5–15)
BUN: 61 mg/dL — ABNORMAL HIGH (ref 8–23)
CO2: 18 mmol/L — ABNORMAL LOW (ref 22–32)
Calcium: 7.1 mg/dL — ABNORMAL LOW (ref 8.9–10.3)
Chloride: 97 mmol/L — ABNORMAL LOW (ref 98–111)
Creatinine, Ser: 3.57 mg/dL — ABNORMAL HIGH (ref 0.44–1.00)
GFR calc Af Amer: 12 mL/min — ABNORMAL LOW (ref >60)
GFR calc non Af Amer: 10 mL/min — ABNORMAL LOW (ref >60)
Glucose, Bld: 179 mg/dL — ABNORMAL HIGH (ref 70–99)
Potassium: 6.2 mmol/L — ABNORMAL HIGH (ref 3.5–5.1)
Sodium: 134 mmol/L — ABNORMAL LOW (ref 135–145)
Total Bilirubin: 0.5 mg/dL (ref 0.3–1.2)
Total Protein: 5.7 g/dL — ABNORMAL LOW (ref 6.5–8.1)

## 2018-12-08 LAB — URINALYSIS, ROUTINE W REFLEX MICROSCOPIC
Bilirubin Urine: NEGATIVE
Glucose, UA: 50 mg/dL — AB
Ketones, ur: NEGATIVE mg/dL
Leukocytes,Ua: NEGATIVE
Nitrite: NEGATIVE
Protein, ur: 100 mg/dL — AB
Specific Gravity, Urine: 1.015 (ref 1.005–1.030)
pH: 5 (ref 5.0–8.0)

## 2018-12-08 LAB — PROTIME-INR
INR: 1.6 — ABNORMAL HIGH (ref 0.8–1.2)
Prothrombin Time: 19.2 seconds — ABNORMAL HIGH (ref 11.4–15.2)

## 2018-12-08 LAB — POCT I-STAT EG7
Acid-base deficit: 10 mmol/L — ABNORMAL HIGH (ref 0.0–2.0)
Bicarbonate: 18 mmol/L — ABNORMAL LOW (ref 20.0–28.0)
Calcium, Ion: 0.88 mmol/L — CL (ref 1.15–1.40)
HCT: 38 % (ref 36.0–46.0)
Hemoglobin: 12.9 g/dL (ref 12.0–15.0)
O2 Saturation: 78 %
Potassium: 6 mmol/L — ABNORMAL HIGH (ref 3.5–5.1)
Sodium: 131 mmol/L — ABNORMAL LOW (ref 135–145)
TCO2: 19 mmol/L — ABNORMAL LOW (ref 22–32)
pCO2, Ven: 48.7 mmHg (ref 44.0–60.0)
pH, Ven: 7.176 — CL (ref 7.250–7.430)
pO2, Ven: 54 mmHg — ABNORMAL HIGH (ref 32.0–45.0)

## 2018-12-08 LAB — POCT I-STAT 7, (LYTES, BLD GAS, ICA,H+H)
Acid-Base Excess: 1 mmol/L (ref 0.0–2.0)
Acid-base deficit: 1 mmol/L (ref 0.0–2.0)
Bicarbonate: 27.4 mmol/L (ref 20.0–28.0)
Bicarbonate: 27 mmol/L (ref 20.0–28.0)
Calcium, Ion: 1.02 mmol/L — ABNORMAL LOW (ref 1.15–1.40)
Calcium, Ion: 1.04 mmol/L — ABNORMAL LOW (ref 1.15–1.40)
HCT: 37 % (ref 36.0–46.0)
HCT: 36 % (ref 36.0–46.0)
Hemoglobin: 12.6 g/dL (ref 12.0–15.0)
Hemoglobin: 12.2 g/dL (ref 12.0–15.0)
O2 Saturation: 100 %
O2 Saturation: 98 %
Patient temperature: 96.2
Patient temperature: 97.7
Potassium: 4.9 mmol/L (ref 3.5–5.1)
Potassium: 4.6 mmol/L (ref 3.5–5.1)
Sodium: 133 mmol/L — ABNORMAL LOW (ref 135–145)
Sodium: 133 mmol/L — ABNORMAL LOW (ref 135–145)
TCO2: 29 mmol/L (ref 22–32)
TCO2: 28 mmol/L (ref 22–32)
pCO2 arterial: 58.7 mmHg — ABNORMAL HIGH (ref 32.0–48.0)
pCO2 arterial: 47 mmHg (ref 32.0–48.0)
pH, Arterial: 7.269 — ABNORMAL LOW (ref 7.350–7.450)
pH, Arterial: 7.364 (ref 7.350–7.450)
pO2, Arterial: 268 mmHg — ABNORMAL HIGH (ref 83.0–108.0)
pO2, Arterial: 105 mmHg (ref 83.0–108.0)

## 2018-12-08 LAB — I-STAT CHEM 8, ED
BUN: 56 mg/dL — ABNORMAL HIGH (ref 8–23)
Calcium, Ion: 0.88 mmol/L — CL (ref 1.15–1.40)
Chloride: 100 mmol/L (ref 98–111)
Creatinine, Ser: 3.5 mg/dL — ABNORMAL HIGH (ref 0.44–1.00)
Glucose, Bld: 168 mg/dL — ABNORMAL HIGH (ref 70–99)
HCT: 39 % (ref 36.0–46.0)
Hemoglobin: 13.3 g/dL (ref 12.0–15.0)
Potassium: 6 mmol/L — ABNORMAL HIGH (ref 3.5–5.1)
Sodium: 132 mmol/L — ABNORMAL LOW (ref 135–145)
TCO2: 20 mmol/L — ABNORMAL LOW (ref 22–32)

## 2018-12-08 LAB — CBC WITH DIFFERENTIAL/PLATELET
Abs Immature Granulocytes: 0.09 10*3/uL — ABNORMAL HIGH (ref 0.00–0.07)
Basophils Absolute: 0.1 10*3/uL (ref 0.0–0.1)
Basophils Relative: 1 %
Eosinophils Absolute: 0.2 10*3/uL (ref 0.0–0.5)
Eosinophils Relative: 2 %
HCT: 38.3 % (ref 36.0–46.0)
Hemoglobin: 12.1 g/dL (ref 12.0–15.0)
Immature Granulocytes: 1 %
Lymphocytes Relative: 19 %
Lymphs Abs: 1.7 10*3/uL (ref 0.7–4.0)
MCH: 27.7 pg (ref 26.0–34.0)
MCHC: 31.6 g/dL (ref 30.0–36.0)
MCV: 87.6 fL (ref 80.0–100.0)
Monocytes Absolute: 0.9 10*3/uL (ref 0.1–1.0)
Monocytes Relative: 10 %
Neutro Abs: 6.1 10*3/uL (ref 1.7–7.7)
Neutrophils Relative %: 67 %
Platelets: 729 10*3/uL — ABNORMAL HIGH (ref 150–400)
RBC: 4.37 MIL/uL (ref 3.87–5.11)
RDW: 17.5 % — ABNORMAL HIGH (ref 11.5–15.5)
WBC: 9 10*3/uL (ref 4.0–10.5)
nRBC: 0 % (ref 0.0–0.2)

## 2018-12-08 LAB — BRAIN NATRIURETIC PEPTIDE: B Natriuretic Peptide: 2231.5 pg/mL — ABNORMAL HIGH (ref 0.0–100.0)

## 2018-12-08 LAB — CBC
HCT: 37.8 % (ref 36.0–46.0)
Hemoglobin: 12 g/dL (ref 12.0–15.0)
MCH: 27 pg (ref 26.0–34.0)
MCHC: 31.7 g/dL (ref 30.0–36.0)
MCV: 84.9 fL (ref 80.0–100.0)
Platelets: 733 10*3/uL — ABNORMAL HIGH (ref 150–400)
RBC: 4.45 MIL/uL (ref 3.87–5.11)
RDW: 17.5 % — ABNORMAL HIGH (ref 11.5–15.5)
WBC: 11.1 10*3/uL — ABNORMAL HIGH (ref 4.0–10.5)
nRBC: 0.2 % (ref 0.0–0.2)

## 2018-12-08 LAB — TYPE AND SCREEN
ABO/RH(D): O POS
Antibody Screen: NEGATIVE

## 2018-12-08 LAB — POC OCCULT BLOOD, ED: Fecal Occult Bld: POSITIVE — AB

## 2018-12-08 LAB — CBG MONITORING, ED
Glucose-Capillary: 93 mg/dL (ref 70–99)
Glucose-Capillary: 130 mg/dL — ABNORMAL HIGH (ref 70–99)

## 2018-12-08 LAB — TROPONIN I (HIGH SENSITIVITY)
Troponin I (High Sensitivity): 112 ng/L (ref <18)
Troponin I (High Sensitivity): 158 ng/L (ref <18)

## 2018-12-08 LAB — LACTIC ACID, PLASMA
Lactic Acid, Venous: 7.1 mmol/L (ref 0.5–1.9)
Lactic Acid, Venous: 3 mmol/L (ref 0.5–1.9)

## 2018-12-08 LAB — POC SARS CORONAVIRUS 2 AG -  ED: SARS Coronavirus 2 Ag: NEGATIVE

## 2018-12-08 LAB — GLUCOSE, CAPILLARY: Glucose-Capillary: 108 mg/dL — ABNORMAL HIGH (ref 70–99)

## 2018-12-08 LAB — LIPASE, BLOOD: Lipase: 20 U/L (ref 11–51)

## 2018-12-08 MED ORDER — GLUCAGON HCL RDNA (DIAGNOSTIC) 1 MG IJ SOLR: 5.0000 mg | Freq: Once | INTRAVENOUS | Status: DC

## 2018-12-08 MED ORDER — DEXTROSE 50 % IV SOLN
1.0000 | Freq: Once | INTRAVENOUS | Status: AC
  Administered 2018-12-08: 50 mL via INTRAVENOUS
  Filled 2018-12-08: qty 50

## 2018-12-08 MED ORDER — DOBUTAMINE IN D5W 4-5 MG/ML-% IV SOLN
2.5000 ug/kg/min | INTRAVENOUS | Status: DC
  Administered 2018-12-08: 2.5 ug/kg/min via INTRAVENOUS
  Filled 2018-12-08: qty 250

## 2018-12-08 MED ORDER — INSULIN HIGH DOSE BOLUS VIA INFUSION (FOR BETA BLOCKER / CALCIUM CHANNEL BLOCKER OVERDOSE)
1.0000 [IU]/kg | Freq: Once | INTRAVENOUS | Status: AC
  Administered 2018-12-08: 55.7 [IU] via INTRAVENOUS
  Filled 2018-12-08: qty 56

## 2018-12-08 MED ORDER — GLUCAGON HCL RDNA (DIAGNOSTIC) 1 MG IJ SOLR: 1.0000 mg/h | INTRAVENOUS | Status: DC

## 2018-12-08 MED ORDER — CALCIUM GLUCONATE-NACL 1-0.675 GM/50ML-% IV SOLN
1.0000 g | Freq: Once | INTRAVENOUS | Status: AC
  Administered 2018-12-08: 1000 mg via INTRAVENOUS
  Filled 2018-12-08: qty 50

## 2018-12-08 MED ORDER — DEXTROSE 50 % IV SOLN
0.5000 g/kg | Freq: Once | INTRAVENOUS | Status: AC | PRN
  Administered 2018-12-09: 27.85 g via INTRAVENOUS

## 2018-12-08 MED ORDER — DEXTROSE 10 % IV SOLN
5.0000 mL/kg/h | INTRAVENOUS | Status: DC
  Administered 2018-12-08 – 2018-12-09 (×2): 5 mL/kg/h via INTRAVENOUS
  Filled 2018-12-08 (×4): qty 1000

## 2018-12-08 MED ORDER — PANTOPRAZOLE SODIUM 40 MG IV SOLR: 40.0000 mg | Freq: Two times a day (BID) | INTRAVENOUS | Status: DC

## 2018-12-08 MED ORDER — METRONIDAZOLE IN NACL 5-0.79 MG/ML-% IV SOLN
500.0000 mg | Freq: Once | INTRAVENOUS | Status: AC
  Administered 2018-12-08: 500 mg via INTRAVENOUS
  Filled 2018-12-08: qty 100

## 2018-12-08 MED ORDER — STERILE WATER FOR INJECTION IV SOLN
INTRAVENOUS | Status: DC
  Administered 2018-12-08: 22:00:00 via INTRAVENOUS
  Filled 2018-12-08: qty 850

## 2018-12-08 MED ORDER — ATROPINE SULFATE 1 MG/ML IJ SOLN
INTRAMUSCULAR | Status: AC | PRN
  Administered 2018-12-08: .5 mg via INTRAVENOUS

## 2018-12-08 MED ORDER — NALOXONE HCL 0.4 MG/ML IJ SOLN: 0.4000 mg | INTRAMUSCULAR | Status: DC | PRN

## 2018-12-08 MED ORDER — METRONIDAZOLE IN NACL 5-0.79 MG/ML-% IV SOLN
500.0000 mg | Freq: Three times a day (TID) | INTRAVENOUS | Status: DC
  Administered 2018-12-09 – 2018-12-10 (×4): 500 mg via INTRAVENOUS
  Filled 2018-12-08 (×5): qty 100

## 2018-12-08 MED ORDER — CALCIUM GLUCONATE 10 % IV SOLN
1.0000 g | Freq: Once | INTRAVENOUS | Status: AC
  Administered 2018-12-08: 1 g via INTRAVENOUS
  Filled 2018-12-08: qty 10

## 2018-12-08 MED ORDER — FENTANYL CITRATE (PF) 100 MCG/2ML IJ SOLN
100.0000 ug | Freq: Once | INTRAMUSCULAR | Status: AC
  Administered 2018-12-08: 100 ug via INTRAVENOUS
  Filled 2018-12-08: qty 2

## 2018-12-08 MED ORDER — SODIUM CHLORIDE 0.9 % IV SOLN
1.0000 g | INTRAVENOUS | Status: DC
  Administered 2018-12-09: 1 g via INTRAVENOUS
  Filled 2018-12-08 (×2): qty 1

## 2018-12-08 MED ORDER — INSULIN ASPART 100 UNIT/ML IV SOLN
10.0000 [IU] | Freq: Once | INTRAVENOUS | Status: AC
  Administered 2018-12-08: 10 [IU] via INTRAVENOUS

## 2018-12-08 MED ORDER — SODIUM CHLORIDE 0.9 % IV SOLN
2.0000 g | Freq: Once | INTRAVENOUS | Status: AC
  Administered 2018-12-08: 2 g via INTRAVENOUS
  Filled 2018-12-08: qty 2

## 2018-12-08 MED ORDER — VANCOMYCIN HCL IN DEXTROSE 1-5 GM/200ML-% IV SOLN
1000.0000 mg | Freq: Once | INTRAVENOUS | Status: AC
  Administered 2018-12-08: 1000 mg via INTRAVENOUS
  Filled 2018-12-08: qty 200

## 2018-12-08 MED ORDER — SODIUM BICARBONATE 8.4 % IV SOLN
50.0000 meq | Freq: Once | INTRAVENOUS | Status: AC
  Administered 2018-12-08: 50 meq via INTRAVENOUS
  Filled 2018-12-08: qty 50

## 2018-12-08 MED ORDER — SODIUM BICARBONATE 8.4 % IV SOLN
INTRAVENOUS | Status: AC | PRN
  Administered 2018-12-08: 50 meq via INTRAVENOUS

## 2018-12-08 MED ORDER — PANTOPRAZOLE SODIUM 40 MG IV SOLR
40.0000 mg | Freq: Two times a day (BID) | INTRAVENOUS | Status: DC
  Administered 2018-12-08 – 2018-12-09 (×3): 40 mg via INTRAVENOUS
  Filled 2018-12-08 (×3): qty 40

## 2018-12-08 MED ORDER — NALOXONE HCL 0.4 MG/ML IJ SOLN
INTRAMUSCULAR | Status: AC
  Administered 2018-12-08: 0.4 mg
  Filled 2018-12-08: qty 1

## 2018-12-08 MED ORDER — SODIUM CHLORIDE 0.9 % IV SOLN
0.0000 [IU]/kg/h | INTRAVENOUS | Status: DC
  Administered 2018-12-08: 1 [IU]/kg/h via INTRAVENOUS
  Filled 2018-12-08 (×2): qty 10

## 2018-12-08 MED ORDER — EPINEPHRINE HCL 5 MG/250ML IV SOLN IN NS
2.5000 ug/min | INTRAVENOUS | Status: DC
  Administered 2018-12-08: 2.5 ug/min via INTRAVENOUS
  Administered 2018-12-09: 5 ug/min via INTRAVENOUS
  Filled 2018-12-08 (×2): qty 250

## 2018-12-08 NOTE — ED Notes (Signed)
Bair hugger applied due to rectal temp 95

## 2018-12-08 NOTE — ED Notes (Signed)
Ulla Gallo (Granddaughter#[phone])

## 2018-12-08 NOTE — Consult Note (Signed)
Reason for Consult:ct scan Referring Physician: Dr Domenic Schwab Mariah Aguilar is an 85 y.o. female.  HPI: 47 yof consult for ct findings.   Has history of CHF, DM among other issues. I am unable toget history from her. She is currently paced.  She has had some abd pain apparently for last couple days. No obstructive sx.  Unable to really find more history  Past Medical History:  Diagnosis Date  . Arthritis   . Cardiomyopathy   . Cecal neoplasm   . CHF (congestive heart failure) ([place])   . Colon polyps   . Diverticulosis   . Hyperlipidemia   . Hypertension   . Insomnia   . Internal hemorrhoids   . Left bundle branch block (LBBB) on electrocardiogram    EKG of 5'12 shows 1st degree AV block/LBBB -Epic.    Past Surgical History:  Procedure Laterality Date  . ABDOMINAL HYSTERECTOMY    . COLON SURGERY    . COLONOSCOPY  05/27/2011   Procedure: COLONOSCOPY;  Surgeon: Juanita Craver, MD;  Location: [facility];  Service: Endoscopy;  Laterality: N/A;  . COLONOSCOPY WITH PROPOFOL N/A 06/30/2014   Procedure: COLONOSCOPY WITH PROPOFOL;  Surgeon: Juanita Craver, MD;  Location: [facility];  Service: Endoscopy;  Laterality: N/A;    Family History  Problem Relation Age of Onset  . Cancer Mother        COLON  . Stroke Father   . Diabetes Sister     Social History:  reports that she has never smoked. She has never used smokeless tobacco. She reports that she does not drink alcohol or use drugs.  Allergies:  Allergies  Allergen Reactions  . Shrimp [Shellfish Allergy] Itching and Swelling  . Penicillins Hives    Did it involve swelling of the face/tongue/throat, SOB, or low BP? Unknown Did it involve sudden or severe rash/hives, skin peeling, or any reaction on the inside of your mouth or nose? Unknown Did you need to seek medical attention at a hospital or doctor's office? Unknown When did it last happen? If all above answers are "NO", may proceed with cephalosporin use.     Medications: I have reviewed the patient's current medications.  Results for orders placed or performed during the hospital encounter of 12/08/18 (from the past 48 hour(s))  CBC with Differential     Status: Abnormal   Collection Time: 12/08/18  5:55 PM  Result Value Ref Range   WBC 9.0 4.0 - 10.5 K/uL   RBC 4.37 3.87 - 5.11 MIL/uL   Hemoglobin 12.1 12.0 - 15.0 g/dL   HCT 38.3 36.0 - 46.0 %   MCV 87.6 80.0 - 100.0 fL   MCH 27.7 26.0 - 34.0 pg   MCHC 31.6 30.0 - 36.0 g/dL   RDW 17.5 (H) 11.5 - 15.5 %   Platelets 729 (H) 150 - 400 K/uL   nRBC 0.0 0.0 - 0.2 %   Neutrophils Relative % 67 %   Neutro Abs 6.1 1.7 - 7.7 K/uL   Lymphocytes Relative 19 %   Lymphs Abs 1.7 0.7 - 4.0 K/uL   Monocytes Relative 10 %   Monocytes Absolute 0.9 0.1 - 1.0 K/uL   Eosinophils Relative 2 %   Eosinophils Absolute 0.2 0.0 - 0.5 K/uL   Basophils Relative 1 %   Basophils Absolute 0.1 0.0 - 0.1 K/uL   Immature Granulocytes 1 %   Abs Immature Granulocytes 0.09 (H) 0.00 - 0.07 K/uL    Comment: Performed at [facility], [place] [address], [place], [place] [address]  Comprehensive metabolic panel     Status: Abnormal   Collection Time: 12/08/18  5:55 PM  Result Value Ref Range   Sodium 134 (L) 135 - 145 mmol/L   Potassium 6.2 (H) 3.5 - 5.1 mmol/L   Chloride 97 (L) 98 - 111 mmol/L   CO2 18 (L) 22 - 32 mmol/L   Glucose, Bld 179 (H) 70 - 99 mg/dL   BUN 61 (H) 8 - 23 mg/dL   Creatinine, Ser 3.57 (H) 0.44 - 1.00 mg/dL   Calcium 7.1 (L) 8.9 - 10.3 mg/dL   Total Protein 5.7 (L) 6.5 - 8.1 g/dL   Albumin 2.8 (L) 3.5 - 5.0 g/dL   AST 133 (H) 15 - 41 U/L   ALT 146 (H) 0 - 44 U/L   Alkaline Phosphatase 52 38 - 126 U/L   Total Bilirubin 0.5 0.3 - 1.2 mg/dL   GFR calc non Af Amer 10 (L) >60 mL/min   GFR calc Af Amer 12 (L) >60 mL/min   Anion gap 19 (H) 5 - 15    Comment: Performed at [facility], [place] [address] [place], [place] [address]  Lactic acid, plasma     Status: Abnormal   Collection  Time: 12/08/18  5:55 PM  Result Value Ref Range   Lactic Acid, Venous 7.1 (HH) 0.5 - 1.9 mmol/L    Comment: CRITICAL RESULT CALLED TO, READ BACK BY AND VERIFIED WITH: Unice Cobble [id number] 12/08/2018 WBOND Performed at [facility], [place] [address] [place], [place] [address]   Lipase, blood     Status: None   Collection Time: 12/08/18  5:55 PM  Result Value Ref Range   Lipase 20 11 - 51 U/L    Comment: Performed at [facility], [place] [address], [place], [place] [address]  Troponin I (High Sensitivity)     Status: Abnormal   Collection Time: 12/08/18  5:55 PM  Result Value Ref Range   Troponin I (High Sensitivity) 112 (HH) <18 ng/L    Comment: CRITICAL RESULT CALLED TO, READ BACK BY AND VERIFIED WITH: Unice Cobble [id number] 12/08/2018 WBOND (NOTE) Elevated high sensitivity troponin I (hsTnI) values and significant  changes across serial measurements may suggest ACS but many other  chronic and acute conditions are known to elevate hsTnI results.  Refer to the Links section for chest pain algorithms and additional  guidance. Performed at [facility], [place] [address], [place], [place] [address]   Protime-INR     Status: Abnormal   Collection Time: 12/08/18  5:55 PM  Result Value Ref Range   Prothrombin Time 19.2 (H) 11.4 - 15.2 seconds   INR 1.6 (H) 0.8 - 1.2    Comment: (NOTE) INR goal varies based on device and disease states. Performed at [facility], [place] [address], [place], [place] [address]   Type and screen [place]     Status: None   Collection Time: 12/08/18  5:55 PM  Result Value Ref Range   ABO/RH(D) O POS    Antibody Screen NEG    Sample Expiration      12/11/2018,2359 Performed at [facility], [place] [address] [place],  [address]   Urinalysis, Routine w reflex microscopic     Status: Abnormal   Collection Time: 12/08/18  6:00 PM  Result Value Ref Range   Color, Urine YELLOW YELLOW   APPearance  CLOUDY (A) CLEAR    Specific Gravity, Urine 1.015 1.005 - 1.030   pH 5.0 5.0 - 8.0   Glucose, UA 50 (A) NEGATIVE mg/dL   Hgb urine dipstick SMALL (A) NEGATIVE   Bilirubin Urine NEGATIVE NEGATIVE   Ketones, ur NEGATIVE NEGATIVE mg/dL   Protein, ur 100 (A) NEGATIVE mg/dL   Nitrite NEGATIVE NEGATIVE   Leukocytes,Ua NEGATIVE NEGATIVE   RBC / HPF 6-10 0 - 5 RBC/hpf   WBC, UA 0-5 0 - 5 WBC/hpf   Bacteria, UA RARE (A) NONE SEEN   Squamous Epithelial / LPF 0-5 0 - 5   Hyaline Casts, UA PRESENT     Comment: Performed at [place] [address] [place], [place] [address]  I-stat chem 8, ED (not at [facility] or [facility])     Status: Abnormal   Collection Time: 12/08/18  6:03 PM  Result Value Ref Range   Sodium 132 (L) 135 - 145 mmol/L   Potassium 6.0 (H) 3.5 - 5.1 mmol/L   Chloride 100 98 - 111 mmol/L   BUN 56 (H) 8 - 23 mg/dL   Creatinine, Ser 3.50 (H) 0.44 - 1.00 mg/dL   Glucose, Bld 168 (H) 70 - 99 mg/dL   Calcium, Ion 0.88 (LL) 1.15 - 1.40 mmol/L   TCO2 20 (L) 22 - 32 mmol/L   Hemoglobin 13.3 12.0 - 15.0 g/dL   HCT 39.0 36.0 - 46.0 %   Comment NOTIFIED PHYSICIAN   POCT I-Stat EG7     Status: Abnormal   Collection Time: 12/08/18  6:03 PM  Result Value Ref Range   pH, Ven 7.176 (LL) 7.250 - 7.430   pCO2, Ven 48.7 44.0 - 60.0 mmHg   pO2, Ven 54.0 (H) 32.0 - 45.0 mmHg   Bicarbonate 18.0 (L) 20.0 - 28.0 mmol/L   TCO2 19 (L) 22 - 32 mmol/L   O2 Saturation 78.0 %   Acid-base deficit 10.0 (H) 0.0 - 2.0 mmol/L   Sodium 131 (L) 135 - 145 mmol/L   Potassium 6.0 (H) 3.5 - 5.1 mmol/L   Calcium, Ion 0.88 (LL) 1.15 - 1.40 mmol/L   HCT 38.0 36.0 - 46.0 %   Hemoglobin 12.9 12.0 - 15.0 g/dL   Patient temperature HIDE    Sample type VENOUS    Comment NOTIFIED PHYSICIAN   POC SARS Coronavirus 2 Ag-ED -     Status: None   Collection Time: 12/08/18  6:11 PM  Result Value Ref Range   SARS Coronavirus 2 Ag NEGATIVE NEGATIVE    Comment: (NOTE) SARS-CoV-2 antigen NOT DETECTED.  Negative results are presumptive.   Negative results do not preclude SARS-CoV-2 infection and should not be used as the sole basis for treatment or other patient management decisions, including infection  control decisions, particularly in the presence of clinical signs and  symptoms consistent with COVID-19, or in those who have been in contact with the virus.  Negative results must be combined with clinical observations, patient history, and epidemiological information. The expected result is Negative. Fact Sheet for Patients: PodPark.tn Fact Sheet for Healthcare Providers: GiftContent.is This test is not yet approved or cleared by the [place] FDA and  has been authorized for detection and/or diagnosis of SARS-CoV-2 by FDA under an Emergency Use Authorization (EUA).  This EUA will remain in effect (meaning this test can be used) for the duration of  the COVID-19 de claration under Section 564(b)(1) of the Act, 21 U.S.C. section 360bbb-3(b)(1), unless the authorization is  terminated or revoked sooner.   POC occult blood, ED RN will collect     Status: Abnormal   Collection Time: 12/08/18  6:21 PM  Result Value Ref Range   Fecal Occult Bld POSITIVE (A) NEGATIVE  Lactic acid, plasma     Status: Abnormal   Collection Time: 12/08/18  8:20 PM  Result Value Ref Range   Lactic Acid, Venous 3.0 (HH) 0.5 - 1.9 mmol/L    Comment: CRITICAL VALUE NOTED.  VALUE IS CONSISTENT WITH PREVIOUSLY REPORTED AND CALLED VALUE. Performed at [facility], [place] [address] [place], [place] [address]   Troponin I (High Sensitivity)     Status: Abnormal   Collection Time: 12/08/18  8:20 PM  Result Value Ref Range   Troponin I (High Sensitivity) 158 (HH) <18 ng/L    Comment: CRITICAL VALUE NOTED.  VALUE IS CONSISTENT WITH PREVIOUSLY REPORTED AND CALLED VALUE. Performed at [facility], [place] [address], [place], [place] [address]   Brain natriuretic peptide     Status:  Abnormal   Collection Time: 12/08/18  8:44 PM  Result Value Ref Range   B Natriuretic Peptide 2,231.5 (H) 0.0 - 100.0 pg/mL    Comment: Performed at [facility] [address] [place], [place] [address]  I-STAT 7, (LYTES, BLD GAS, ICA, H+H)     Status: Abnormal   Collection Time: 12/08/18  8:51 PM  Result Value Ref Range   pH, Arterial 7.269 (L) 7.350 - 7.450   pCO2 arterial 58.7 (H) 32.0 - 48.0 mmHg   pO2, Arterial 268.0 (H) 83.0 - 108.0 mmHg   Bicarbonate 27.4 20.0 - 28.0 mmol/L   TCO2 29 22 - 32 mmol/L   O2 Saturation 100.0 %   Acid-base deficit 1.0 0.0 - 2.0 mmol/L   Sodium 133 (L) 135 - 145 mmol/L   Potassium 4.9 3.5 - 5.1 mmol/L   Calcium, Ion 1.02 (L) 1.15 - 1.40 mmol/L   HCT 37.0 36.0 - 46.0 %   Hemoglobin 12.6 12.0 - 15.0 g/dL   Patient temperature 96.2 F    Collection site RADIAL, ALLEN'S TEST ACCEPTABLE    Drawn by RT    Sample type ARTERIAL   CBG monitoring, ED     Status: None   Collection Time: 12/08/18  9:45 PM  Result Value Ref Range   Glucose-Capillary 93 70 - 99 mg/dL  CBG monitoring, ED     Status: Abnormal   Collection Time: 12/08/18 10:40 PM  Result Value Ref Range   Glucose-Capillary 130 (H) 70 - 99 mg/dL  I-STAT 7, (LYTES, BLD GAS, ICA, H+H)     Status: Abnormal   Collection Time: 12/08/18 10:45 PM  Result Value Ref Range   pH, Arterial 7.364 7.350 - 7.450   pCO2 arterial 47.0 32.0 - 48.0 mmHg   pO2, Arterial 105.0 83.0 - 108.0 mmHg   Bicarbonate 27.0 20.0 - 28.0 mmol/L   TCO2 28 22 - 32 mmol/L   O2 Saturation 98.0 %   Acid-Base Excess 1.0 0.0 - 2.0 mmol/L   Sodium 133 (L) 135 - 145 mmol/L   Potassium 4.6 3.5 - 5.1 mmol/L   Calcium, Ion 1.04 (L) 1.15 - 1.40 mmol/L   HCT 36.0 36.0 - 46.0 %   Hemoglobin 12.2 12.0 - 15.0 g/dL   Patient temperature 97.7 F    Collection site RADIAL, ALLEN'S TEST ACCEPTABLE    Drawn by RT    Sample type ARTERIAL   Glucose, capillary  Status: Abnormal   Collection Time: 12/08/18 11:27 PM  Result Value  Ref Range   Glucose-Capillary 108 (H) 70 - 99 mg/dL    Ct Abdomen Pelvis Wo Contrast  Result Date: 12/08/2018 CLINICAL DATA:  85 year old female with abdominal pain. Concern for gastroenteritis versus colitis. EXAM: CT ABDOMEN AND PELVIS WITHOUT CONTRAST TECHNIQUE: Multidetector CT imaging of the abdomen and pelvis was performed following the standard protocol without IV contrast. COMPARISON:  CT of the abdomen pelvis dated 03/05/2018. FINDINGS: Evaluation of this exam is limited due to severe respiratory motion artifact as well as in the absence of intravenous contrast. Evaluation is also limited due to streak artifact caused by patient's arms. Lower chest: Partially visualized small right and trace left pleural effusions. This is new or increased since the prior CT. Bibasilar airspace densities, right greater left concerning for developing infiltrate. Clinical correlation is recommended. There is cardiomegaly. Coronary vascular calcifications noted. No intra-abdominal free air. Small ascites. Hepatobiliary: The liver is grossly unremarkable. High attenuating content within the gallbladder represents noncalcified stone or sludge. Ultrasound may provide better evaluation of the gallbladder. Pancreas: The pancreas is grossly unremarkable as visualized. Spleen: Normal in size without focal abnormality. Adrenals/Urinary Tract: The adrenal glands are unremarkable. There is no hydronephrosis or nephrolithiasis on either side. The urinary bladder is decompressed around a Foley catheter. Stomach/Bowel: There is sigmoid diverticulosis without active inflammatory changes. The stomach is distended with air and ingested content. The cecum appears to be located in the right upper abdomen. Linear high attenuation in the region of the cecum likely surgical suture. Small clusters of air superior to the gastric antrum (coronal series 6 images 32-44 and axial series 3 images 39-42) appear to be within a tubular structure which  measures approximately 11 mm in diameter. This may represent an abnormal appendix. However, I am not able to identified and appendix on the prior CT and therefore findings may represent extraluminal air related to perforation of peptic ulcer. Evaluation however is very limited due to factors above. Clinical correlation is recommended. Repeat CT with oral contrast may provide better evaluation. There is no bowel obstruction. Vascular/Lymphatic: Advanced aortoiliac atherosclerotic disease. The aorta is tortuous. Reproductive: Hysterectomy. A pessary is noted within pelvis. Other: Diffuse subcutaneous edema. Musculoskeletal: Degenerative changes of the spine. Old healed right pubic bone fractures. No acute osseous pathology. IMPRESSION: 1. Small clusters of air superior to the gastric antrum. Findings may represent an abnormal appendix or extraluminal air related to perforated peptic ulcer. Evaluation is very limited due to respiratory motion artifact. Correlation with clinical exam and history of prior appendectomy recommended. Repeat CT with oral contrast may provide better evaluation. 2. Probable gallbladder sludge or stone. 3. Sigmoid diverticulosis. No bowel obstruction. 4. Small right and trace left pleural effusions with bibasilar airspace densities concerning for developing infiltrate. Clinical correlation is recommended. 5. Aortic Atherosclerosis (ICD10-I70.0). These results were called by telephone at the time of interpretation on 12/08/2018 at 7:29 pm to provider RACHEL LITTLE , who verbally acknowledged these results. Electronically Signed   By: Anner Crete M.D.   On: 12/08/2018 19:38   Ct Head Wo Contrast  Result Date: 12/08/2018 CLINICAL DATA:  Altered level of consciousness EXAM: CT HEAD WITHOUT CONTRAST TECHNIQUE: Contiguous axial images were obtained from the base of the skull through the vertex without intravenous contrast. COMPARISON:  None. FINDINGS: Brain: There is atrophy and chronic small  vessel disease changes. No acute intracranial abnormality. Specifically, no hemorrhage, hydrocephalus, mass lesion, acute infarction, or significant intracranial  injury. Vascular: No hyperdense vessel or unexpected calcification. Skull: No acute calvarial abnormality. Sinuses/Orbits: Visualized paranasal sinuses and mastoids clear. Orbital soft tissues unremarkable. Other: None IMPRESSION: Atrophy, chronic microvascular disease. No acute intracranial abnormality. Electronically Signed   By: Rolm Baptise M.D.   On: 12/08/2018 19:17   Dg Chest Portable 1 View  Result Date: 12/08/2018 CLINICAL DATA:  Bradycardia. Respiratory distress. Recent hospitalization. EXAM: PORTABLE CHEST 1 VIEW COMPARISON:  Radiograph 11/24/2018. FINDINGS: Chronic cardiomegaly that is unchanged. Aortic tortuosity and atherosclerosis, also unchanged. Central pulmonary edema slightly worsened from prior exam. There are bilateral pleural effusions and bibasilar opacities. No pneumothorax. No acute osseous abnormalities are seen. IMPRESSION: Slight worsening of pulmonary edema. Cardiomegaly and bilateral pleural effusions are similar to recent exam. Bibasilar opacity, favoring compressive atelectasis. Electronically Signed   By: Keith Rake M.D.   On: 12/08/2018 18:13    Review of Systems  Unable to perform ROS: Medical condition   Blood pressure 111/71, pulse 69, temperature 98.8 F (37.1 C), temperature source Bladder, resp. rate 16, weight 60.5 kg, SpO2 94 %. Physical Exam  GI: Soft. Bowel sounds are normal. She exhibits no distension. There is no abdominal tenderness.    Assessment/Plan: Abdominal pain -unsure of what the air is on ct scan, she has no tenderness on exam -ccm note and family both say dont want surgery or not a surgical candidate -if desire is to evaluate this further then could repeat ct scan with oral contrast the scan that is there currently is not high quality   Rolm Bookbinder 12/08/2018, 11:50  PM

## 2018-12-08 NOTE — Progress Notes (Signed)
Pharmacy Antibiotic Note  Mariah Aguilar is a 85 y.o. female admitted on 12/08/2018 bradycardia, found to have probable GI bleed. Starting empiric abx, unclear source, pt also has AKI, SCr 0.8 > 3.5.   Plan: -Cefepime 1 g IV q24h -Vancomycin 1 g IV x1 -Monitor renal fx, will have to dose vancomycin based on levels     Temp (24hrs), Avg:95.3 F (35.2 C), Min:93.5 F (34.2 C), Max:96.2 F (35.7 C)  Recent Labs  Lab 12/08/18 1755 12/08/18 1803  WBC 9.0  --   CREATININE 3.57* 3.50*  LATICACIDVEN 7.1*  --     Estimated Creatinine Clearance: 7.4 mL/min (A) (by C-G formula based on SCr of 3.5 mg/dL (H)).    Allergies  Allergen Reactions  . Shrimp [Shellfish Allergy] Itching and Swelling  . Penicillins Hives    Did it involve swelling of the face/tongue/throat, SOB, or low BP? Unknown Did it involve sudden or severe rash/hives, skin peeling, or any reaction on the inside of your mouth or nose? Unknown Did you need to seek medical attention at a hospital or doctor's office? Unknown When did it last happen? If all above answers are "NO", may proceed with cephalosporin use.    Antimicrobials this admission: 12/1 cefepime > 12/1 vancomycin >  Dose adjustments this admission: N/A  Microbiology results: 12/1 blood cx: 12/1 urine cx:  Harvel Quale 12/08/2018 8:33 PM

## 2018-12-08 NOTE — ED Notes (Signed)
Pt continues to be paced pt is alert and oriented to person and place. Pt stated pacing was painful so was given pain medication. Family is here MD aware they are in consult room.

## 2018-12-08 NOTE — ED Notes (Signed)
Pt back from ct scan daughter at bedside.

## 2018-12-08 NOTE — Consult Note (Signed)
Reason for Consult: Congestive heart failure/marked bradycardia Referring Physician: [facility] hospitalist  Mariah Aguilar is an 85 y.o. female.  HPI: Patient is 85 year old female with past medical history significant for hypertension, diabetes mellitus, history of dilated cardiomyopathy and recurrent congestive heart failure secondary to diastolic/systolic dysfunction, severe mitral regurgitation, hyperlipidemia, osteoarthritis, recently discharged from the hospital came to ER by EMS complaining of vague abdominal pain for last 3 days associated with a black tarry stool.  As per granddaughter patient took 600 mg of ibuprofen on Saturday because of abdominal pain and since then has initially diarrhea with black tarry stool and today this afternoon became suddenly unresponsive called EMS noted to be bradycardic heart rate in 20s was placed on cutaneous pacemaker with improvement in her mentation.  Patient denies any chest pain or shortness of breath as per granddaughter prior to this.  Patient was also noted to be hyperkalemic with acute renal failure and acidotic.  Patient presently is awake baseline rhythm is marked sinus bradycardia with heart rate in 30s.  Past Medical History:  Diagnosis Date  . Arthritis   . Cardiomyopathy   . Cecal neoplasm   . CHF (congestive heart failure) ([place])   . Colon polyps   . Diverticulosis   . Hyperlipidemia   . Hypertension   . Insomnia   . Internal hemorrhoids   . Left bundle branch block (LBBB) on electrocardiogram    EKG of 5'12 shows 1st degree AV block/LBBB -Epic.    Past Surgical History:  Procedure Laterality Date  . ABDOMINAL HYSTERECTOMY    . COLON SURGERY    . COLONOSCOPY  05/27/2011   Procedure: COLONOSCOPY;  Surgeon: Juanita Craver, MD;  Location: [facility];  Service: Endoscopy;  Laterality: N/A;  . COLONOSCOPY WITH PROPOFOL N/A 06/30/2014   Procedure: COLONOSCOPY WITH PROPOFOL;  Surgeon: Juanita Craver, MD;  Location: [facility];  Service:  Endoscopy;  Laterality: N/A;    Family History  Problem Relation Age of Onset  . Cancer Mother        COLON  . Stroke Father   . Diabetes Sister     Social History:  reports that she has never smoked. She has never used smokeless tobacco. She reports that she does not drink alcohol or use drugs.  Allergies:  Allergies  Allergen Reactions  . Shrimp [Shellfish Allergy] Itching and Swelling  . Penicillins Hives    Did it involve swelling of the face/tongue/throat, SOB, or low BP? Unknown Did it involve sudden or severe rash/hives, skin peeling, or any reaction on the inside of your mouth or nose? Unknown Did you need to seek medical attention at a hospital or doctor's office? Unknown When did it last happen? If all above answers are "NO", may proceed with cephalosporin use.    Medications: I have reviewed the patient's current medications.  Results for orders placed or performed during the hospital encounter of 12/08/18 (from the past 48 hour(s))  CBC with Differential     Status: Abnormal   Collection Time: 12/08/18  5:55 PM  Result Value Ref Range   WBC 9.0 4.0 - 10.5 K/uL   RBC 4.37 3.87 - 5.11 MIL/uL   Hemoglobin 12.1 12.0 - 15.0 g/dL   HCT 38.3 36.0 - 46.0 %   MCV 87.6 80.0 - 100.0 fL   MCH 27.7 26.0 - 34.0 pg   MCHC 31.6 30.0 - 36.0 g/dL   RDW 17.5 (H) 11.5 - 15.5 %   Platelets 729 (H) 150 - 400 K/uL  nRBC 0.0 0.0 - 0.2 %   Neutrophils Relative % 67 %   Neutro Abs 6.1 1.7 - 7.7 K/uL   Lymphocytes Relative 19 %   Lymphs Abs 1.7 0.7 - 4.0 K/uL   Monocytes Relative 10 %   Monocytes Absolute 0.9 0.1 - 1.0 K/uL   Eosinophils Relative 2 %   Eosinophils Absolute 0.2 0.0 - 0.5 K/uL   Basophils Relative 1 %   Basophils Absolute 0.1 0.0 - 0.1 K/uL   Immature Granulocytes 1 %   Abs Immature Granulocytes 0.09 (H) 0.00 - 0.07 K/uL    Comment: Performed at [facility] [address] [place], [place] [address]  Comprehensive metabolic panel     Status:  Abnormal   Collection Time: 12/08/18  5:55 PM  Result Value Ref Range   Sodium 134 (L) 135 - 145 mmol/L   Potassium 6.2 (H) 3.5 - 5.1 mmol/L   Chloride 97 (L) 98 - 111 mmol/L   CO2 18 (L) 22 - 32 mmol/L   Glucose, Bld 179 (H) 70 - 99 mg/dL   BUN 61 (H) 8 - 23 mg/dL   Creatinine, Ser 3.57 (H) 0.44 - 1.00 mg/dL   Calcium 7.1 (L) 8.9 - 10.3 mg/dL   Total Protein 5.7 (L) 6.5 - 8.1 g/dL   Albumin 2.8 (L) 3.5 - 5.0 g/dL   AST 133 (H) 15 - 41 U/L   ALT 146 (H) 0 - 44 U/L   Alkaline Phosphatase 52 38 - 126 U/L   Total Bilirubin 0.5 0.3 - 1.2 mg/dL   GFR calc non Af Amer 10 (L) >60 mL/min   GFR calc Af Amer 12 (L) >60 mL/min   Anion gap 19 (H) 5 - 15    Comment: Performed at [facility], [place] [address], [place], [place] [address]  Lactic acid, plasma     Status: Abnormal   Collection Time: 12/08/18  5:55 PM  Result Value Ref Range   Lactic Acid, Venous 7.1 (HH) 0.5 - 1.9 mmol/L    Comment: CRITICAL RESULT CALLED TO, READ BACK BY AND VERIFIED WITH: Unice Cobble [id number] 12/08/2018 WBOND Performed at [facility], [place] [address] [place], [place] [address]   Lipase, blood     Status: None   Collection Time: 12/08/18  5:55 PM  Result Value Ref Range   Lipase 20 11 - 51 U/L    Comment: Performed at [facility], [place] [address], [place], [place] [address]  Troponin I (High Sensitivity)     Status: Abnormal   Collection Time: 12/08/18  5:55 PM  Result Value Ref Range   Troponin I (High Sensitivity) 112 (HH) <18 ng/L    Comment: CRITICAL RESULT CALLED TO, READ BACK BY AND VERIFIED WITH: Unice Cobble [id number] 12/08/2018 WBOND (NOTE) Elevated high sensitivity troponin I (hsTnI) values and significant  changes across serial measurements may suggest ACS but many other  chronic and acute conditions are known to elevate hsTnI results.  Refer to the Links section for chest pain algorithms and additional  guidance. Performed at [facility], [place] [address] [place],  [place] [address]   Protime-INR     Status: Abnormal   Collection Time: 12/08/18  5:55 PM  Result Value Ref Range   Prothrombin Time 19.2 (H) 11.4 - 15.2 seconds   INR 1.6 (H) 0.8 - 1.2    Comment: (NOTE) INR goal varies based on device and disease states. Performed at [facility], [place] [address] [place], [place] [address]   Type and screen [place]     Status: None   Collection Time: 12/08/18  5:55 PM  Result Value Ref Range   ABO/RH(D) O POS    Antibody Screen NEG    Sample Expiration      12/11/2018,2359 Performed at [facility], [place] [address] [place], [place] [address]   Urinalysis, Routine w reflex microscopic     Status: Abnormal   Collection Time: 12/08/18  6:00 PM  Result Value Ref Range   Color, Urine YELLOW YELLOW   APPearance CLOUDY (A) CLEAR   Specific Gravity, Urine 1.015 1.005 - 1.030   pH 5.0 5.0 - 8.0   Glucose, UA 50 (A) NEGATIVE mg/dL   Hgb urine dipstick SMALL (A) NEGATIVE   Bilirubin Urine NEGATIVE NEGATIVE   Ketones, ur NEGATIVE NEGATIVE mg/dL   Protein, ur 100 (A) NEGATIVE mg/dL   Nitrite NEGATIVE NEGATIVE   Leukocytes,Ua NEGATIVE NEGATIVE   RBC / HPF 6-10 0 - 5 RBC/hpf   WBC, UA 0-5 0 - 5 WBC/hpf   Bacteria, UA RARE (A) NONE SEEN   Squamous Epithelial / LPF 0-5 0 - 5   Hyaline Casts, UA PRESENT     Comment: Performed at [facility], [place] [address] [place], [place] [address]  I-stat chem 8, ED (not at [facility] or [facility])     Status: Abnormal   Collection Time: 12/08/18  6:03 PM  Result Value Ref Range   Sodium 132 (L) 135 - 145 mmol/L   Potassium 6.0 (H) 3.5 - 5.1 mmol/L   Chloride 100 98 - 111 mmol/L   BUN 56 (H) 8 - 23 mg/dL   Creatinine, Ser 3.50 (H) 0.44 - 1.00 mg/dL   Glucose, Bld 168 (H) 70 - 99 mg/dL   Calcium, Ion 0.88 (LL) 1.15 - 1.40 mmol/L   TCO2 20 (L) 22 - 32 mmol/L   Hemoglobin 13.3 12.0 - 15.0 g/dL   HCT 39.0 36.0 - 46.0 %   Comment NOTIFIED PHYSICIAN   POCT I-Stat EG7     Status: Abnormal    Collection Time: 12/08/18  6:03 PM  Result Value Ref Range   pH, Ven 7.176 (LL) 7.250 - 7.430   pCO2, Ven 48.7 44.0 - 60.0 mmHg   pO2, Ven 54.0 (H) 32.0 - 45.0 mmHg   Bicarbonate 18.0 (L) 20.0 - 28.0 mmol/L   TCO2 19 (L) 22 - 32 mmol/L   O2 Saturation 78.0 %   Acid-base deficit 10.0 (H) 0.0 - 2.0 mmol/L   Sodium 131 (L) 135 - 145 mmol/L   Potassium 6.0 (H) 3.5 - 5.1 mmol/L   Calcium, Ion 0.88 (LL) 1.15 - 1.40 mmol/L   HCT 38.0 36.0 - 46.0 %   Hemoglobin 12.9 12.0 - 15.0 g/dL   Patient temperature HIDE    Sample type VENOUS    Comment NOTIFIED PHYSICIAN   POC SARS Coronavirus 2 Ag-ED -     Status: None   Collection Time: 12/08/18  6:11 PM  Result Value Ref Range   SARS Coronavirus 2 Ag NEGATIVE NEGATIVE    Comment: (NOTE) SARS-CoV-2 antigen NOT DETECTED.  Negative results are presumptive.  Negative results do not preclude SARS-CoV-2 infection and should not be used as the sole basis for treatment or other patient management decisions, including infection  control decisions, particularly in the presence of clinical signs and  symptoms consistent with COVID-19, or in  those who have been in contact with the virus.  Negative results must be combined with clinical observations, patient history, and epidemiological information. The expected result is Negative. Fact Sheet for Patients: PodPark.tn Fact Sheet for Healthcare Providers: GiftContent.is This test is not yet approved or cleared by the [place] FDA and  has been authorized for detection and/or diagnosis of SARS-CoV-2 by FDA under an Emergency Use Authorization (EUA).  This EUA will remain in effect (meaning this test can be used) for the duration of  the COVID-19 de claration under Section 564(b)(1) of the Act, 21 U.S.C. section 360bbb-3(b)(1), unless the authorization is terminated or revoked sooner.   POC occult blood, ED RN will collect     Status: Abnormal    Collection Time: 12/08/18  6:21 PM  Result Value Ref Range   Fecal Occult Bld POSITIVE (A) NEGATIVE    Ct Head Wo Contrast  Result Date: 12/08/2018 CLINICAL DATA:  Altered level of consciousness EXAM: CT HEAD WITHOUT CONTRAST TECHNIQUE: Contiguous axial images were obtained from the base of the skull through the vertex without intravenous contrast. COMPARISON:  None. FINDINGS: Brain: There is atrophy and chronic small vessel disease changes. No acute intracranial abnormality. Specifically, no hemorrhage, hydrocephalus, mass lesion, acute infarction, or significant intracranial injury. Vascular: No hyperdense vessel or unexpected calcification. Skull: No acute calvarial abnormality. Sinuses/Orbits: Visualized paranasal sinuses and mastoids clear. Orbital soft tissues unremarkable. Other: None IMPRESSION: Atrophy, chronic microvascular disease. No acute intracranial abnormality. Electronically Signed   By: Rolm Baptise M.D.   On: 12/08/2018 19:17   Dg Chest Portable 1 View  Result Date: 12/08/2018 CLINICAL DATA:  Bradycardia. Respiratory distress. Recent hospitalization. EXAM: PORTABLE CHEST 1 VIEW COMPARISON:  Radiograph 11/24/2018. FINDINGS: Chronic cardiomegaly that is unchanged. Aortic tortuosity and atherosclerosis, also unchanged. Central pulmonary edema slightly worsened from prior exam. There are bilateral pleural effusions and bibasilar opacities. No pneumothorax. No acute osseous abnormalities are seen. IMPRESSION: Slight worsening of pulmonary edema. Cardiomegaly and bilateral pleural effusions are similar to recent exam. Bibasilar opacity, favoring compressive atelectasis. Electronically Signed   By: Keith Rake M.D.   On: 12/08/2018 18:13    Review of Systems  Constitutional: Negative for chills and fever.  Cardiovascular: Negative for chest pain and leg swelling.  Gastrointestinal: Positive for abdominal pain, diarrhea and melena.  Genitourinary: Negative for dysuria.   Neurological: Positive for dizziness.   Blood pressure (!) 96/58, pulse 98, temperature (!) 93.5 F (34.2 C), resp. rate (!) 26, SpO2 100 %. Physical Exam  Eyes: Conjunctivae are normal. Left eye exhibits no discharge. No scleral icterus.  Neck: Normal range of motion. Neck supple. JVD present. No tracheal deviation present. Thyromegaly present.  Cardiovascular:  Paced rhythm on the monitor S1-S2 soft  Respiratory:  Decreased breath sound at bases with faint rales.  GI: Soft. Bowel sounds are normal. She exhibits distension. There is abdominal tenderness. There is no rebound.  Musculoskeletal:        General: Edema (Trace edema noted) present. No tenderness or deformity.  Neurological:  Awake responds to verbal commands appropriately    Assessment/Plan: Symptomatic bradycardia rule out coronary insufficiency Decompensated systolic congestive heart failure  Minimally elevated troponin secondary to above rule out MI Acute upper GI bleed precipitated by NSAID use Valvular heart disease with severe mitral regurgitation Acute renal failure Metabolic acidosis Hypertension Diabetes mellitus Hyperlipidemia Degenerative joint disease Hyperkalemia Plan Continue transcutaneous pacer at this point Check serial enzymes and EKG Hold beta-blockers and Entresto Discussed with patient's granddaughter  regarding CODE STATUS she will discuss with family members but in the past patient has voiced against any aggressive measures. Patient is not a candidate for pacemaker at this point discussed with granddaughter and agrees. Rx for hyperkalemia has been given by EDP. Monitor serial H&H and renal function    Charolette Forward 12/08/2018, 7:25 PM

## 2018-12-08 NOTE — ED Triage Notes (Signed)
Pt BIB [facility] from home, family reports pt had been complaining of abdominal pain x 3 days. Today family noted pt becoming less responsive than normal. On EMS arrival, pt unresponsive with a HR of 22, pulse present. EMS began pacing pt at a rate of 60. On arrival to ED, pt responsive to verbal.

## 2018-12-08 NOTE — ED Notes (Signed)
0.5mg  IV atropine given by Dr. Rex Kras.

## 2018-12-08 NOTE — Consult Note (Signed)
NAME:  Mariah Aguilar, MRN:  [id number], DOB:  12-28-1922, LOS: 0 ADMISSION DATE:  12/08/2018, CONSULTATION DATE:  12/08/18  REFERRING MD:  Sharlett Iles, MD , CHIEF COMPLAINT:  Abdominal pain   Brief History   Mariah Aguilar is a 85 y.o. female with HFrEF (EF 20%), DM2, known diverticular disease, GERD, HLD and HLD presenting with bradycardia (probable complete heart block although no EKG obtained due to presence of transcutaneous pacing on arrival) and abdominal pain with likely perforated duodenal ulcer leading with lactic acidosis, AKI and hyperkalemia.  History of present illness   Mariah Aguilar is a 85 y.o. female with h/o HFrEF (EF 20%), DM2, known diverticular disease, GERD, HTN and HLD who presenting with cramping abdominal pain for 3 days with dark stool. Her family found her less responsive than usual called EMS. On EMS arrival, she was found to be profoundly bradycardic with heart rate in the 20s and transcutaneous pacing was initiated prior to ED arrival. Her granddaughter, [name], relays the history. She states that her grandmother has been using NSAIDs for her abdominal pain although the exact amount is unclear. She is unaware of any report of frank blood in the stool or melena. She had not been complaining of chest pain or worsening shortness of breath recently. No recent fevers or infectious symptoms. No report of inappropriate medication use (patient on beta blocker). [name] reports that her grandmother had good quality of life and good functional status until her diagnosis with heart failure. She was hospitalized for volume overload from 11/17 - 11/20 recently as well. Upon discharge from that admission, she was started on Entresto.  In the ED, she was given reversal agents for hyperkalemia and transcutaneous pacing was continued. Cardiology was consulted and felt that she was not a candidate for temporary pacemaker placement. General Surgery was called and it was  felt that she was not a surgical candidate. Labs notable for platelets 729, initial potassium 6.2, CO2 18, BUN 61, creatinine 3.57 (baseline 0.8 to 0.9), AST 133, ALT 146. VBG showed pH 7.18, pCO2 54. FOBT positive. CT a/p showed small clusters of air superior to the gastric antrum concerning for extraluminal air related to perforated peptic ulcer. She was placed on dobutamine drip. Patient's responsiveness improved after 0.4 mg Narcan after ABG showed pCO2 of 59, up from 49 on a VBG 2 hours prior.  Critical Care called for admission to ICU.  While in the ED, long conversation took place with the patient's family. At this time, they would like her to remain Full Code, but they are quite conflicted on whether she would want intubation and resuscitation. She would not want dialysis. They are okay with central line placement.  Past Medical History  HFrEF (EF 20%) DM2 Diverticular disease GERD HTN HLD  Consults:  Cardiology (Dr. Terrence Dupont)  Procedures:  None  Significant Diagnostic Tests:  As noted above in HPI.  Micro Data:  Blood cultures (12/1) pending POC SARS-CoV 2 negative  Antimicrobials:  Vanc, cefepime, Flagyl (12/1-)  Objective   Blood pressure 95/65, pulse (!) 108, temperature (!) 96.2 F (35.7 C), resp. rate 15, SpO2 92 %.       No intake or output data in the 24 hours ending 12/08/18 2013 There were no vitals filed for this visit.  Examination: General: ill-appearing elderly woman HENT: pupils 63mm, reactive, face mask in place Lungs: scattered rhonchi in upper lung fields, crackles at right base Cardiovascular: paced rhythm, no m/r/g Abdomen: distended, no  bowel sounds auscultated Extremities: 1+ pedal pulses, Bair hugger on Neuro: localizes to pain, no verbal response or eye opening GU: deferred  Assessment & Plan:  Mariah Aguilar is a 85 y.o. female with above medical history admitted with symptomatic bradycardia requiring transcutaneous pacing, likely  perforated duodenal ulcer with upper GI bleed, AKI, lactic acidosis and hyperkalemia.  Probable perforated duodenal ulcer with upper GI bleed Lactic acidosis - Start IV PPI BID - Continue vanc, cefepime, metronidazole given perforated ulcer and shock - Surgery consulted; not surgical candidate - Trend lactic acid - Trend H&H; if further melena or worsening anemia, will consult GI although interventional options are likely limited - Continue goals of care discussions with family  Cardiogenic shock 2/2 bradycardia, likely complete heart block Inciting event likely related to acute illness. No clear history of beta blocker overdose although certainly possible. Hyperkalemia likely impacting bradycardia, however, it is out of proportion to what I would expect with a potassium of 6.2 in a patient with previously normal renal function. Cardiology has deemed her not a candidate for pacemaker. - Continue transcutaneous pacing - Dobutamine for now; will transition to epinephrine if patient's family is agreeable to central line placement - Will consider IV insulin, glucose infusion, calcium boluses and IV glucagon pending family's wishes for aggressive care - Correct hyperkalemia - Trend troponins  Hypercarbic respiratory failure - Likely opiate-induced - Pt poor candidate for BiPAP given AMS - Will repeat VBG to assess trend - Ongoing discussions with family regarding code status  AKI - Start bicarb gtt, with goal CO2 >22 - Avoid nephrotoxins - Strict I/O - Trend renal function, electrolytes  Toxic-metabolic encephalopathy - Multifactorial in the setting of acute illness and medications - Hold opiates as able; would be cautious with dosing if necessary - Narcan PRN for reversal  Best practice:  Diet: NPO Pain/Anxiety/Delirium protocol (if indicated): N/A VAP protocol (if indicated): N/A DVT prophylaxis: SCDs GI prophylaxis: PPI Glucose control: IV insulin Mobility: Bed rest Code  Status: Full Code Family Communication: Granddaughter, [name] Disposition: ICU  Labs   CBC: Recent Labs  Lab 12/08/18 1755 12/08/18 1803  WBC 9.0  --   NEUTROABS 6.1  --   HGB 12.1 12.9   13.3  HCT 38.3 38.0   39.0  MCV 87.6  --   PLT 729*  --     Basic Metabolic Panel: Recent Labs  Lab 12/08/18 1755 12/08/18 1803  NA 134* 131*   132*  K 6.2* 6.0*   6.0*  CL 97* 100  CO2 18*  --   GLUCOSE 179* 168*  BUN 61* 56*  CREATININE 3.57* 3.50*  CALCIUM 7.1*  --    GFR: Estimated Creatinine Clearance: 7.4 mL/min (A) (by C-G formula based on SCr of 3.5 mg/dL (H)). Recent Labs  Lab 12/08/18 1755  WBC 9.0  LATICACIDVEN 7.1*    Liver Function Tests: Recent Labs  Lab 12/08/18 1755  AST 133*  ALT 146*  ALKPHOS 52  BILITOT 0.5  PROT 5.7*  ALBUMIN 2.8*   Recent Labs  Lab 12/08/18 1755  LIPASE 20   No results for input(s): AMMONIA in the last 168 hours.  ABG    Component Value Date/Time   HCO3 18.0 (L) 12/08/2018 1803   TCO2 20 (L) 12/08/2018 1803   TCO2 19 (L) 12/08/2018 1803   ACIDBASEDEF 10.0 (H) 12/08/2018 1803   O2SAT 78.0 12/08/2018 1803     Coagulation Profile: Recent Labs  Lab 12/08/18 1755  INR 1.6*  Cardiac Enzymes: No results for input(s): CKTOTAL, CKMB, CKMBINDEX, TROPONINI in the last 168 hours.  HbA1C: Hgb A1c MFr Bld  Date/Time Value Ref Range Status  10/08/2018 03:26 PM 6.1 (H) 4.8 - 5.6 % Final    Comment:             Prediabetes: 5.7 - 6.4          Diabetes: >6.4          Glycemic control for adults with diabetes: <7.0   05/19/2018 12:45 PM 6.5 (H) 4.8 - 5.6 % Final    Comment:             Prediabetes: 5.7 - 6.4          Diabetes: >6.4          Glycemic control for adults with diabetes: <7.0     CBG: No results for input(s): GLUCAP in the last 168 hours.  Review of Systems:   Pertinent findings noted in HPI.  Past Medical History  She,  has a past medical history of Arthritis, Cardiomyopathy, Cecal neoplasm, CHF  (congestive heart failure) ([place]), Colon polyps, Diverticulosis, Hyperlipidemia, Hypertension, Insomnia, Internal hemorrhoids, and Left bundle branch block (LBBB) on electrocardiogram.   Surgical History    Past Surgical History:  Procedure Laterality Date   ABDOMINAL HYSTERECTOMY     COLON SURGERY     COLONOSCOPY  05/27/2011   Procedure: COLONOSCOPY;  Surgeon: Juanita Craver, MD;  Location: [facility];  Service: Endoscopy;  Laterality: N/A;   COLONOSCOPY WITH PROPOFOL N/A 06/30/2014   Procedure: COLONOSCOPY WITH PROPOFOL;  Surgeon: Juanita Craver, MD;  Location: [facility];  Service: Endoscopy;  Laterality: N/A;     Social History   reports that she has never smoked. She has never used smokeless tobacco. She reports that she does not drink alcohol or use drugs.   Family History   Her family history includes Cancer in her mother; Diabetes in her sister; Stroke in her father.   Allergies Allergies  Allergen Reactions   Shrimp [Shellfish Allergy] Itching and Swelling   Penicillins Hives    Did it involve swelling of the face/tongue/throat, SOB, or low BP? Unknown Did it involve sudden or severe rash/hives, skin peeling, or any reaction on the inside of your mouth or nose? Unknown Did you need to seek medical attention at a hospital or doctor's office? Unknown When did it last happen? If all above answers are NO, may proceed with cephalosporin use.     Home Medications  Prior to Admission medications   Medication Sig Start Date End Date Taking? Authorizing Provider  amLODipine-valsartan (EXFORGE) 10-320 MG tablet Take 1 tablet by mouth daily.   Yes [provider]  carvedilol (COREG) 12.5 MG tablet Take 12.5 mg by mouth 2 (two) times daily with a meal.  11/10/13  Yes [provider]  DEXILANT 60 MG capsule TAKE 1 CAPSULE BY MOUTH EVERY DAY Patient taking differently: Take 60 mg by mouth daily.  11/30/18  Yes Glendale Chard, MD  diazepam (VALIUM) 5 MG  tablet Take 2.5 mg by mouth at bedtime as needed for sedation.  12/11/13  Yes [provider]  furosemide (LASIX) 40 MG tablet Take 0.5-1 tablets (20-40 mg total) by mouth 2 (two) times daily. Take 40mg  in the morning and 20mg  at lunch. Patient taking differently: Take 40 mg by mouth 2 (two) times daily.  11/27/18  Yes Black, Lezlie Octave, NP  guaiFENesin (MUCINEX) 600 MG 12 hr tablet Take 1  tablet (600 mg total) by mouth 2 (two) times daily. Patient taking differently: Take 600 mg by mouth 2 (two) times daily as needed for cough or to loosen phlegm.  07/31/18  Yes Swayze, Ava, DO  ibuprofen (ADVIL) 600 MG tablet Take 600 mg by mouth daily as needed for moderate pain.  10/10/18  Yes [provider]  Iron Polysacch Cmplx-B12-FA (NIFEREX-150 FORTE PO) Take 150 mg by mouth daily.    Yes [provider]  potassium chloride (KLOR-CON) 8 MEQ CR tablet Take 8 mEq by mouth daily.     Yes [provider]  rosuvastatin (CRESTOR) 10 MG tablet Take 10 mg by mouth daily.     Yes [provider]  sacubitril-valsartan (ENTRESTO) 97-103 MG Take 1 tablet by mouth 2 (two) times daily. 11/27/18  Yes Black, Lezlie Octave, NP  Spacer/Aero-Holding Chambers (AEROCHAMBER PLUS) inhaler Use as instructed 10/08/18  Yes Glendale Chard, MD  traMADol (ULTRAM) 50 MG tablet Take 50 mg by mouth as needed for moderate pain.  10/13/17  Yes [provider]  VENTOLIN HFA 108 (90 Base) MCG/ACT inhaler TAKE 2 PUFFS BY MOUTH EVERY 6 HOURS AS NEEDED FOR WHEEZE OR SHORTNESS OF BREATH Patient taking differently: Inhale 2 puffs into the lungs every 6 (six) hours as needed for wheezing or shortness of breath.  11/10/18  Yes Glendale Chard, MD     Critical care time: 80 minutes

## 2018-12-08 NOTE — ED Provider Notes (Signed)
[facility] EMERGENCY DEPARTMENT Provider Note   CSN: [id number] Arrival date & time: 12/08/18  1731     History   Chief Complaint Chief Complaint  Patient presents with   Bradycardia    HPI Mariah Aguilar is a 85 y.o. female.     85yo F w/ PMH below including CHF, HTN, T2DM who p/w AMS.  Family reported to EMS that the patient has been complaining of abdominal pain for the past 3 days.  This afternoon, she suddenly became altered and less responsive so they called EMS.  On EMS arrival, the patient was unresponsive and severely bradycardic to the 20s.  She was started on external pacing.  She was also noted to be hypoxic and they placed her on supplemental oxygen.  She has not been answering questions during transport.  On later questioning after family arrival, granddaughter notes that the patient is fairly independent although family assists with medication administration and patient has been receiving medications correctly.  She was recently hospitalized for CHF exacerbation.  LEVEL 5 CAVEAT DUE TO AMS  The history is provided by the EMS personnel.    Past Medical History:  Diagnosis Date   Arthritis    Cardiomyopathy    Cecal neoplasm    CHF (congestive heart failure) (HCC)    Colon polyps    Diverticulosis    Hyperlipidemia    Hypertension    Insomnia    Internal hemorrhoids    Left bundle branch block (LBBB) on electrocardiogram    EKG of 5'12 shows 1st degree AV block/LBBB -Epic.    Patient Active Problem List   Diagnosis Date Noted   Hypokalemia 11/26/2018   Acute respiratory failure with hypoxia ([place]) 11/25/2018   Acute on chronic combined systolic (congestive) and diastolic (congestive) heart failure ([place]) 11/24/2018   Hypertensive heart and renal disease [id number]   Diastolic dysfunction [id number]   Absolute anemia 11/01/2018   Acute on chronic diastolic (congestive) heart failure ([place]) 07/28/2018   GERD  (gastroesophageal reflux disease) 07/28/2018   Large bowel diverticular disease 03/06/2018   HLD (hyperlipidemia) 03/06/2018   HTN (hypertension) 03/06/2018   Rectal bleed 03/05/2018   Type 2 diabetes mellitus with stage 2 chronic kidney disease, without long-term current use of insulin ([place]) 12/24/2017   Chronic renal disease, stage II 12/24/2017   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   Estrogen deficiency 12/24/2017   Fracture of superior pubic ramus ([place]) 05/08/2012   Inferior pubic ramus fracture ([place]) 05/08/2012   Osteoarthritis of right knee 08/19/2011   Scoliosis of lumbar spine 05/21/2011   Scoliosis of thoracic spine 05/21/2011    Past Surgical History:  Procedure Laterality Date   ABDOMINAL HYSTERECTOMY     COLON SURGERY     COLONOSCOPY  05/27/2011   Procedure: COLONOSCOPY;  Surgeon: Juanita Craver, MD;  Location: [facility];  Service: Endoscopy;  Laterality: N/A;   COLONOSCOPY WITH PROPOFOL N/A 06/30/2014   Procedure: COLONOSCOPY WITH PROPOFOL;  Surgeon: Juanita Craver, MD;  Location: [facility];  Service: Endoscopy;  Laterality: N/A;     OB History    Gravida  1   Para      Term      Preterm      AB      Living        SAB      TAB      Ectopic      Multiple      Live Births  Home Medications    Prior to Admission medications   Medication Sig Start Date End Date Taking? Authorizing Provider  amLODipine-valsartan (EXFORGE) 10-320 MG tablet Take 1 tablet by mouth daily.   Yes [provider]  carvedilol (COREG) 12.5 MG tablet Take 12.5 mg by mouth 2 (two) times daily with a meal.  11/10/13  Yes [provider]  DEXILANT 60 MG capsule TAKE 1 CAPSULE BY MOUTH EVERY DAY Patient taking differently: Take 60 mg by mouth daily.  11/30/18  Yes Glendale Chard, MD  diazepam (VALIUM) 5 MG tablet Take 2.5 mg by mouth at bedtime as needed for sedation.  12/11/13  Yes [provider]  furosemide  (LASIX) 40 MG tablet Take 0.5-1 tablets (20-40 mg total) by mouth 2 (two) times daily. Take 40mg  in the morning and 20mg  at lunch. Patient taking differently: Take 40 mg by mouth 2 (two) times daily.  11/27/18  Yes Black, Lezlie Octave, NP  guaiFENesin (MUCINEX) 600 MG 12 hr tablet Take 1 tablet (600 mg total) by mouth 2 (two) times daily. Patient taking differently: Take 600 mg by mouth 2 (two) times daily as needed for cough or to loosen phlegm.  07/31/18  Yes Swayze, Ava, DO  ibuprofen (ADVIL) 600 MG tablet Take 600 mg by mouth daily as needed for moderate pain.  10/10/18  Yes [provider]  Iron Polysacch Cmplx-B12-FA (NIFEREX-150 FORTE PO) Take 150 mg by mouth daily.    Yes [provider]  potassium chloride (KLOR-CON) 8 MEQ CR tablet Take 8 mEq by mouth daily.     Yes [provider]  rosuvastatin (CRESTOR) 10 MG tablet Take 10 mg by mouth daily.     Yes [provider]  sacubitril-valsartan (ENTRESTO) 97-103 MG Take 1 tablet by mouth 2 (two) times daily. 11/27/18  Yes Black, Lezlie Octave, NP  Spacer/Aero-Holding Chambers (AEROCHAMBER PLUS) inhaler Use as instructed 10/08/18  Yes Glendale Chard, MD  traMADol (ULTRAM) 50 MG tablet Take 50 mg by mouth as needed for moderate pain.  10/13/17  Yes [provider]  VENTOLIN HFA 108 (90 Base) MCG/ACT inhaler TAKE 2 PUFFS BY MOUTH EVERY 6 HOURS AS NEEDED FOR WHEEZE OR SHORTNESS OF BREATH Patient taking differently: Inhale 2 puffs into the lungs every 6 (six) hours as needed for wheezing or shortness of breath.  11/10/18  Yes Glendale Chard, MD    Family History Family History  Problem Relation Age of Onset   Cancer Mother        COLON   Stroke Father    Diabetes Sister     Social History Social History   Tobacco Use   Smoking status: Never Smoker   Smokeless tobacco: Never Used  Substance Use Topics   Alcohol use: No   Drug use: No     Allergies   Shrimp [shellfish allergy] and  Penicillins   Review of Systems Review of Systems  Unable to perform ROS: Mental status change     Physical Exam Updated Vital Signs BP 110/89    Pulse 98    Temp (!) 95.7 F (35.4 C)    Resp 19    SpO2 100%   Physical Exam Vitals signs and nursing note reviewed.  Constitutional:      Appearance: She is well-developed. She is ill-appearing.     Comments: Somnolent but arousable  HENT:     Head: Normocephalic and atraumatic.  Eyes:     Conjunctiva/sclera: Conjunctivae normal.     Pupils: Pupils  are equal, round, and reactive to light.  Neck:     Musculoskeletal: Neck supple.     Comments: +JVD Cardiovascular:     Comments: Being paced, palpable pulses Pulmonary:     Effort: Pulmonary effort is normal.     Comments: Diminished BS b/l Abdominal:     General: There is distension.     Palpations: Abdomen is soft.     Comments: abd distended but soft; hypoactive BS; no focal tenderness  Musculoskeletal:     Right lower leg: No edema.     Left lower leg: No edema.  Skin:    General: Skin is warm and dry.  Neurological:     Comments: Oriented to person and time      ED Treatments / Results  Labs (all labs ordered are listed, but only abnormal results are displayed) Labs Reviewed  CBC WITH DIFFERENTIAL/PLATELET - Abnormal; Notable for the following components:      Result Value   RDW 17.5 (*)    Platelets 729 (*)    Abs Immature Granulocytes 0.09 (*)    All other components within normal limits  COMPREHENSIVE METABOLIC PANEL - Abnormal; Notable for the following components:   Sodium 134 (*)    Potassium 6.2 (*)    Chloride 97 (*)    CO2 18 (*)    Glucose, Bld 179 (*)    BUN 61 (*)    Creatinine, Ser 3.57 (*)    Calcium 7.1 (*)    Total Protein 5.7 (*)    Albumin 2.8 (*)    AST 133 (*)    ALT 146 (*)    GFR calc non Af Amer 10 (*)    GFR calc Af Amer 12 (*)    Anion gap 19 (*)    All other components within normal limits  LACTIC ACID, PLASMA - Abnormal;  Notable for the following components:   Lactic Acid, Venous 7.1 (*)    All other components within normal limits  URINALYSIS, ROUTINE W REFLEX MICROSCOPIC - Abnormal; Notable for the following components:   APPearance CLOUDY (*)    Glucose, UA 50 (*)    Hgb urine dipstick SMALL (*)    Protein, ur 100 (*)    Bacteria, UA RARE (*)    All other components within normal limits  PROTIME-INR - Abnormal; Notable for the following components:   Prothrombin Time 19.2 (*)    INR 1.6 (*)    All other components within normal limits  I-STAT CHEM 8, ED - Abnormal; Notable for the following components:   Sodium 132 (*)    Potassium 6.0 (*)    BUN 56 (*)    Creatinine, Ser 3.50 (*)    Glucose, Bld 168 (*)    Calcium, Ion 0.88 (*)    TCO2 20 (*)    All other components within normal limits  POC OCCULT BLOOD, ED - Abnormal; Notable for the following components:   Fecal Occult Bld POSITIVE (*)    All other components within normal limits  POCT I-STAT EG7 - Abnormal; Notable for the following components:   pH, Ven 7.176 (*)    pO2, Ven 54.0 (*)    Bicarbonate 18.0 (*)    TCO2 19 (*)    Acid-base deficit 10.0 (*)    Sodium 131 (*)    Potassium 6.0 (*)    Calcium, Ion 0.88 (*)    All other components within normal limits  TROPONIN I (HIGH SENSITIVITY) - Abnormal; Notable  for the following components:   Troponin I (High Sensitivity) 112 (*)    All other components within normal limits  CULTURE, BLOOD (ROUTINE X 2)  CULTURE, BLOOD (ROUTINE X 2)  URINE CULTURE  LIPASE, BLOOD  LACTIC ACID, PLASMA  BLOOD GAS, VENOUS  POC SARS CORONAVIRUS 2 AG -  ED  TYPE AND SCREEN  TROPONIN I (HIGH SENSITIVITY)    EKG None  Radiology Ct Abdomen Pelvis Wo Contrast  Result Date: 12/08/2018 CLINICAL DATA:  85 year old female with abdominal pain. Concern for gastroenteritis versus colitis. EXAM: CT ABDOMEN AND PELVIS WITHOUT CONTRAST TECHNIQUE: Multidetector CT imaging of the abdomen and pelvis was  performed following the standard protocol without IV contrast. COMPARISON:  CT of the abdomen pelvis dated 03/05/2018. FINDINGS: Evaluation of this exam is limited due to severe respiratory motion artifact as well as in the absence of intravenous contrast. Evaluation is also limited due to streak artifact caused by patient's arms. Lower chest: Partially visualized small right and trace left pleural effusions. This is new or increased since the prior CT. Bibasilar airspace densities, right greater left concerning for developing infiltrate. Clinical correlation is recommended. There is cardiomegaly. Coronary vascular calcifications noted. No intra-abdominal free air. Small ascites. Hepatobiliary: The liver is grossly unremarkable. High attenuating content within the gallbladder represents noncalcified stone or sludge. Ultrasound may provide better evaluation of the gallbladder. Pancreas: The pancreas is grossly unremarkable as visualized. Spleen: Normal in size without focal abnormality. Adrenals/Urinary Tract: The adrenal glands are unremarkable. There is no hydronephrosis or nephrolithiasis on either side. The urinary bladder is decompressed around a Foley catheter. Stomach/Bowel: There is sigmoid diverticulosis without active inflammatory changes. The stomach is distended with air and ingested content. The cecum appears to be located in the right upper abdomen. Linear high attenuation in the region of the cecum likely surgical suture. Small clusters of air superior to the gastric antrum (coronal series 6 images 32-44 and axial series 3 images 39-42) appear to be within a tubular structure which measures approximately 11 mm in diameter. This may represent an abnormal appendix. However, I am not able to identified and appendix on the prior CT and therefore findings may represent extraluminal air related to perforation of peptic ulcer. Evaluation however is very limited due to factors above. Clinical correlation is  recommended. Repeat CT with oral contrast may provide better evaluation. There is no bowel obstruction. Vascular/Lymphatic: Advanced aortoiliac atherosclerotic disease. The aorta is tortuous. Reproductive: Hysterectomy. A pessary is noted within pelvis. Other: Diffuse subcutaneous edema. Musculoskeletal: Degenerative changes of the spine. Old healed right pubic bone fractures. No acute osseous pathology. IMPRESSION: 1. Small clusters of air superior to the gastric antrum. Findings may represent an abnormal appendix or extraluminal air related to perforated peptic ulcer. Evaluation is very limited due to respiratory motion artifact. Correlation with clinical exam and history of prior appendectomy recommended. Repeat CT with oral contrast may provide better evaluation. 2. Probable gallbladder sludge or stone. 3. Sigmoid diverticulosis. No bowel obstruction. 4. Small right and trace left pleural effusions with bibasilar airspace densities concerning for developing infiltrate. Clinical correlation is recommended. 5. Aortic Atherosclerosis (ICD10-I70.0). These results were called by telephone at the time of interpretation on 12/08/2018 at 7:29 pm to provider Mariah Aguilar , who verbally acknowledged these results. Electronically Signed   By: Anner Crete M.D.   On: 12/08/2018 19:38   Ct Head Wo Contrast  Result Date: 12/08/2018 CLINICAL DATA:  Altered level of consciousness EXAM: CT HEAD WITHOUT CONTRAST TECHNIQUE: Contiguous  axial images were obtained from the base of the skull through the vertex without intravenous contrast. COMPARISON:  None. FINDINGS: Brain: There is atrophy and chronic small vessel disease changes. No acute intracranial abnormality. Specifically, no hemorrhage, hydrocephalus, mass lesion, acute infarction, or significant intracranial injury. Vascular: No hyperdense vessel or unexpected calcification. Skull: No acute calvarial abnormality. Sinuses/Orbits: Visualized paranasal sinuses and  mastoids clear. Orbital soft tissues unremarkable. Other: None IMPRESSION: Atrophy, chronic microvascular disease. No acute intracranial abnormality. Electronically Signed   By: Rolm Baptise M.D.   On: 12/08/2018 19:17   Dg Chest Portable 1 View  Result Date: 12/08/2018 CLINICAL DATA:  Bradycardia. Respiratory distress. Recent hospitalization. EXAM: PORTABLE CHEST 1 VIEW COMPARISON:  Radiograph 11/24/2018. FINDINGS: Chronic cardiomegaly that is unchanged. Aortic tortuosity and atherosclerosis, also unchanged. Central pulmonary edema slightly worsened from prior exam. There are bilateral pleural effusions and bibasilar opacities. No pneumothorax. No acute osseous abnormalities are seen. IMPRESSION: Slight worsening of pulmonary edema. Cardiomegaly and bilateral pleural effusions are similar to recent exam. Bibasilar opacity, favoring compressive atelectasis. Electronically Signed   By: Keith Rake M.D.   On: 12/08/2018 18:13    Procedures .Critical Care Performed by: Sharlett Iles, MD Authorized by: Sharlett Iles, MD   Critical care provider statement:    Critical care time (minutes):  75   Critical care time was exclusive of:  Separately billable procedures and treating other patients   Critical care was necessary to treat or prevent imminent or life-threatening deterioration of the following conditions:  Cardiac failure and respiratory failure   Critical care was time spent personally by me on the following activities:  Development of treatment plan with patient or surrogate, discussions with consultants, evaluation of patient's response to treatment, examination of patient, obtaining history from patient or surrogate, ordering and performing treatments and interventions, ordering and review of laboratory studies, ordering and review of radiographic studies, re-evaluation of patient's condition, review of old charts and transcutaneous pacing   (including critical care  time)  Medications Ordered in ED Medications  ceFEPIme (MAXIPIME) 2 g in sodium chloride 0.9 % 100 mL IVPB (has no administration in time range)  metroNIDAZOLE (FLAGYL) IVPB 500 mg (has no administration in time range)  vancomycin (VANCOCIN) IVPB 1000 mg/200 mL premix (has no administration in time range)  DOBUTamine (DOBUTREX) infusion 4000 mcg/mL (has no administration in time range)  atropine injection (0.5 mg Intravenous Given 12/08/18 1738)  calcium gluconate inj 10% (1 g) URGENT USE ONLY! (1 g Intravenous Given 12/08/18 1817)  insulin aspart (novoLOG) injection 10 Units (10 Units Intravenous Given 12/08/18 1818)    And  dextrose 50 % solution 50 mL (50 mLs Intravenous Given 12/08/18 1817)  fentaNYL (SUBLIMAZE) injection 100 mcg (100 mcg Intravenous Given 12/08/18 1817)  sodium bicarbonate injection (50 mEq Intravenous Given 12/08/18 1914)  fentaNYL (SUBLIMAZE) injection 100 mcg (100 mcg Intravenous Given 12/08/18 1933)     Initial Impression / Assessment and Plan / ED Course  I have reviewed the triage vital signs and the nursing notes.  Pertinent labs & imaging results that were available during my care of the patient were reviewed by me and considered in my medical decision making (see chart for details).        Ill-appearing on arrival, being externally paced by EMS.  On pacing, she had normal blood pressure.  She was able to state her name and the date correctly.  Protecting airway.  Pause pacing and patient remained severely bradycardic therefore reinitiated external  pacing.  No significant improvement after 0.5 mg atropine x 2.  Lab work notable for venous pH 7.18, normal hemoglobin, potassium 6.2, creatinine 3.57 up from normal creatinine last month, mildly elevated LFTs, anion gap 19,lactate 7.1, troponin 112, INR 1.6.  Patient later noted to have melena.  Chest x-ray shows worsening pulmonary edema with bilateral pleural effusions.  Given her current hypoxia, held off on fluids to  avoid further respiratory compromise as I suspect her acidosis is related to hypoperfusion from bradycardia and heart failure.   Gave fentanyl for pain.  Later initiated dobutamine drip to attempt to improve contractility and heart rate.  Head CT negative acute.  CT of abdomen and pelvis shows some air near stomach which may represent perforated peptic ulcer but exam very limited due to artifact.  Discussed findings with radiologist.  Discussed with general surgeon on-call, Dr. Donne Hazel, who advised medical management only for now given the patient's comorbidities and poor prognosis.  Patient received sepsis protocol antibiotics.  Contacted CCM and discussed w/ Mariah Dibbles, NP as well as Dr. Oletta Darter. Had long discussion with patient's granddaughter who is next of kin regarding pt's critical illness and poor prognosis.  Granddaughter is not ready to make patient DNR/DNI yet but will have discussions with her family members regarding goals of care. PT admitted to MICU in critical condition.   Final Clinical Impressions(s) / ED Diagnoses   Final diagnoses:  Bradycardia  Bowel perforation ([place])  Acute renal failure, unspecified acute renal failure type [facility])    ED Discharge Orders    None       Taraji Mungo, Wenda Overland, MD 12/08/18 2122

## 2018-12-08 NOTE — ED Notes (Signed)
Sister- Jamas Lav would like an update when possible at [phone]

## 2018-12-08 NOTE — ED Notes (Signed)
Pharmacy preparing sodium bicarb infusion now

## 2018-12-09 ENCOUNTER — Inpatient Hospital Stay (HOSPITAL_COMMUNITY): Payer: Medicare Other

## 2018-12-09 DIAGNOSIS — R570 Cardiogenic shock: Secondary | ICD-10-CM

## 2018-12-09 DIAGNOSIS — K631 Perforation of intestine (nontraumatic): Secondary | ICD-10-CM

## 2018-12-09 DIAGNOSIS — E875 Hyperkalemia: Secondary | ICD-10-CM

## 2018-12-09 DIAGNOSIS — N179 Acute kidney failure, unspecified: Secondary | ICD-10-CM

## 2018-12-09 LAB — GLUCOSE, CAPILLARY
Glucose-Capillary: 100 mg/dL — ABNORMAL HIGH (ref 70–99)
Glucose-Capillary: 101 mg/dL — ABNORMAL HIGH (ref 70–99)
Glucose-Capillary: 107 mg/dL — ABNORMAL HIGH (ref 70–99)
Glucose-Capillary: 108 mg/dL — ABNORMAL HIGH (ref 70–99)
Glucose-Capillary: 109 mg/dL — ABNORMAL HIGH (ref 70–99)
Glucose-Capillary: 110 mg/dL — ABNORMAL HIGH (ref 70–99)
Glucose-Capillary: 111 mg/dL — ABNORMAL HIGH (ref 70–99)
Glucose-Capillary: 120 mg/dL — ABNORMAL HIGH (ref 70–99)
Glucose-Capillary: 122 mg/dL — ABNORMAL HIGH (ref 70–99)
Glucose-Capillary: 123 mg/dL — ABNORMAL HIGH (ref 70–99)
Glucose-Capillary: 127 mg/dL — ABNORMAL HIGH (ref 70–99)
Glucose-Capillary: 131 mg/dL — ABNORMAL HIGH (ref 70–99)
Glucose-Capillary: 132 mg/dL — ABNORMAL HIGH (ref 70–99)
Glucose-Capillary: 149 mg/dL — ABNORMAL HIGH (ref 70–99)
Glucose-Capillary: 158 mg/dL — ABNORMAL HIGH (ref 70–99)
Glucose-Capillary: 161 mg/dL — ABNORMAL HIGH (ref 70–99)
Glucose-Capillary: 230 mg/dL — ABNORMAL HIGH (ref 70–99)
Glucose-Capillary: 252 mg/dL — ABNORMAL HIGH (ref 70–99)
Glucose-Capillary: 39 mg/dL — CL (ref 70–99)
Glucose-Capillary: 53 mg/dL — ABNORMAL LOW (ref 70–99)
Glucose-Capillary: 54 mg/dL — ABNORMAL LOW (ref 70–99)
Glucose-Capillary: 67 mg/dL — ABNORMAL LOW (ref 70–99)
Glucose-Capillary: 69 mg/dL — ABNORMAL LOW (ref 70–99)
Glucose-Capillary: 74 mg/dL (ref 70–99)
Glucose-Capillary: 81 mg/dL (ref 70–99)
Glucose-Capillary: 86 mg/dL (ref 70–99)
Glucose-Capillary: 87 mg/dL (ref 70–99)
Glucose-Capillary: 88 mg/dL (ref 70–99)
Glucose-Capillary: 89 mg/dL (ref 70–99)
Glucose-Capillary: 93 mg/dL (ref 70–99)
Glucose-Capillary: 96 mg/dL (ref 70–99)
Glucose-Capillary: 97 mg/dL (ref 70–99)

## 2018-12-09 LAB — MAGNESIUM
Magnesium: 3.1 mg/dL — ABNORMAL HIGH (ref 1.7–2.4)
Magnesium: 3.5 mg/dL — ABNORMAL HIGH (ref 1.7–2.4)

## 2018-12-09 LAB — BASIC METABOLIC PANEL
Anion gap: 11 (ref 5–15)
Anion gap: 13 (ref 5–15)
Anion gap: 17 — ABNORMAL HIGH (ref 5–15)
BUN: 51 mg/dL — ABNORMAL HIGH (ref 8–23)
BUN: 60 mg/dL — ABNORMAL HIGH (ref 8–23)
BUN: 63 mg/dL — ABNORMAL HIGH (ref 8–23)
CO2: 25 mmol/L (ref 22–32)
CO2: 26 mmol/L (ref 22–32)
CO2: 27 mmol/L (ref 22–32)
Calcium: 7.7 mg/dL — ABNORMAL LOW (ref 8.9–10.3)
Calcium: 8 mg/dL — ABNORMAL LOW (ref 8.9–10.3)
Calcium: 8.4 mg/dL — ABNORMAL LOW (ref 8.9–10.3)
Chloride: 91 mmol/L — ABNORMAL LOW (ref 98–111)
Chloride: 92 mmol/L — ABNORMAL LOW (ref 98–111)
Chloride: 94 mmol/L — ABNORMAL LOW (ref 98–111)
Creatinine, Ser: 2.51 mg/dL — ABNORMAL HIGH (ref 0.44–1.00)
Creatinine, Ser: 3.16 mg/dL — ABNORMAL HIGH (ref 0.44–1.00)
Creatinine, Ser: 3.41 mg/dL — ABNORMAL HIGH (ref 0.44–1.00)
GFR calc Af Amer: 13 mL/min — ABNORMAL LOW (ref >60)
GFR calc Af Amer: 14 mL/min — ABNORMAL LOW (ref >60)
GFR calc Af Amer: 18 mL/min — ABNORMAL LOW (ref >60)
GFR calc non Af Amer: 11 mL/min — ABNORMAL LOW (ref >60)
GFR calc non Af Amer: 12 mL/min — ABNORMAL LOW (ref >60)
GFR calc non Af Amer: 16 mL/min — ABNORMAL LOW (ref >60)
Glucose, Bld: 89 mg/dL (ref 70–99)
Glucose, Bld: 90 mg/dL (ref 70–99)
Glucose, Bld: 97 mg/dL (ref 70–99)
Potassium: 3.1 mmol/L — ABNORMAL LOW (ref 3.5–5.1)
Potassium: 3.8 mmol/L (ref 3.5–5.1)
Potassium: 4.4 mmol/L (ref 3.5–5.1)
Sodium: 128 mmol/L — ABNORMAL LOW (ref 135–145)
Sodium: 132 mmol/L — ABNORMAL LOW (ref 135–145)
Sodium: 136 mmol/L (ref 135–145)

## 2018-12-09 LAB — PHOSPHORUS
Phosphorus: 7.9 mg/dL — ABNORMAL HIGH (ref 2.5–4.6)
Phosphorus: 9.4 mg/dL — ABNORMAL HIGH (ref 2.5–4.6)

## 2018-12-09 LAB — POCT I-STAT EG7
Acid-Base Excess: 1 mmol/L (ref 0.0–2.0)
Bicarbonate: 28.6 mmol/L — ABNORMAL HIGH (ref 20.0–28.0)
Calcium, Ion: 0.99 mmol/L — ABNORMAL LOW (ref 1.15–1.40)
HCT: 35 % — ABNORMAL LOW (ref 36.0–46.0)
Hemoglobin: 11.9 g/dL — ABNORMAL LOW (ref 12.0–15.0)
O2 Saturation: 63 %
Patient temperature: 36.9
Potassium: 3.8 mmol/L (ref 3.5–5.1)
Sodium: 132 mmol/L — ABNORMAL LOW (ref 135–145)
TCO2: 30 mmol/L (ref 22–32)
pCO2, Ven: 57.8 mmHg (ref 44.0–60.0)
pH, Ven: 7.302 (ref 7.250–7.430)
pO2, Ven: 36 mmHg (ref 32.0–45.0)

## 2018-12-09 LAB — CBC
HCT: 33.7 % — ABNORMAL LOW (ref 36.0–46.0)
HCT: 37.1 % (ref 36.0–46.0)
Hemoglobin: 11.1 g/dL — ABNORMAL LOW (ref 12.0–15.0)
Hemoglobin: 11.9 g/dL — ABNORMAL LOW (ref 12.0–15.0)
MCH: 27.6 pg (ref 26.0–34.0)
MCH: 26.9 pg (ref 26.0–34.0)
MCHC: 32.9 g/dL (ref 30.0–36.0)
MCHC: 32.1 g/dL (ref 30.0–36.0)
MCV: 83.8 fL (ref 80.0–100.0)
MCV: 83.7 fL (ref 80.0–100.0)
Platelets: 699 10*3/uL — ABNORMAL HIGH (ref 150–400)
Platelets: 744 10*3/uL — ABNORMAL HIGH (ref 150–400)
RBC: 4.02 MIL/uL (ref 3.87–5.11)
RBC: 4.43 MIL/uL (ref 3.87–5.11)
RDW: 17.2 % — ABNORMAL HIGH (ref 11.5–15.5)
RDW: 17.2 % — ABNORMAL HIGH (ref 11.5–15.5)
WBC: 10.1 10*3/uL (ref 4.0–10.5)
WBC: 11.8 10*3/uL — ABNORMAL HIGH (ref 4.0–10.5)
nRBC: 0 % (ref 0.0–0.2)
nRBC: 0 % (ref 0.0–0.2)

## 2018-12-09 LAB — TROPONIN I (HIGH SENSITIVITY): Troponin I (High Sensitivity): 250 ng/L (ref <18)

## 2018-12-09 LAB — URINE CULTURE: Culture: NO GROWTH

## 2018-12-09 LAB — POTASSIUM: Potassium: 3.4 mmol/L — ABNORMAL LOW (ref 3.5–5.1)

## 2018-12-09 LAB — MRSA PCR SCREENING: MRSA by PCR: NEGATIVE

## 2018-12-09 MED ORDER — SODIUM CHLORIDE 0.9% FLUSH
10.0000 mL | Freq: Two times a day (BID) | INTRAVENOUS | Status: DC
  Administered 2018-12-09 – 2018-12-12 (×7): 10 mL

## 2018-12-09 MED ORDER — SODIUM CHLORIDE 0.9% FLUSH: 10.0000 mL | INTRAVENOUS | Status: DC | PRN

## 2018-12-09 MED ORDER — CALCIUM GLUCONATE-NACL 2-0.675 GM/100ML-% IV SOLN
2.0000 g | Freq: Once | INTRAVENOUS | Status: AC
  Administered 2018-12-09: 2000 mg via INTRAVENOUS
  Filled 2018-12-09: qty 100

## 2018-12-09 MED ORDER — POTASSIUM CHLORIDE 10 MEQ/50ML IV SOLN
10.0000 meq | INTRAVENOUS | Status: AC
  Administered 2018-12-09 (×3): 10 meq via INTRAVENOUS
  Filled 2018-12-09 (×3): qty 50

## 2018-12-09 MED ORDER — POTASSIUM CHLORIDE 10 MEQ/50ML IV SOLN
10.0000 meq | Freq: Once | INTRAVENOUS | Status: AC
  Administered 2018-12-09: 10 meq via INTRAVENOUS
  Filled 2018-12-09: qty 50

## 2018-12-09 MED ORDER — SODIUM CHLORIDE 0.9 % IV SOLN
INTRAVENOUS | Status: DC | PRN
  Administered 2018-12-09: 250 mL via INTRAVENOUS

## 2018-12-09 MED ORDER — DEXTROSE 10 % IV SOLN
5.0000 mL/kg/h | INTRAVENOUS | Status: DC
  Administered 2018-12-09: 5 mL/kg/h via INTRAVENOUS

## 2018-12-09 MED ORDER — POTASSIUM CHLORIDE 10 MEQ/50ML IV SOLN: 10.0000 meq | INTRAVENOUS | Status: DC

## 2018-12-09 MED ORDER — CHLORHEXIDINE GLUCONATE CLOTH 2 % EX PADS
6.0000 | MEDICATED_PAD | Freq: Every day | CUTANEOUS | Status: DC
  Administered 2018-12-09 – 2018-12-12 (×3): 6 via TOPICAL

## 2018-12-09 MED ORDER — DEXTROSE 10 % IV SOLN
INTRAVENOUS | Status: DC
  Administered 2018-12-09 (×2): via INTRAVENOUS

## 2018-12-09 MED ORDER — DEXTROSE 10 % IV SOLN
INTRAVENOUS | Status: DC
  Administered 2018-12-09 (×7): via INTRAVENOUS

## 2018-12-09 MED ORDER — CHLORHEXIDINE GLUCONATE CLOTH 2 % EX PADS
6.0000 | MEDICATED_PAD | Freq: Every day | CUTANEOUS | Status: DC
  Administered 2018-12-11: 6 via TOPICAL

## 2018-12-09 MED ORDER — DEXTROSE 50 % IV SOLN: 25.0000 g | INTRAVENOUS | Status: AC

## 2018-12-09 MED ORDER — DEXTROSE 50 % IV SOLN
25.0000 mL | Freq: Once | INTRAVENOUS | Status: AC
  Administered 2018-12-09: 25 mL via INTRAVENOUS

## 2018-12-09 MED ORDER — DEXTROSE 50 % IV SOLN: 25.0000 mL | Freq: Once | INTRAVENOUS | Status: DC

## 2018-12-09 MED ORDER — DEXTROSE 50 % IV SOLN
INTRAVENOUS | Status: AC
  Filled 2018-12-09: qty 50

## 2018-12-09 MED ORDER — DEXTROSE 50 % IV SOLN
25.0000 mL | Freq: Once | INTRAVENOUS | Status: AC
  Administered 2018-12-09: 25 mL via INTRAVENOUS
  Filled 2018-12-09: qty 50

## 2018-12-09 NOTE — Progress Notes (Signed)
PCCM INTERVAL PROGRESS NOTE  Post line CXR noted.   CVP and ABG consistent with venous placement. Have pulled line back 5 CM to 10CM.   Mariah Aguilar, AGACNP-BC [place]  See Amion for personal pager PCCM on call pager [phone]  12/09/2018 2:05 AM

## 2018-12-09 NOTE — Procedures (Signed)
Central Venous Catheter Insertion Procedure Note Mariah Aguilar [id number] [birth year]  Procedure: Insertion of Central Venous Catheter Indications: Assessment of intravascular volume, Drug and/or fluid administration and Frequent blood sampling  Procedure Details Consent: Risks of procedure as well as the alternatives and risks of each were explained to the (patient/caregiver).  Consent for procedure obtained. Time Out: Verified patient identification, verified procedure, site/side was marked, verified correct patient position, special equipment/implants available, medications/allergies/relevent history reviewed, required imaging and test results available.  Performed  Maximum sterile technique was used including antiseptics, cap, gloves, gown, hand hygiene, mask and sheet. Skin prep: Chlorhexidine; local anesthetic administered A antimicrobial bonded/coated triple lumen catheter was placed in the right internal jugular vein using the Seldinger technique. Catheter placed to 15 cm. Blood aspirated via all 3 ports and then flushed x 3. Line sutured x 3 and dressing applied.  Ultrasound guidance used.Yes.    Evaluation Blood flow good Complications: No apparent complications Patient did tolerate procedure well. Chest X-ray ordered to verify placement.  CXR: tip appears to extend into innominate vein. [name], AGACNP-BC [place]  See Amion for personal pager PCCM on call pager [phone]  12/09/2018 1:22 AM

## 2018-12-09 NOTE — Progress Notes (Signed)
NAME:  Mariah Aguilar, MRN:  [id number], DOB:  [birth year], LOS: 1 ADMISSION DATE:  12/08/2018, CONSULTATION DATE:  12/09/18  REFERRING MD:  Bennie Pierini, MD , CHIEF COMPLAINT:  Abdominal pain   Brief History   Mariah Aguilar is a 85 y.o. female with HFrEF (EF 20%), DM2, known diverticular disease, GERD, HLD and HLD presenting with bradycardia (probable complete heart block although no EKG obtained due to presence of transcutaneous pacing on arrival) and abdominal pain with likely perforated duodenal ulcer leading with lactic acidosis, AKI and hyperkalemia 6.2.   She was hospitalized for volume overload from 11/17 - 11/20 recently as well. Upon discharge , she was started on Entresto.   Past Medical History  HFrEF (EF 20%) DM2 Diverticular disease GERD HTN HLD  Consults:  Cardiology (Dr. Terrence Dupont) Gen surgery   Procedures:  RIJ 12/2 >>  Significant Diagnostic Tests:   CT a/p 12/1 showed small clusters of air superior to the gastric antrum concerning for extraluminal air related to perforated peptic ulcer.  Micro Data:  Blood cultures (12/1) >> POC SARS-CoV 2 negative  Antimicrobials:  Vanc x 1 Cefepime 12/1 >> Flagyl 12/1 >  SUBJ -denies abdominal pain or dyspnea No chest pain or dizziness Remains critically ill, on epinephrine drip Hypoglycemic overnight on insulin drip at 1/hour requiring increasing D10 now to 300/hour Poor urine output about 500 last 12 hours  Objective   Blood pressure (!) 162/75, pulse 81, temperature 98.2 F (36.8 C), resp. rate 15, weight 60.6 kg, SpO2 (!) 75 %. CVP:  [17 mmHg-36 mmHg] 26 mmHg      Intake/Output Summary (Last 24 hours) at 12/09/2018 0914 Last data filed at 12/09/2018 0830 Gross per 24 hour  Intake 4028.31 ml  Output 690 ml  Net 3338.31 ml   Filed Weights   12/08/18 2330 12/09/18 0449  Weight: 60.5 kg 60.6 kg    Examination: General: ill-appearing elderly woman HENT: pupils 85mm, reactive, on 6 L nasal  cannula Lungs: scattered rhonchi bilateral, no accessory muscle use Cardiovascular: Bradycardic rhythm high 40s, low 50s Abdomen: distended, soft no bowel sounds auscultated Extremities: 1+ pedal pulses Neuro: Slurred speech due to dry mouth, grossly nonfocal, interactive GU: deferred  Chest x-ray 12/2 personally reviewed which shows cardiomegaly and vascular congestion with small effusions, right IJ coursing in the left innominate vein   Labs show mild hyponatremia, decreasing creatinine to 3.1, potassium decreased to 3.4  EKG personally reviewed which shows sinus bradycardia with left bundle branch block pattern  Assessment & Plan:  Mariah Aguilar is a 85 y.o. female with above medical history admitted with symptomatic bradycardia requiring transcutaneous pacing, likely perforated duodenal ulcer with upper GI bleed, AKI, lactic acidosis and hyperkalemia.  Probable perforated duodenal ulcer with upper GI bleed - Surgery consulted; not surgical candidate - Start IV PPI BID - Continue cefepime, metronidazole  - Trend H&H; if further melena or worsening anemia, will consult GI although interventional options are likely limited - Continue goals of care discussions with family  Cardiogenic shock 2/2 bradycardia while on coreg/hyperkalemic to 6.2, now resolved Sinus bradycardia Lactic acidosis - resolved  - Continue transcutaneous pads but has not needed pacing in last 8 hours -Titrate epinephrine to heart rate 45 and above, not clear why dobutamine was stopped - DC IV insulin given repeated episodes of hypoglycemia at 1 unit/hour -Titrate D10 down once insulin off based on CBGs -Replete hypocalcemia aggressively -Hold Entresto   Acute hypercarbic respiratory failure - Likely opiate-induced -  BiPAP prn now that mental status improved  AKI -improving, likely ATN in setting of valsartan, good prognosis -  Avoid nephrotoxins - Strict I/O - Trend renal function, electrolytes   Toxic-metabolic encephalopathy - - Narcan x1 given - Multifactorial in the setting of acute illness and medications - Hold opiates as able; would be cautious with dosing if necessary  Summary-heart block has improved, now with sinus bradycardia, insulin drip being stopped due to repeated episodes of hypoglycemia, will titrate down epinephrine drip as bradycardia improves Bowel perforation/GI bleed being conservatively managed   Best practice:  Diet: NPO Pain/Anxiety/Delirium protocol (if indicated): N/A VAP protocol (if indicated): N/A DVT prophylaxis: SCDs GI prophylaxis: PPI Glucose control: IV insulin Mobility: Bed rest Code Status: Full Code Family Communication: Granddaughter, [name] Disposition: ICU    The patient is critically ill with multiple organ systems failure and requires high complexity decision making for assessment and support, frequent evaluation and titration of therapies, application of advanced monitoring technologies and extensive interpretation of multiple databases. Critical Care Time devoted to patient care services described in this note independent of APP/resident  time is 35 minutes.   Kara Mead MD. [facility]. [facility]  If no response to pager , please call [phone]   12/09/2018

## 2018-12-09 NOTE — Progress Notes (Signed)
Hypoglycemic Event  CBG: 67  Treatment: D50 25 mL (12.5 gm)  Symptoms: None  Follow-up CBG: Time:0704 CBG Result:111  Possible Reasons for Event: Medication High dose insulin  Comments/MD notified:Elink notified. D10 gtt rate increased    Lauro Regulus

## 2018-12-09 NOTE — Progress Notes (Signed)
eLink Physician-Brief Progress Note Patient Name: Mariah Aguilar DOB: Jun 01, 1922 MRN: [id number]   Date of Service  12/09/2018  HPI/Events of Note  Hypoglycemia - On insulin IV and D10W Rx for possible B-Blocker overdose.   eICU Interventions  Will order: 1. Increase D10W IV infusion from 100 to 200 mL/hour.  2. Continue to trend blood glucose.      Intervention Category Major Interventions: Other:  Sommer,Steven Glendola Copa 12/09/2018, 3:07 AM

## 2018-12-09 NOTE — Progress Notes (Signed)
Hypoglycemic Event  CBG: 53  Treatment: D50 25 mL (12.5 gm)  Symptoms: None  Follow-up CBG: Time:0315 CBG Result:100  Possible Reasons for Event: Medication regimen: High dose insulin  Comments/MD notified:ELink notified. D10 gtt rate modified    Lauro Regulus

## 2018-12-09 NOTE — Progress Notes (Signed)
eLink Physician-Brief Progress Note Patient Name: Mariah Aguilar DOB: [birth year] MRN: [id number]   Date of Service  12/09/2018  HPI/Events of Note  Asked to review CXR s/p R IJ CVL placement - R IJ CVL tip looks like it goes up into the Innominate vein.  eICU Interventions  Ground team asked to review film and adjust R IJ CVL if possible.      Intervention Category Intermediate Interventions: Diagnostic test evaluation  [name] 12/09/2018, 1:19 AM

## 2018-12-09 NOTE — Progress Notes (Signed)
Subjective/Chief Complaint: Pt off TCP No c/o abd pain Con't on pressors   Objective: Vital signs in last 24 hours: Temp:  [93.5 F (34.2 C)-98.8 F (37.1 C)] 98.2 F (36.8 C) (12/02 0715) Pulse Rate:  [27-108] 41 (12/02 0715) Resp:  [12-26] 13 (12/02 0715) BP: (87-153)/(53-91) 153/71 (12/02 0700) SpO2:  [66 %-100 %] 83 % (12/02 0715) Weight:  [60.5 kg-60.6 kg] 60.6 kg (12/02 0449) Last BM Date: 12/08/18  Intake/Output from previous day: 12/01 0701 - 12/02 0700 In: 3560.2 [I.V.:3010.3; IV Piggyback:549.9] Out: 515 [Urine:515] Intake/Output this shift: No intake/output data recorded.  Constitutional: No acute distress, conversant, appears states age. Eyes: Anicteric sclerae, moist conjunctiva, no lid lag Lungs: Clear to auscultation bilaterally, normal respiratory effort CV: brady, regular rhythm,  no peripheral edema, pedal pulses 2+ GI: Soft, no masses or hepatosplenomegaly, non-tender to palpation Skin: No rashes, palpation reveals normal turgor Psychiatric: appropriate judgment and insight, oriented to person, place, and time   Lab Results:  Recent Labs    12/08/18 2333 12/09/18 0138 12/09/18 0353  WBC 11.1*  --  10.1  HGB 12.0 11.9* 11.1*  HCT 37.8 35.0* 33.7*  PLT 733*  --  699*   BMET Recent Labs    12/08/18 2333 12/09/18 0138 12/09/18 0353  NA 136 132* 132*  K 4.4 3.8 3.8  CL 94*  --  92*  CO2 25  --  27  GLUCOSE 97  --  89  BUN 63*  --  60*  CREATININE 3.41*  --  3.16*  CALCIUM 8.0*  --  7.7*   PT/INR Recent Labs    12/08/18 1755  LABPROT 19.2*  INR 1.6*   ABG Recent Labs    12/08/18 2051 12/08/18 2245 12/09/18 0138  PHART 7.269* 7.364  --   HCO3 27.4 27.0 28.6*    Studies/Results: Ct Abdomen Pelvis Wo Contrast  Result Date: 12/08/2018 CLINICAL DATA:  85 year old female with abdominal pain. Concern for gastroenteritis versus colitis. EXAM: CT ABDOMEN AND PELVIS WITHOUT CONTRAST TECHNIQUE: Multidetector CT imaging of the  abdomen and pelvis was performed following the standard protocol without IV contrast. COMPARISON:  CT of the abdomen pelvis dated 03/05/2018. FINDINGS: Evaluation of this exam is limited due to severe respiratory motion artifact as well as in the absence of intravenous contrast. Evaluation is also limited due to streak artifact caused by patient's arms. Lower chest: Partially visualized small right and trace left pleural effusions. This is new or increased since the prior CT. Bibasilar airspace densities, right greater left concerning for developing infiltrate. Clinical correlation is recommended. There is cardiomegaly. Coronary vascular calcifications noted. No intra-abdominal free air. Small ascites. Hepatobiliary: The liver is grossly unremarkable. High attenuating content within the gallbladder represents noncalcified stone or sludge. Ultrasound may provide better evaluation of the gallbladder. Pancreas: The pancreas is grossly unremarkable as visualized. Spleen: Normal in size without focal abnormality. Adrenals/Urinary Tract: The adrenal glands are unremarkable. There is no hydronephrosis or nephrolithiasis on either side. The urinary bladder is decompressed around a Foley catheter. Stomach/Bowel: There is sigmoid diverticulosis without active inflammatory changes. The stomach is distended with air and ingested content. The cecum appears to be located in the right upper abdomen. Linear high attenuation in the region of the cecum likely surgical suture. Small clusters of air superior to the gastric antrum (coronal series 6 images 32-44 and axial series 3 images 39-42) appear to be within a tubular structure which measures approximately 11 mm in diameter. This may represent an abnormal  appendix. However, I am not able to identified and appendix on the prior CT and therefore findings may represent extraluminal air related to perforation of peptic ulcer. Evaluation however is very limited due to factors above.  Clinical correlation is recommended. Repeat CT with oral contrast may provide better evaluation. There is no bowel obstruction. Vascular/Lymphatic: Advanced aortoiliac atherosclerotic disease. The aorta is tortuous. Reproductive: Hysterectomy. A pessary is noted within pelvis. Other: Diffuse subcutaneous edema. Musculoskeletal: Degenerative changes of the spine. Old healed right pubic bone fractures. No acute osseous pathology. IMPRESSION: 1. Small clusters of air superior to the gastric antrum. Findings may represent an abnormal appendix or extraluminal air related to perforated peptic ulcer. Evaluation is very limited due to respiratory motion artifact. Correlation with clinical exam and history of prior appendectomy recommended. Repeat CT with oral contrast may provide better evaluation. 2. Probable gallbladder sludge or stone. 3. Sigmoid diverticulosis. No bowel obstruction. 4. Small right and trace left pleural effusions with bibasilar airspace densities concerning for developing infiltrate. Clinical correlation is recommended. 5. Aortic Atherosclerosis (ICD10-I70.0). These results were called by telephone at the time of interpretation on 12/08/2018 at 7:29 pm to provider RACHEL LITTLE , who verbally acknowledged these results. Electronically Signed   By: Anner Crete M.D.   On: 12/08/2018 19:38   Ct Head Wo Contrast  Result Date: 12/08/2018 CLINICAL DATA:  Altered level of consciousness EXAM: CT HEAD WITHOUT CONTRAST TECHNIQUE: Contiguous axial images were obtained from the base of the skull through the vertex without intravenous contrast. COMPARISON:  None. FINDINGS: Brain: There is atrophy and chronic small vessel disease changes. No acute intracranial abnormality. Specifically, no hemorrhage, hydrocephalus, mass lesion, acute infarction, or significant intracranial injury. Vascular: No hyperdense vessel or unexpected calcification. Skull: No acute calvarial abnormality. Sinuses/Orbits: Visualized  paranasal sinuses and mastoids clear. Orbital soft tissues unremarkable. Other: None IMPRESSION: Atrophy, chronic microvascular disease. No acute intracranial abnormality. Electronically Signed   By: Rolm Baptise M.D.   On: 12/08/2018 19:17   Dg Chest Port 1 View  Result Date: 12/09/2018 CLINICAL DATA:  Central line placement EXAM: PORTABLE CHEST 1 VIEW COMPARISON:  12/08/2018 FINDINGS: Right central line is been place which crosses into the left innominate vein. No pneumothorax. Cardiomegaly with vascular congestion. Bibasilar atelectasis with small effusions. No acute bony abnormality. IMPRESSION: Right central line courses across the midline into the left innominate vein. No pneumothorax. Cardiomegaly, vascular congestion. Small effusions with bibasilar atelectasis. Electronically Signed   By: Rolm Baptise M.D.   On: 12/09/2018 01:21   Dg Chest Portable 1 View  Result Date: 12/08/2018 CLINICAL DATA:  Bradycardia. Respiratory distress. Recent hospitalization. EXAM: PORTABLE CHEST 1 VIEW COMPARISON:  Radiograph 11/24/2018. FINDINGS: Chronic cardiomegaly that is unchanged. Aortic tortuosity and atherosclerosis, also unchanged. Central pulmonary edema slightly worsened from prior exam. There are bilateral pleural effusions and bibasilar opacities. No pneumothorax. No acute osseous abnormalities are seen. IMPRESSION: Slight worsening of pulmonary edema. Cardiomegaly and bilateral pleural effusions are similar to recent exam. Bibasilar opacity, favoring compressive atelectasis. Electronically Signed   By: Keith Rake M.D.   On: 12/08/2018 18:13    Anti-infectives: Anti-infectives (From admission, onward)   Start     Dose/Rate Route Frequency Ordered Stop   12/09/18 2000  ceFEPIme (MAXIPIME) 1 g in sodium chloride 0.9 % 100 mL IVPB     1 g 200 mL/hr over 30 Minutes Intravenous Every 24 hours 12/08/18 2147     12/09/18 0430  metroNIDAZOLE (FLAGYL) IVPB 500 mg  500 mg 100 mL/hr over 60 Minutes  Intravenous Every 8 hours 12/08/18 2031     12/08/18 1930  ceFEPIme (MAXIPIME) 2 g in sodium chloride 0.9 % 100 mL IVPB     2 g 200 mL/hr over 30 Minutes Intravenous  Once 12/08/18 1928 12/08/18 2116   12/08/18 1930  metroNIDAZOLE (FLAGYL) IVPB 500 mg     500 mg 100 mL/hr over 60 Minutes Intravenous  Once 12/08/18 1928 12/08/18 2238   12/08/18 1930  vancomycin (VANCOCIN) IVPB 1000 mg/200 mL premix     1,000 mg 200 mL/hr over 60 Minutes Intravenous  Once 12/08/18 1928 12/08/18 2238      Assessment/Plan: 25F with min free air on CT Cardiogenic shock Resp failure AKI  -At this time pt w/o abd pain.  Pt is not a surgical candidate at this moment, and family does not want surgery.  If there was a perf ulcer, likely autosealed.  Would rec abx.  UGI with PO contrast to eval for leak and PO would be appropriate in 3-4days. -No acute surgical plans -Please call if can be of further  Assistance    LOS: 1 day    Ralene Ok 12/09/2018

## 2018-12-09 NOTE — Progress Notes (Signed)
Hypoglycemic Event  CBG: 69  Treatment: D50 25 mL (12.5 gm)  Symptoms: None  Follow-up CBG: [id number] CBG Result:122  Possible Reasons for Event: Medication regimen: D10 gtt  Comments/MD notified:Elink notified    Lauro Regulus

## 2018-12-09 NOTE — Progress Notes (Signed)
Subjective:  Patient denies any chest pain nor shortness of breath.  Complains of vague abdominal pain.  Heart rate is improved off transcutaneous pacing.  Urine output has improved.  Patient more alert, awake.  Objective:  Vital Signs in the last 24 hours: Temp:  [93.5 F (34.2 C)-98.8 F (37.1 C)] 98.4 F (36.9 C) (12/02 0945) Pulse Rate:  [27-108] 80 (12/02 0945) Resp:  [12-26] 13 (12/02 0945) BP: (87-162)/(53-91) 148/66 (12/02 0930) SpO2:  [64 %-100 %] 84 % (12/02 0945) Weight:  [60.5 kg-60.6 kg] 60.6 kg (12/02 0449)  Intake/Output from previous day: 12/01 0701 - 12/02 0700 In: 3560.2 [I.V.:3010.3; IV Piggyback:549.9] Out: 515 [Urine:515] Intake/Output from this shift: Total I/O In: 468.1 [I.V.:468.1] Out: 175 [Urine:175]  Physical Exam: Neck: no adenopathy, no carotid bruit, no JVD and supple, symmetrical, trachea midline Lungs: decreased breath sounds at bases Heart: Bradycardic, S1, S2.  Soft 2/6 systolic murmur noted Abdomen: soft, bowel sounds present.  Mild generalized tenderness  No guarding Extremities: extremities normal, atraumatic, no cyanosis or edema  Lab Results: Recent Labs    12/08/18 2333 12/09/18 0138 12/09/18 0353  WBC 11.1*  --  10.1  HGB 12.0 11.9* 11.1*  PLT 733*  --  699*   Recent Labs    12/08/18 2333 12/09/18 0138 12/09/18 0353 12/09/18 0652  NA 136 132* 132*  --   K 4.4 3.8 3.8 3.4*  CL 94*  --  92*  --   CO2 25  --  27  --   GLUCOSE 97  --  89  --   BUN 63*  --  60*  --   CREATININE 3.41*  --  3.16*  --    No results for input(s): TROPONINI in the last 72 hours.  Invalid input(s): CK, MB Hepatic Function Panel Recent Labs    12/08/18 1755  PROT 5.7*  ALBUMIN 2.8*  AST 133*  ALT 146*  ALKPHOS 52  BILITOT 0.5   No results for input(s): CHOL in the last 72 hours. No results for input(s): PROTIME in the last 72 hours.  Imaging: Imaging results have been reviewed and Ct Abdomen Pelvis Wo Contrast  Result Date:  12/08/2018 CLINICAL DATA:  85 year old female with abdominal pain. Concern for gastroenteritis versus colitis. EXAM: CT ABDOMEN AND PELVIS WITHOUT CONTRAST TECHNIQUE: Multidetector CT imaging of the abdomen and pelvis was performed following the standard protocol without IV contrast. COMPARISON:  CT of the abdomen pelvis dated 03/05/2018. FINDINGS: Evaluation of this exam is limited due to severe respiratory motion artifact as well as in the absence of intravenous contrast. Evaluation is also limited due to streak artifact caused by patient's arms. Lower chest: Partially visualized small right and trace left pleural effusions. This is new or increased since the prior CT. Bibasilar airspace densities, right greater left concerning for developing infiltrate. Clinical correlation is recommended. There is cardiomegaly. Coronary vascular calcifications noted. No intra-abdominal free air. Small ascites. Hepatobiliary: The liver is grossly unremarkable. High attenuating content within the gallbladder represents noncalcified stone or sludge. Ultrasound may provide better evaluation of the gallbladder. Pancreas: The pancreas is grossly unremarkable as visualized. Spleen: Normal in size without focal abnormality. Adrenals/Urinary Tract: The adrenal glands are unremarkable. There is no hydronephrosis or nephrolithiasis on either side. The urinary bladder is decompressed around a Foley catheter. Stomach/Bowel: There is sigmoid diverticulosis without active inflammatory changes. The stomach is distended with air and ingested content. The cecum appears to be located in the right upper abdomen. Linear high attenuation  in the region of the cecum likely surgical suture. Small clusters of air superior to the gastric antrum (coronal series 6 images 32-44 and axial series 3 images 39-42) appear to be within a tubular structure which measures approximately 11 mm in diameter. This may represent an abnormal appendix. However, I am not  able to identified and appendix on the prior CT and therefore findings may represent extraluminal air related to perforation of peptic ulcer. Evaluation however is very limited due to factors above. Clinical correlation is recommended. Repeat CT with oral contrast may provide better evaluation. There is no bowel obstruction. Vascular/Lymphatic: Advanced aortoiliac atherosclerotic disease. The aorta is tortuous. Reproductive: Hysterectomy. A pessary is noted within pelvis. Other: Diffuse subcutaneous edema. Musculoskeletal: Degenerative changes of the spine. Old healed right pubic bone fractures. No acute osseous pathology. IMPRESSION: 1. Small clusters of air superior to the gastric antrum. Findings may represent an abnormal appendix or extraluminal air related to perforated peptic ulcer. Evaluation is very limited due to respiratory motion artifact. Correlation with clinical exam and history of prior appendectomy recommended. Repeat CT with oral contrast may provide better evaluation. 2. Probable gallbladder sludge or stone. 3. Sigmoid diverticulosis. No bowel obstruction. 4. Small right and trace left pleural effusions with bibasilar airspace densities concerning for developing infiltrate. Clinical correlation is recommended. 5. Aortic Atherosclerosis (ICD10-I70.0). These results were called by telephone at the time of interpretation on 12/08/2018 at 7:29 pm to provider RACHEL LITTLE , who verbally acknowledged these results. Electronically Signed   By: Anner Crete M.D.   On: 12/08/2018 19:38   Ct Head Wo Contrast  Result Date: 12/08/2018 CLINICAL DATA:  Altered level of consciousness EXAM: CT HEAD WITHOUT CONTRAST TECHNIQUE: Contiguous axial images were obtained from the base of the skull through the vertex without intravenous contrast. COMPARISON:  None. FINDINGS: Brain: There is atrophy and chronic small vessel disease changes. No acute intracranial abnormality. Specifically, no hemorrhage,  hydrocephalus, mass lesion, acute infarction, or significant intracranial injury. Vascular: No hyperdense vessel or unexpected calcification. Skull: No acute calvarial abnormality. Sinuses/Orbits: Visualized paranasal sinuses and mastoids clear. Orbital soft tissues unremarkable. Other: None IMPRESSION: Atrophy, chronic microvascular disease. No acute intracranial abnormality. Electronically Signed   By: Rolm Baptise M.D.   On: 12/08/2018 19:17   Dg Chest Port 1 View  Result Date: 12/09/2018 CLINICAL DATA:  Central line placement EXAM: PORTABLE CHEST 1 VIEW COMPARISON:  12/08/2018 FINDINGS: Right central line is been place which crosses into the left innominate vein. No pneumothorax. Cardiomegaly with vascular congestion. Bibasilar atelectasis with small effusions. No acute bony abnormality. IMPRESSION: Right central line courses across the midline into the left innominate vein. No pneumothorax. Cardiomegaly, vascular congestion. Small effusions with bibasilar atelectasis. Electronically Signed   By: Rolm Baptise M.D.   On: 12/09/2018 01:21   Dg Chest Portable 1 View  Result Date: 12/08/2018 CLINICAL DATA:  Bradycardia. Respiratory distress. Recent hospitalization. EXAM: PORTABLE CHEST 1 VIEW COMPARISON:  Radiograph 11/24/2018. FINDINGS: Chronic cardiomegaly that is unchanged. Aortic tortuosity and atherosclerosis, also unchanged. Central pulmonary edema slightly worsened from prior exam. There are bilateral pleural effusions and bibasilar opacities. No pneumothorax. No acute osseous abnormalities are seen. IMPRESSION: Slight worsening of pulmonary edema. Cardiomegaly and bilateral pleural effusions are similar to recent exam. Bibasilar opacity, favoring compressive atelectasis. Electronically Signed   By: Keith Rake M.D.   On: 12/08/2018 18:13    Cardiac Studies:  Assessment/Plan:  Status post symptomatic bradycardia secondary to meds. Acute on chronic decompensated systolic congestive heart  failure. Valvular heart disease. Status post acute upper GI bleed complicated by probably perforated duodenal ulcer secondary to NSAIDs use. Resolving Acute renal injury precipitated by hypotension/Entresto Metabolic acidosis. Hypertension. Diabetes mellitus. Hyperlipidemia. Degenerative joint disease. Plan Continue present management per CCM  LOS: 1 day    Charolette Forward 12/09/2018, 9:52 AM

## 2018-12-09 NOTE — Progress Notes (Signed)
K 3.1. Dr Elsworth Soho notified. Came to bedside to speak with son on rounds. New orders for potassium replacement and explained need for pt to remain strict NPO for bowel perforation concern.

## 2018-12-10 LAB — GLUCOSE, CAPILLARY
Glucose-Capillary: 139 mg/dL — ABNORMAL HIGH (ref 70–99)
Glucose-Capillary: 140 mg/dL — ABNORMAL HIGH (ref 70–99)
Glucose-Capillary: 145 mg/dL — ABNORMAL HIGH (ref 70–99)
Glucose-Capillary: 155 mg/dL — ABNORMAL HIGH (ref 70–99)
Glucose-Capillary: 156 mg/dL — ABNORMAL HIGH (ref 70–99)
Glucose-Capillary: 157 mg/dL — ABNORMAL HIGH (ref 70–99)
Glucose-Capillary: 158 mg/dL — ABNORMAL HIGH (ref 70–99)
Glucose-Capillary: 158 mg/dL — ABNORMAL HIGH (ref 70–99)
Glucose-Capillary: 159 mg/dL — ABNORMAL HIGH (ref 70–99)
Glucose-Capillary: 162 mg/dL — ABNORMAL HIGH (ref 70–99)
Glucose-Capillary: 163 mg/dL — ABNORMAL HIGH (ref 70–99)
Glucose-Capillary: 165 mg/dL — ABNORMAL HIGH (ref 70–99)
Glucose-Capillary: 169 mg/dL — ABNORMAL HIGH (ref 70–99)
Glucose-Capillary: 171 mg/dL — ABNORMAL HIGH (ref 70–99)
Glucose-Capillary: 172 mg/dL — ABNORMAL HIGH (ref 70–99)
Glucose-Capillary: 77 mg/dL (ref 70–99)
Glucose-Capillary: >600 mg/dL (ref 70–99)

## 2018-12-10 LAB — BASIC METABOLIC PANEL
Anion gap: 8 (ref 5–15)
BUN: 38 mg/dL — ABNORMAL HIGH (ref 8–23)
CO2: 27 mmol/L (ref 22–32)
Calcium: 7.2 mg/dL — ABNORMAL LOW (ref 8.9–10.3)
Chloride: 90 mmol/L — ABNORMAL LOW (ref 98–111)
Creatinine, Ser: 1.89 mg/dL — ABNORMAL HIGH (ref 0.44–1.00)
GFR calc Af Amer: 26 mL/min — ABNORMAL LOW (ref >60)
GFR calc non Af Amer: 22 mL/min — ABNORMAL LOW (ref >60)
Glucose, Bld: 163 mg/dL — ABNORMAL HIGH (ref 70–99)
Potassium: 3.7 mmol/L (ref 3.5–5.1)
Sodium: 125 mmol/L — ABNORMAL LOW (ref 135–145)

## 2018-12-10 LAB — CBC
HCT: 36.8 % (ref 36.0–46.0)
HCT: 38.8 % (ref 36.0–46.0)
Hemoglobin: 12.1 g/dL (ref 12.0–15.0)
Hemoglobin: 12.8 g/dL (ref 12.0–15.0)
MCH: 27.4 pg (ref 26.0–34.0)
MCH: 27.4 pg (ref 26.0–34.0)
MCHC: 32.9 g/dL (ref 30.0–36.0)
MCHC: 33 g/dL (ref 30.0–36.0)
MCV: 83.4 fL (ref 80.0–100.0)
MCV: 83.1 fL (ref 80.0–100.0)
Platelets: 611 10*3/uL — ABNORMAL HIGH (ref 150–400)
Platelets: 671 10*3/uL — ABNORMAL HIGH (ref 150–400)
RBC: 4.41 MIL/uL (ref 3.87–5.11)
RBC: 4.67 MIL/uL (ref 3.87–5.11)
RDW: 17.1 % — ABNORMAL HIGH (ref 11.5–15.5)
RDW: 17.6 % — ABNORMAL HIGH (ref 11.5–15.5)
WBC: 8.2 10*3/uL (ref 4.0–10.5)
WBC: 9.6 10*3/uL (ref 4.0–10.5)
nRBC: 0 % (ref 0.0–0.2)
nRBC: 0 % (ref 0.0–0.2)

## 2018-12-10 LAB — MAGNESIUM: Magnesium: 2.4 mg/dL (ref 1.7–2.4)

## 2018-12-10 LAB — PHOSPHORUS: Phosphorus: 4.4 mg/dL (ref 2.5–4.6)

## 2018-12-10 MED ORDER — PANTOPRAZOLE SODIUM 40 MG PO TBEC
40.0000 mg | DELAYED_RELEASE_TABLET | Freq: Two times a day (BID) | ORAL | Status: DC
  Administered 2018-12-10 – 2018-12-13 (×7): 40 mg via ORAL
  Filled 2018-12-10 (×4): qty 1
  Filled 2018-12-10: qty 2
  Filled 2018-12-10 (×2): qty 1

## 2018-12-10 MED ORDER — CEFDINIR 300 MG PO CAPS
300.0000 mg | ORAL_CAPSULE | Freq: Every day | ORAL | Status: DC
  Administered 2018-12-10 – 2018-12-11 (×2): 300 mg via ORAL
  Filled 2018-12-10 (×2): qty 1

## 2018-12-10 MED ORDER — ISOSORB DINITRATE-HYDRALAZINE 20-37.5 MG PO TABS
0.5000 | ORAL_TABLET | Freq: Two times a day (BID) | ORAL | Status: DC
  Administered 2018-12-10 – 2018-12-11 (×4): 0.5 via ORAL
  Filled 2018-12-10 (×4): qty 1

## 2018-12-10 NOTE — Progress Notes (Signed)
Subjective:  Patient denies any chest pain or shortness of breath states feeling much better up in chair now.  Patient is off inotropes sinus bradycardia on the monitor heart rate in 50s.  Renal function and urine output improved  Objective:  Vital Signs in the last 24 hours: Temp:  [98.4 F (36.9 C)-99.1 F (37.3 C)] 99 F (37.2 C) (12/03 0800) Pulse Rate:  [34-94] 65 (12/03 0800) Resp:  [13-24] 17 (12/03 0500) BP: (96-149)/(54-77) 111/72 (12/03 0800) SpO2:  [71 %-100 %] 98 % (12/03 0800) Weight:  [65.4 kg] 65.4 kg (12/03 0600)  Intake/Output from previous day: 12/02 0701 - 12/03 0700 In: 6131.6 [I.V.:5594.8; IV Piggyback:536.8] Out: 3725 [Urine:3725] Intake/Output from this shift: No intake/output data recorded.  Physical Exam: Neck: no adenopathy, no carotid bruit, no JVD and supple, symmetrical, trachea midline Lungs: Decreased breath sound at bases Heart: Bradycardic [id number] soft 2/6 systolic murmur noted Abdomen: Soft bowel sounds present mild generalized tenderness no guarding Extremities: No clubbing cyanosis trace edema noted  Lab Results: Recent Labs    12/09/18 1423 12/10/18 0300  WBC 11.8* 8.2  HGB 11.9* 12.1  PLT 744* 611*   Recent Labs    12/09/18 1423 12/10/18 0300  NA 128* 125*  K 3.1* 3.7  CL 91* 90*  CO2 26 27  GLUCOSE 90 163*  BUN 51* 38*  CREATININE 2.51* 1.89*   No results for input(s): TROPONINI in the last 72 hours.  Invalid input(s): CK, MB Hepatic Function Panel Recent Labs    12/08/18 1755  PROT 5.7*  ALBUMIN 2.8*  AST 133*  ALT 146*  ALKPHOS 52  BILITOT 0.5   No results for input(s): CHOL in the last 72 hours. No results for input(s): PROTIME in the last 72 hours.  Imaging: Imaging results have been reviewed and Ct Abdomen Pelvis Wo Contrast  Result Date: 12/08/2018 CLINICAL DATA:  85 year old female with abdominal pain. Concern for gastroenteritis versus colitis. EXAM: CT ABDOMEN AND PELVIS WITHOUT CONTRAST TECHNIQUE:  Multidetector CT imaging of the abdomen and pelvis was performed following the standard protocol without IV contrast. COMPARISON:  CT of the abdomen pelvis dated 03/05/2018. FINDINGS: Evaluation of this exam is limited due to severe respiratory motion artifact as well as in the absence of intravenous contrast. Evaluation is also limited due to streak artifact caused by patient's arms. Lower chest: Partially visualized small right and trace left pleural effusions. This is new or increased since the prior CT. Bibasilar airspace densities, right greater left concerning for developing infiltrate. Clinical correlation is recommended. There is cardiomegaly. Coronary vascular calcifications noted. No intra-abdominal free air. Small ascites. Hepatobiliary: The liver is grossly unremarkable. High attenuating content within the gallbladder represents noncalcified stone or sludge. Ultrasound may provide better evaluation of the gallbladder. Pancreas: The pancreas is grossly unremarkable as visualized. Spleen: Normal in size without focal abnormality. Adrenals/Urinary Tract: The adrenal glands are unremarkable. There is no hydronephrosis or nephrolithiasis on either side. The urinary bladder is decompressed around a Foley catheter. Stomach/Bowel: There is sigmoid diverticulosis without active inflammatory changes. The stomach is distended with air and ingested content. The cecum appears to be located in the right upper abdomen. Linear high attenuation in the region of the cecum likely surgical suture. Small clusters of air superior to the gastric antrum (coronal series 6 images 32-44 and axial series 3 images 39-42) appear to be within a tubular structure which measures approximately 11 mm in diameter. This may represent an abnormal appendix. However, I am not able  to identified and appendix on the prior CT and therefore findings may represent extraluminal air related to perforation of peptic ulcer. Evaluation however is very  limited due to factors above. Clinical correlation is recommended. Repeat CT with oral contrast may provide better evaluation. There is no bowel obstruction. Vascular/Lymphatic: Advanced aortoiliac atherosclerotic disease. The aorta is tortuous. Reproductive: Hysterectomy. A pessary is noted within pelvis. Other: Diffuse subcutaneous edema. Musculoskeletal: Degenerative changes of the spine. Old healed right pubic bone fractures. No acute osseous pathology. IMPRESSION: 1. Small clusters of air superior to the gastric antrum. Findings may represent an abnormal appendix or extraluminal air related to perforated peptic ulcer. Evaluation is very limited due to respiratory motion artifact. Correlation with clinical exam and history of prior appendectomy recommended. Repeat CT with oral contrast may provide better evaluation. 2. Probable gallbladder sludge or stone. 3. Sigmoid diverticulosis. No bowel obstruction. 4. Small right and trace left pleural effusions with bibasilar airspace densities concerning for developing infiltrate. Clinical correlation is recommended. 5. Aortic Atherosclerosis (ICD10-I70.0). These results were called by telephone at the time of interpretation on 12/08/2018 at 7:29 pm to provider RACHEL LITTLE , who verbally acknowledged these results. Electronically Signed   By: Anner Crete M.D.   On: 12/08/2018 19:38   Ct Head Wo Contrast  Result Date: 12/08/2018 CLINICAL DATA:  Altered level of consciousness EXAM: CT HEAD WITHOUT CONTRAST TECHNIQUE: Contiguous axial images were obtained from the base of the skull through the vertex without intravenous contrast. COMPARISON:  None. FINDINGS: Brain: There is atrophy and chronic small vessel disease changes. No acute intracranial abnormality. Specifically, no hemorrhage, hydrocephalus, mass lesion, acute infarction, or significant intracranial injury. Vascular: No hyperdense vessel or unexpected calcification. Skull: No acute calvarial abnormality.  Sinuses/Orbits: Visualized paranasal sinuses and mastoids clear. Orbital soft tissues unremarkable. Other: None IMPRESSION: Atrophy, chronic microvascular disease. No acute intracranial abnormality. Electronically Signed   By: Rolm Baptise M.D.   On: 12/08/2018 19:17   Dg Chest Port 1 View  Result Date: 12/09/2018 CLINICAL DATA:  Central line placement EXAM: PORTABLE CHEST 1 VIEW COMPARISON:  12/08/2018 FINDINGS: Right central line is been place which crosses into the left innominate vein. No pneumothorax. Cardiomegaly with vascular congestion. Bibasilar atelectasis with small effusions. No acute bony abnormality. IMPRESSION: Right central line courses across the midline into the left innominate vein. No pneumothorax. Cardiomegaly, vascular congestion. Small effusions with bibasilar atelectasis. Electronically Signed   By: Rolm Baptise M.D.   On: 12/09/2018 01:21   Dg Chest Portable 1 View  Result Date: 12/08/2018 CLINICAL DATA:  Bradycardia. Respiratory distress. Recent hospitalization. EXAM: PORTABLE CHEST 1 VIEW COMPARISON:  Radiograph 11/24/2018. FINDINGS: Chronic cardiomegaly that is unchanged. Aortic tortuosity and atherosclerosis, also unchanged. Central pulmonary edema slightly worsened from prior exam. There are bilateral pleural effusions and bibasilar opacities. No pneumothorax. No acute osseous abnormalities are seen. IMPRESSION: Slight worsening of pulmonary edema. Cardiomegaly and bilateral pleural effusions are similar to recent exam. Bibasilar opacity, favoring compressive atelectasis. Electronically Signed   By: Keith Rake M.D.   On: 12/08/2018 18:13    Cardiac Studies:  Assessment/Plan:  Status post symptomatic bradycardia secondary to meds. Acute on chronic decompensated systolic congestive heart failure. Valvular heart disease. Status post acute upper GI bleed complicated by probably perforated duodenal ulcer secondary to NSAIDs use. Resolving Acute renal injury  precipitated by hypotension/Entresto Metabolic acidosis. Hypertension. Diabetes mellitus. Hyperlipidemia. Degenerative joint disease. Plan Continue present management per CCM Add low-dose BiDil as per orders Check labs in a.m.  LOS: 2 days    Charolette Forward 12/10/2018, 10:21 AM

## 2018-12-10 NOTE — Progress Notes (Signed)
NAME:  Mariah Aguilar, MRN:  [id number], DOB:  12/23/1922, LOS: 2 ADMISSION DATE:  12/08/2018, CONSULTATION DATE:  12/10/18  REFERRING MD:  Bennie Pierini, MD , CHIEF COMPLAINT:  Abdominal pain   Brief History   Mariah Aguilar is a 85 y.o. female with HFrEF (EF 20%), DM2, known diverticular disease, GERD, HLD and HLD presenting with bradycardia (probable complete heart block although no EKG obtained due to presence of transcutaneous pacing on arrival) and abdominal pain with likely perforated duodenal ulcer leading with lactic acidosis, AKI and hyperkalemia 6.2.   She was hospitalized for volume overload from 11/17 - 11/20 recently as well. Upon discharge , she was started on Entresto.  Past Medical History  HFrEF (EF 20%) DM2 Diverticular disease GERD HTN HLD  Course in hospital:   CT a/p 12/1 showed small clusters of air superior to the gastric antrum concerning for extraluminal air related to perforated peptic ulcer. Patient has refused pacemaker and deemed not to be a surgical candidate   Subjective:  Denies pain and states that she wants to go home.  Objective   Blood pressure 124/65, pulse (!) 49, temperature 98.6 F (37 C), resp. rate 17, weight 65.4 kg, SpO2 96 %. CVP:  [18 mmHg-27 mmHg] 20 mmHg      Intake/Output Summary (Last 24 hours) at 12/10/2018 0840 Last data filed at 12/10/2018 0700 Gross per 24 hour  Intake 5663.45 ml  Output 3550 ml  Net 2113.45 ml   Filed Weights   12/08/18 2330 12/09/18 0449 12/10/18 0600  Weight: 60.5 kg 60.6 kg 65.4 kg    Examination: General: ill-appearing elderly woman HENT: pupils 64mm, reactive, on room air Lungs: Clear Cardiovascular:HS normal HR in 50's Abdomen: distended, soft  + bowel sounds auscultated Extremities: 1+ pedal pulses Neuro: normal speech due to dry mouth, grossly nonfocal, interactive, but poor insight. GU: deferred   Assessment & Plan:  Mariah Aguilar is a 85 y.o. female with above  medical history admitted with symptomatic bradycardia requiring transcutaneous pacing, likely perforated duodenal ulcer with upper GI bleed, AKI, lactic acidosis and hyperkalemia.  Perforated duodenal ulcer with upper GI bleed - lactic acidosis has resolved. Cardiogenic shock 2/2 bradycardia while on coreg/hyperkalemic to 6.2, now resolved - likely secondary to above and medication accumulation du to renal failure. AKI -improving, likely ATN in setting of valsartan, good prognosis Toxic-metabolic encephalopathy sensorium clearing.  She is generally improving and wishes limited interventions and to go home.  As she has been stable for 48h, will advance diet and ambulate in preparation for discharge. Code status should be re-addressed given her stated desire for limited interventions.  Daily Goals Checklist  Pain/Anxiety/Delirium protocol (if indicated): no pain requirements VAP protocol (if indicated): not intubated Respiratory support goals: Ambulation. Blood pressure target: SBP <160.  DVT prophylaxis: UHF  Nutritional status and feeding goals: Full liquid diet GI prophylaxis: BID protonix orally Fluid status goals: allow autoregulation Urinary catheter: Remove foley Central lines: Remove Central line Glucose control: Euglycemic.   Mobility/therapy needs: Ambulate - discharge planning. Antibiotic de-escalation: cefdinir Home medication reconciliation: DC home carvedilol, resume home medications tomorrow. Daily labs: BMP in am Code Status: Full code, will need to readdress given that she has refused surgery or pacemaker. Family Communication: Will need to be updated. Disposition: Transfer to floor once off [place], MD [facility] ICU Physician [place]  Pager: [phone] Mobile: [phone] After hours: [phone].  12/10/2018, 9:04 AM  12/10/2018    

## 2018-12-10 NOTE — Progress Notes (Signed)
[facility] RN aware of order to d/c CVC.

## 2018-12-10 NOTE — Progress Notes (Signed)
Please disregard blood glucose reading high >600

## 2018-12-10 NOTE — Consult Note (Signed)
   [facility] CM Inpatient Consult   12/10/2018  Mariah Aguilar 08-07-1922 [id number]  Patient screened for readmission within the past 30 days hospitalizations to check if potential [place] Management services.    Review of patient's medical record reveals patient is in the Medicare ACO.  Primary Care Provider is Dr. Glendale Chard, this is an [place] office and this writer will follow up with  the Chronic Care Management team to make aware of readmission.  She had recently declined care management services noted.  Patient admitted with symptomatic bradycardia  Pharmacy is: [facility]:  Will follow up with inpatient [facility] team for disposition needs. Continue to follow progress and disposition to assess for post hospital care management needs.    Please place a [facility] Care Management consult as appropriate and for questions contact:   Natividad Brood, RN BSN [facility] Liaison  [phone] business mobile phone Toll free office [phone]  Fax number: [phone] [email or web address]

## 2018-12-11 ENCOUNTER — Inpatient Hospital Stay (HOSPITAL_COMMUNITY): Payer: Medicare Other

## 2018-12-11 ENCOUNTER — Other Ambulatory Visit: Payer: Self-pay

## 2018-12-11 DIAGNOSIS — E872 Acidosis: Secondary | ICD-10-CM

## 2018-12-11 LAB — BASIC METABOLIC PANEL
Anion gap: 8 (ref 5–15)
BUN: 21 mg/dL (ref 8–23)
CO2: 27 mmol/L (ref 22–32)
Calcium: 7.9 mg/dL — ABNORMAL LOW (ref 8.9–10.3)
Chloride: 99 mmol/L (ref 98–111)
Creatinine, Ser: 1.22 mg/dL — ABNORMAL HIGH (ref 0.44–1.00)
GFR calc Af Amer: 44 mL/min — ABNORMAL LOW (ref >60)
GFR calc non Af Amer: 38 mL/min — ABNORMAL LOW (ref >60)
Glucose, Bld: 133 mg/dL — ABNORMAL HIGH (ref 70–99)
Potassium: 3.7 mmol/L (ref 3.5–5.1)
Sodium: 134 mmol/L — ABNORMAL LOW (ref 135–145)

## 2018-12-11 LAB — CBC
HCT: 38.4 % (ref 36.0–46.0)
Hemoglobin: 12.7 g/dL (ref 12.0–15.0)
MCH: 27.1 pg (ref 26.0–34.0)
MCHC: 33.1 g/dL (ref 30.0–36.0)
MCV: 82.1 fL (ref 80.0–100.0)
Platelets: 641 10*3/uL — ABNORMAL HIGH (ref 150–400)
RBC: 4.68 MIL/uL (ref 3.87–5.11)
RDW: 17.5 % — ABNORMAL HIGH (ref 11.5–15.5)
WBC: 8.1 10*3/uL (ref 4.0–10.5)
nRBC: 0 % (ref 0.0–0.2)

## 2018-12-11 LAB — GLUCOSE, CAPILLARY
Glucose-Capillary: 116 mg/dL — ABNORMAL HIGH (ref 70–99)
Glucose-Capillary: 160 mg/dL — ABNORMAL HIGH (ref 70–99)
Glucose-Capillary: 121 mg/dL — ABNORMAL HIGH (ref 70–99)

## 2018-12-11 MED ORDER — CEFDINIR 300 MG PO CAPS
300.0000 mg | ORAL_CAPSULE | Freq: Two times a day (BID) | ORAL | Status: DC
  Administered 2018-12-11 – 2018-12-13 (×4): 300 mg via ORAL
  Filled 2018-12-11 (×5): qty 1

## 2018-12-11 MED ORDER — AMIODARONE HCL IN DEXTROSE 360-4.14 MG/200ML-% IV SOLN
INTRAVENOUS | Status: AC
  Filled 2018-12-11: qty 400

## 2018-12-11 MED ORDER — AMIODARONE HCL IN DEXTROSE 360-4.14 MG/200ML-% IV SOLN: 30.0000 mg/h | INTRAVENOUS | Status: DC

## 2018-12-11 MED ORDER — IOHEXOL 300 MG/ML  SOLN
150.0000 mL | Freq: Once | INTRAMUSCULAR | Status: AC | PRN
  Administered 2018-12-11: 150 mL via ORAL

## 2018-12-11 MED ORDER — AMIODARONE HCL IN DEXTROSE 360-4.14 MG/200ML-% IV SOLN
60.0000 mg/h | INTRAVENOUS | Status: DC
  Administered 2018-12-11 (×2): 60 mg/h via INTRAVENOUS

## 2018-12-11 MED ORDER — AMIODARONE LOAD VIA INFUSION
150.0000 mg | Freq: Once | INTRAVENOUS | Status: AC
  Administered 2018-12-11: 150 mg via INTRAVENOUS
  Filled 2018-12-11: qty 83.34

## 2018-12-11 MED ORDER — AMIODARONE HCL 100 MG PO TABS
100.0000 mg | ORAL_TABLET | Freq: Every day | ORAL | Status: DC
  Administered 2018-12-11 – 2018-12-13 (×3): 100 mg via ORAL
  Filled 2018-12-11 (×3): qty 1

## 2018-12-11 MED ORDER — BISACODYL 5 MG PO TBEC: 10.0000 mg | DELAYED_RELEASE_TABLET | Freq: Every day | ORAL | Status: DC | PRN

## 2018-12-11 MED ORDER — AMIODARONE LOAD VIA INFUSION: 150.0000 mg | Freq: Once | INTRAVENOUS | Status: DC

## 2018-12-11 NOTE — Progress Notes (Signed)
PCCM to [facility] transfer:  Patient with h/o HTN; HLD; diverticular disease; and HFrEF (EF 20-25%) who was admitted to [facility] from 11/18-20 for acute respiratory failure associated with CHF exacerbation, diuresed and started on Entresto.  She returned on 12/1 with 3 days of abdominal pain.  She became unresponsive with EMS with HR 22, paced at a rate of 60 with improved mental status.  She was thought to have a perforated duodenal ulcer with lactate acidosis, AKI, and hyperkalemia 6.2.  Conservative management, not a surgical candidate.  UGI negative, eating now.  Bradycardia resolved with insulin and stopping BB.  Likely needs ongoing monitoring for a few days, antibiotics.  Limited code - pressors ok, no CPR.  Has improved significantly, likely will be ready for d/c in a couple of days.  [facility] will assume care on 12/5.  Carlyon Shadow, M.D.

## 2018-12-11 NOTE — Progress Notes (Signed)
Subjective:  Patient denies any chest pain or shortness of breath.  Denies any palpitations.  Had episode of A. Fib with RVR requiring IV amiodarone with conversion to sinus rhythm.  Also occasional few beats of nonsustained VT on the monitor  Objective:  Vital Signs in the last 24 hours: Temp:  [97.7 F (36.5 C)-98.5 F (36.9 C)] 98.5 F (36.9 C) (12/04 0730) Pulse Rate:  [51-130] 55 (12/04 1000) Resp:  [13-28] 20 (12/04 1000) BP: (97-151)/(53-81) 113/53 (12/04 1000) SpO2:  [91 %-100 %] 97 % (12/04 1000)  Intake/Output from previous day: 12/03 0701 - 12/04 0700 In: 525.5 [I.V.:525.5] Out: 2850 [Urine:2850] Intake/Output from this shift: Total I/O In: 170.2 [P.O.:120; I.V.:50.2] Out: -   Physical Exam: Neck: no adenopathy, no carotid bruit, no JVD and supple, symmetrical, trachea midline Lungs: decreased breath sounds at bases with faint wheezing Heart: regular rate and rhythm, S1, S2 normal and 2/6 systolic murmur noted Abdomen: soft, non-tender; bowel sounds normal; no masses,  no organomegaly Extremities: extremities normal, atraumatic, no cyanosis or edema  Lab Results: Recent Labs    12/10/18 1547 12/11/18 0323  WBC 9.6 8.1  HGB 12.8 12.7  PLT 671* 641*   Recent Labs    12/10/18 0300 12/11/18 0323  NA 125* 134*  K 3.7 3.7  CL 90* 99  CO2 27 27  GLUCOSE 163* 133*  BUN 38* 21  CREATININE 1.89* 1.22*   No results for input(s): TROPONINI in the last 72 hours.  Invalid input(s): CK, MB Hepatic Function Panel Recent Labs    12/08/18 1755  PROT 5.7*  ALBUMIN 2.8*  AST 133*  ALT 146*  ALKPHOS 52  BILITOT 0.5   No results for input(s): CHOL in the last 72 hours. No results for input(s): PROTIME in the last 72 hours.  Imaging: Imaging results have been reviewed and No results found.  Cardiac Studies:  Assessment/Plan:  Status post symptomatic bradycardia secondary to meds. Acute on chronic decompensated systolic congestive heart failure. Valvular  heart disease. Status post paroxysmal A. Fib with RVR/nonsustained.  Few beats of V. tach Status post acute upper GI bleed complicated by probably perforated duodenal ulcer secondary to NSAIDs use. Resolving Acute renal injury precipitated by hypotension/Entresto Metabolic acidosis. Hypertension. Diabetes mellitus. Hyperlipidemia. Degenerative joint disease. Plan DC IV amiodarone. Start amiodarone 100 mg daily  Check EKG in a.m.  LOS: 3 days    Charolette Forward 12/11/2018, 11:34 AM

## 2018-12-11 NOTE — Progress Notes (Signed)
Dr. Terrence Dupont notified by this RN of patients recent rhythm change. See orders.

## 2018-12-11 NOTE — Progress Notes (Signed)
eLink Physician-Brief Progress Note Patient Name: Mariah Aguilar DOB: April 05, 1922 MRN: [id number]   Date of Service  12/11/2018  HPI/Events of Note  Informed of tachycardia HR in the 130s. No significant electrolyte abnormality except  For Na 125. H/H stable.  eICU Interventions  Cardiology informed prior to my assessment and has ordered amiodarone drip     Intervention Category Major Interventions: Arrhythmia - evaluation and management  Shona Needles Tamaiya Bump 12/11/2018, 1:02 AM

## 2018-12-11 NOTE — Progress Notes (Signed)
NAME:  Mariah Aguilar, MRN:  [id number], DOB:  1922-08-07, LOS: 3 ADMISSION DATE:  12/08/2018, CONSULTATION DATE:  12/11/18  REFERRING MD:  Bennie Pierini, MD , CHIEF COMPLAINT:  Abdominal pain   Brief History   Mariah Aguilar is a 85 y.o. female with HFrEF (EF 20%), DM2, known diverticular disease, GERD, HLD and HLD presenting with bradycardia (probable complete heart block although no EKG obtained due to presence of transcutaneous pacing on arrival) and abdominal pain with likely perforated duodenal ulcer leading with lactic acidosis, AKI and hyperkalemia 6.2.   She was hospitalized for volume overload from 11/17 - 11/20 recently as well. Upon discharge , she was started on Entresto.   Past Medical History  HFrEF (EF 20%) DM2 Diverticular disease GERD HTN HLD  Consults:  Cardiology (Dr. Terrence Dupont) Gen surgery   Procedures:  RIJ 12/2 >>  Significant Diagnostic Tests:   CT a/p 12/1 showed small clusters of air superior to the gastric antrum concerning for extraluminal air related to perforated peptic ulcer.  Upper GI series 12/4 >> no leak Micro Data:  Blood cultures (12/1) >>ng POC SARS-CoV 2 negative  Antimicrobials:  Vanc x 1 Cefepime 12/1 >> Flagyl 12/1 >  SUBJ -out of bed to chair Afebrile Denies abdominal pain, complains of constipation  Objective   Blood pressure (!) 113/58, pulse (!) 53, temperature 98.1 F (36.7 C), temperature source Oral, resp. rate 17, weight 65.4 kg, SpO2 99 %.        Intake/Output Summary (Last 24 hours) at 12/11/2018 1510 Last data filed at 12/11/2018 1200 Gross per 24 hour  Intake 845.65 ml  Output 3550 ml  Net -2704.35 ml   Filed Weights   12/08/18 2330 12/09/18 0449 12/10/18 0600  Weight: 60.5 kg 60.6 kg 65.4 kg    Examination: General: well-appearing elderly woman, no distress HENT: pupils 33mm, reactive, mild pallor, no icterus Lungs: Creased breath sounds bilateral, no accessory muscle use Cardiovascular:  S1-S2 regular sinus on monitor Abdomen: distended, soft no bowel sounds auscultated Extremities: 1+ pedal pulses Neuro: Alert and interactive, normal speech grossly nonfocal   Chest x-ray 12/2 personally reviewed which shows cardiomegaly and vascular congestion with small effusions, right IJ coursing in the left innominate vein   Labs show mild hyponatremia, decreasing creatinine to 1.2, normal electrolytes  EKG personally reviewed which shows sinus bradycardia with left bundle branch block pattern, occasional PVC  Assessment & Plan:  Mariah Aguilar is a 85 y.o. female with above medical history admitted with symptomatic bradycardia requiring transcutaneous pacing, likely perforated duodenal ulcer with upper GI bleed, AKI, lactic acidosis and hyperkalemia.  Probable perforated duodenal ulcer with upper GI bleed - Surgery consulted; not surgical candidate - Start IV PPI BID - Continue cefepime x 5 days total,dc  metronidazole  -H&H stable   Cardiogenic shock 2/2 bradycardia while on coreg/hyperkalemic to 6.2, now resolved Sinus bradycardia  Episode of atrial fibrillation/RVR- NSVT -changed from IV to p.o. amnio per cardiology   AKI -improving, likely ATN in setting of valsartan,  -  Avoid nephrotoxins - Strict I/O - Trend renal function, electrolytes -Hold Entresto   Resolved problems-  Lactic acidosis - resolved  Acute hypercarbic respiratory failure - Likely opiate-induced  Toxic-metabolic encephalopathy - - Narcan x1 given - Multifactorial in the setting of acute illness and medications - Hold opiates as able; would be cautious with dosing if necessary  Transfer to telemetry and to triad 12/5  Kara Mead MD. [facility]. [place] [facility]  If no response to pager , please call [phone]   12/11/2018

## 2018-12-11 NOTE — Progress Notes (Signed)
Attempted to call report at [place] RN asked to call back as she was still receiving report.

## 2018-12-11 NOTE — Evaluation (Signed)
Physical Therapy Evaluation Patient Details Name: Mariah Aguilar MRN: [id number] DOB: 11-11-1922 Today's Date: 12/11/2018   History of Present Illness  85 y.o. female with HFrEF (EF 20%), DM2, known diverticular disease, GERD, HLD and HLD presenting with bradycardia (probable complete heart block although no EKG obtained due to presence of transcutaneous pacing on arrival) and abdominal pain with likely perforated duodenal ulcer leading with lactic acidosis, AKI and hyperkalemia 6.2. Pt requesting for limited interventions and to return home.  Clinical Impression  Pt presents to PT with no significant deficits compared to baseline. At baseline pt ambulates with supervision and UE support of furniture at home, or HHA from family in the community. Pt is able to ambulate at a supervision level with use of RW currently, and is encouraged to utilize RW for mobility upon discharge home, as she is much more steady with use of this device. Pt is encouraged to ambulate multiple times per day for the remainder of her hospitalization with assistance of family or staff. Pt and family express no concern for patients current mobility level and both report it is near her functional baseline. Acute PT signing off, no further PT or DME needs.    Follow Up Recommendations No PT follow up    Equipment Recommendations  None recommended by PT(pt owns necessary equipment)    Recommendations for Other Services       Precautions / Restrictions Precautions Precautions: Fall Restrictions Weight Bearing Restrictions: No      Mobility  Bed Mobility Overal bed mobility: (pt received and left in recliner)                Transfers Overall transfer level: Needs assistance Equipment used: Rolling walker (2 wheeled) Transfers: Sit to/from Stand Sit to Stand: Supervision         General transfer comment: pt requires minG for sit to stand with HHA  Ambulation/Gait Ambulation/Gait assistance:  Supervision;Min assist(supervision with RW, minG-minA with HHA) Gait Distance (Feet): 60 Feet(60' x2 trials) Assistive device: Rolling walker (2 wheeled);1 person hand held assist Gait Pattern/deviations: Step-to pattern;Decreased step length - right;Decreased step length - left;Drifts right/left;Shuffle Gait velocity: reduced Gait velocity interpretation: <1.8 ft/sec, indicate of risk for recurrent falls General Gait Details: pt with shortened step to gait pattern with increased sway, pt requires unilateral UE support to maintain balance and is more steady with use of RW  Stairs            Wheelchair Mobility    Modified Rankin (Stroke Patients Only)       Balance Overall balance assessment: Needs assistance Sitting-balance support: No upper extremity supported;Feet supported Sitting balance-Leahy Scale: Good Sitting balance - Comments: supervision   Standing balance support: Single extremity supported Standing balance-Leahy Scale: Fair Standing balance comment: minG                             Pertinent Vitals/Pain Pain Assessment: No/denies pain    Home Living Family/patient expects to be discharged to:: Private residence Living Arrangements: Children Available Help at Discharge: Family Type of Home: House Home Access: Level entry     Home Layout: One level Home Equipment: Environmental consultant - 2 wheels;Cane - single point      Prior Function Level of Independence: Needs assistance   Gait / Transfers Assistance Needed: pt ambulates household distances independently with support of furniture. Pt utilizes HHA of family memeber for community mobility  Hand Dominance        Extremity/Trunk Assessment   Upper Extremity Assessment Upper Extremity Assessment: Overall WFL for tasks assessed    Lower Extremity Assessment Lower Extremity Assessment: Overall WFL for tasks assessed    Cervical / Trunk Assessment Cervical / Trunk Assessment:  Kyphotic  Communication   Communication: No difficulties  Cognition Arousal/Alertness: Awake/alert Behavior During Therapy: WFL for tasks assessed/performed Overall Cognitive Status: Within Functional Limits for tasks assessed                                        General Comments General comments (skin integrity, edema, etc.): VSS    Exercises     Assessment/Plan    PT Assessment Patent does not need any further PT services  PT Problem List         PT Treatment Interventions      PT Goals (Current goals can be found in the Care Plan section)       Frequency     Barriers to discharge        Co-evaluation               AM-PAC PT "6 Clicks" Mobility  Outcome Measure Help needed turning from your back to your side while in a flat bed without using bedrails?: None Help needed moving from lying on your back to sitting on the side of a flat bed without using bedrails?: None Help needed moving to and from a bed to a chair (including a wheelchair)?: None Help needed standing up from a chair using your arms (e.g., wheelchair or bedside chair)?: None Help needed to walk in hospital room?: None Help needed climbing 3-5 steps with a railing? : A Little 6 Click Score: 23    End of Session Equipment Utilized During Treatment: (none) Activity Tolerance: Patient tolerated treatment well Patient left: in chair;with call bell/phone within reach;with family/visitor present Nurse Communication: Mobility status      Time: [id number] PT Time Calculation (min) (ACUTE ONLY): 15 min   Charges:   PT Evaluation $PT Eval Low Complexity: 1 Low          Zenaida Niece, PT, DPT Acute Rehabilitation Pager: [phone]   Zenaida Niece 12/11/2018, 5:12 PM

## 2018-12-12 DIAGNOSIS — I4891 Unspecified atrial fibrillation: Secondary | ICD-10-CM

## 2018-12-12 DIAGNOSIS — R001 Bradycardia, unspecified: Secondary | ICD-10-CM

## 2018-12-12 DIAGNOSIS — J9602 Acute respiratory failure with hypercapnia: Secondary | ICD-10-CM

## 2018-12-12 DIAGNOSIS — G92 Toxic encephalopathy: Secondary | ICD-10-CM

## 2018-12-12 LAB — BASIC METABOLIC PANEL
Anion gap: 8 (ref 5–15)
BUN: 13 mg/dL (ref 8–23)
CO2: 30 mmol/L (ref 22–32)
Calcium: 7.9 mg/dL — ABNORMAL LOW (ref 8.9–10.3)
Chloride: 101 mmol/L (ref 98–111)
Creatinine, Ser: 0.97 mg/dL (ref 0.44–1.00)
GFR calc Af Amer: 58 mL/min — ABNORMAL LOW (ref >60)
GFR calc non Af Amer: 50 mL/min — ABNORMAL LOW (ref >60)
Glucose, Bld: 118 mg/dL — ABNORMAL HIGH (ref 70–99)
Potassium: 3.9 mmol/L (ref 3.5–5.1)
Sodium: 139 mmol/L (ref 135–145)

## 2018-12-12 LAB — CBC
HCT: 38.2 % (ref 36.0–46.0)
Hemoglobin: 12.1 g/dL (ref 12.0–15.0)
MCH: 26.9 pg (ref 26.0–34.0)
MCHC: 31.7 g/dL (ref 30.0–36.0)
MCV: 84.9 fL (ref 80.0–100.0)
Platelets: 595 10*3/uL — ABNORMAL HIGH (ref 150–400)
RBC: 4.5 MIL/uL (ref 3.87–5.11)
RDW: 18 % — ABNORMAL HIGH (ref 11.5–15.5)
WBC: 9 10*3/uL (ref 4.0–10.5)
nRBC: 0 % (ref 0.0–0.2)

## 2018-12-12 MED ORDER — ISOSORB DINITRATE-HYDRALAZINE 20-37.5 MG PO TABS: 0.5000 | ORAL_TABLET | Freq: Three times a day (TID) | ORAL | 0 refills | Status: DC

## 2018-12-12 MED ORDER — AMIODARONE HCL 100 MG PO TABS: 100.0000 mg | ORAL_TABLET | Freq: Every day | ORAL | 0 refills | Status: AC

## 2018-12-12 MED ORDER — PREDNISONE 50 MG PO TABS: 60.0000 mg | ORAL_TABLET | Freq: Every day | ORAL | Status: DC

## 2018-12-12 MED ORDER — FUROSEMIDE 20 MG PO TABS
20.0000 mg | ORAL_TABLET | Freq: Every day | ORAL | Status: DC
  Administered 2018-12-12 – 2018-12-13 (×2): 20 mg via ORAL
  Filled 2018-12-12 (×2): qty 1

## 2018-12-12 MED ORDER — ISOSORB DINITRATE-HYDRALAZINE 20-37.5 MG PO TABS
0.5000 | ORAL_TABLET | Freq: Three times a day (TID) | ORAL | Status: DC
  Administered 2018-12-12 – 2018-12-13 (×3): 0.5 via ORAL
  Filled 2018-12-12 (×3): qty 1

## 2018-12-12 MED ORDER — FLUTICASONE FUROATE-VILANTEROL 200-25 MCG/INH IN AEPB: 1.0000 | INHALATION_SPRAY | Freq: Every day | RESPIRATORY_TRACT | 0 refills | Status: DC

## 2018-12-12 MED ORDER — FLUTICASONE FUROATE-VILANTEROL 200-25 MCG/INH IN AEPB
1.0000 | INHALATION_SPRAY | Freq: Every day | RESPIRATORY_TRACT | Status: DC
  Administered 2018-12-13: 1 via RESPIRATORY_TRACT
  Filled 2018-12-12: qty 28

## 2018-12-12 MED ORDER — CEFDINIR 300 MG PO CAPS: 300.0000 mg | ORAL_CAPSULE | Freq: Two times a day (BID) | ORAL | 0 refills | Status: AC

## 2018-12-12 NOTE — Plan of Care (Signed)

## 2018-12-12 NOTE — Progress Notes (Addendum)
PROGRESS NOTE  Mariah Aguilar [id number] DOB: [birth year] DOA: 12/08/2018 PCP: Glendale Chard, MD  Brief History   Patient with h/o HTN; HLD; diverticular disease; and HFrEF (EF 20-25%) who was admitted to [facility] from 11/18-20 for acute respiratory failure associated with CHF exacerbation, diuresed and started on Entresto.  She returned on 12/1 with 3 days of abdominal pain.  She became unresponsive with EMS with HR 22, paced at a rate of 60 with improved mental status.  She was thought to have a perforated duodenal ulcer with lactate acidosis, AKI, and hyperkalemia 6.2.  Conservative management, not a surgical candidate.  UGI negative, eating now.  Bradycardia resolved with insulin and stopping BB.  Likely needs ongoing monitoring for a few days, antibiotics.  Limited code - pressors ok, no CPR.  Has improved significantly, likely will be ready for d/c in a couple of days.   She has been transferred to [facility] and telemetry on 12/12/2018.  Consultants  . Cardiology . General Surgery . Critical Care  Procedures  . Transcutaneous pacing  Antibiotics   Anti-infectives (From admission, onward)   Start     Dose/Rate Route Frequency Ordered Stop   12/12/18 0000  cefdinir (OMNICEF) 300 MG capsule     300 mg Oral Every 12 hours 12/12/18 1145 12/17/18 2359   12/11/18 2200  cefdinir (OMNICEF) capsule 300 mg     300 mg Oral Every 12 hours 12/11/18 1011     12/10/18 1000  cefdinir (OMNICEF) capsule 300 mg  Status:  Discontinued     300 mg Oral Daily 12/10/18 0851 12/11/18 1011   12/09/18 2000  ceFEPIme (MAXIPIME) 1 g in sodium chloride 0.9 % 100 mL IVPB  Status:  Discontinued     1 g 200 mL/hr over 30 Minutes Intravenous Every 24 hours 12/08/18 2147 12/10/18 0851   12/09/18 0430  metroNIDAZOLE (FLAGYL) IVPB 500 mg  Status:  Discontinued     500 mg 100 mL/hr over 60 Minutes Intravenous Every 8 hours 12/08/18 2031 12/10/18 0851   12/08/18 1930  ceFEPIme (MAXIPIME) 2 g in sodium  chloride 0.9 % 100 mL IVPB     2 g 200 mL/hr over 30 Minutes Intravenous  Once 12/08/18 1928 12/08/18 2116   12/08/18 1930  metroNIDAZOLE (FLAGYL) IVPB 500 mg     500 mg 100 mL/hr over 60 Minutes Intravenous  Once 12/08/18 1928 12/08/18 2238   12/08/18 1930  vancomycin (VANCOCIN) IVPB 1000 mg/200 mL premix     1,000 mg 200 mL/hr over 60 Minutes Intravenous  Once 12/08/18 1928 12/08/18 2238    .  Subjective  The patient is resting comfortably. She was demanding to go home today. I explained that I needed to see how she walked off of oxygen and to be evaluated by PE. She misunderstood and then thought that she would go home tomorrow. This afternoon she has been very confused. Discharge is cancelled for today. Family is aware.  Objective   Vitals:  Vitals:   12/12/18 0808 12/12/18 1115  BP: 127/68 (!) 150/94  Pulse: 80 85  Resp: (!) 23 (!) 23  Temp: 98 F (36.7 C) 98 F (36.7 C)  SpO2: 100% 92%    Exam:  Constitutional:  . The patient is awake and alert. She is confused. She is very hard of hearing. Eyes:  . PERRLA . EOMBI Respiratory:  . No increased work of breathing. Marland Kitchen Positive for wheezing throughout. . No rales or rhonchi. . No tactile fremitus  Cardiovascular:  . RRR, no m/r/g . No LE extremity edema   . Normal pedal pulses Abdomen:  . Abdomen appears normal; no tenderness or masses . No hernias . No HSM Musculoskeletal:  . No cyanosis . No clubbing . No edema of lower extremities Skin:  . No rashes, lesions, ulcers . palpation of skin: no induration or nodules Neurologic:  . The patient is unable to cooperate with exam. Psychiatric:  . The patient is confused.    I have personally reviewed the following:   Today's Data  . BMP, CBC  Micro Data  . Blood cultures x 2 have had no growth.  Scheduled Meds: . amiodarone  100 mg Oral Daily  . cefdinir  300 mg Oral Q12H  . Chlorhexidine Gluconate Cloth  6 each Topical Daily  . Chlorhexidine Gluconate  Cloth  6 each Topical Daily  . fluticasone furoate-vilanterol  1 puff Inhalation Daily  . furosemide  20 mg Oral Daily  . isosorbide-hydrALAZINE  0.5 tablet Oral TID  . pantoprazole  40 mg Oral BID  . sodium chloride flush  10-40 mL Intracatheter Q12H   Continuous Infusions: . sodium chloride Stopped (12/09/18 0956)    Active Problems:   Perforated bowel (HCC)   LOS: 4 days   A & P  Probable perforated duodenal ulcer with upper GI bleed: Surgery has been consulted. The patient is deemed not to be a surgical candidate. She is receiving protonix IV bid. She has received a total of 5 days of cefepime and metronidazole. Hemoglobin is stable.   Cardiogenic shock 2/2 bradycardia while on coreg/hyperkalemic to 6.2, now resolved  Sinus bradycardia: Resolved with transcutaneous pacing and discontinuation of beta blockers.  Episode of atrial fibrillation/RVR/NSVT: The patent has been converted  from IV to p.o. amnio per cardiology  AKI -improving, likely ATN in setting of valsartan: Creatinine, electrolytes, and volume status will be monitored. We will avoid nephrotoxins and hypotension. Delene Loll has been discontinued.   Lactic acidosis: Resolved.   Acute hypercarbic respiratory failure: Resolved with Narcan x 1. Likely opiate-induced.  Toxic-metabolic encephalopathy: Pt is still confused today. Multifactorial in the setting of acute illness, medications, and hearing impairment. Hold opiates as able; would be cautious with dosing if necessary.  I have seen and examined this patient myself. I have spent 32 minutes in her evaluation and care.  Norberto Wishon, DO [facility] Direct contact: see [email or web address]  7PM-7AM contact night coverage as above 12/12/2018, 2:56 PM  LOS: 4 days

## 2018-12-12 NOTE — Progress Notes (Signed)
Patient's son in attendance.  Patient very confused and is hallucinating, stating she sees "a little dog out on the roof".  Also seeing people walking by her window who aren't there.  Lighting in room is excellent.  Family relate that she has had episodes of confusion when hospitalized before and feel that she will probably fair better at home.  They also were convinced that she required an extra day in the hospital for her safety.  MD is aware.  Patient is able to ambulate with one assist and FWRW to the BR / BSC without difficulty.  Gait appears at baseline, according to the son.  She is at home with daughter, and has assistance when required.

## 2018-12-12 NOTE — Progress Notes (Signed)
Subjective:  Patient denies any chest pain but complains of chest congestion denies any fever or chills.  Remains in sinus rhythm  Objective:  Vital Signs in the last 24 hours: Temp:  [97.6 F (36.4 C)-98.3 F (36.8 C)] 98 F (36.7 C) (12/05 1115) Pulse Rate:  [53-85] 85 (12/05 1115) Resp:  [17-36] 23 (12/05 1115) BP: (113-155)/(58-94) 150/94 (12/05 1115) SpO2:  [92 %-100 %] 92 % (12/05 1115) Weight:  [62.1 kg-62.5 kg] 62.5 kg (12/05 0409)  Intake/Output from previous day: 12/04 0701 - 12/05 0700 In: 570.2 [P.O.:360; I.V.:210.2] Out: 1700 [Urine:1700] Intake/Output from this shift: Total I/O In: 240 [P.O.:240] Out: -   Physical Exam: Neck: no adenopathy, no carotid bruit, no JVD and supple, symmetrical, trachea midline Lungs: Decreased breath sound at bases with faint rales Heart: regular rate and rhythm, S1, S2 normal and 2/6 systolic murmur noted Abdomen: soft, non-tender; bowel sounds normal; no masses,  no organomegaly Extremities: extremities normal, atraumatic, no cyanosis or edema  Lab Results: Recent Labs    12/11/18 0323 12/12/18 0418  WBC 8.1 9.0  HGB 12.7 12.1  PLT 641* 595*   Recent Labs    12/11/18 0323 12/12/18 0418  NA 134* 139  K 3.7 3.9  CL 99 101  CO2 27 30  GLUCOSE 133* 118*  BUN 21 13  CREATININE 1.22* 0.97   No results for input(s): TROPONINI in the last 72 hours.  Invalid input(s): CK, MB Hepatic Function Panel No results for input(s): PROT, ALBUMIN, AST, ALT, ALKPHOS, BILITOT, BILIDIR, IBILI in the last 72 hours. No results for input(s): CHOL in the last 72 hours. No results for input(s): PROTIME in the last 72 hours.  Imaging: Imaging results have been reviewed and Dg Ugi W Single Cm (sol Or Thin Ba)  Result Date: 12/11/2018 CLINICAL DATA:  Abdominal pain. Question of extraluminal air superior to the distal stomach on prior CT. Question perforated ulcer. EXAM: UPPER GI SERIES WITH KUB TECHNIQUE: After obtaining a scout radiograph  a routine upper GI series was performed using water-soluble contrast (150 cc Omnipaque 300). FLUOROSCOPY TIME:  Fluoroscopy Time: 1 minutes and 0 seconds of low-dose pulsed fluoroscopy. Radiation Exposure Index (if provided by the fluoroscopic device): 18.9 mGy Number of Acquired Spot Images: 1 scout image. 3 spot images. COMPARISON:  CT 12/08/2018 and 03/05/2018. FINDINGS: The scout image demonstrates mild gastric distention. The bowel gas pattern is nonobstructive. There is no evidence of free intraperitoneal air. Postsurgical changes are present in the right abdomen related to previous right hemicolectomy. There are degenerative changes throughout the lumbar spine associated with a convex right scoliosis. The study is mildly limited by the patient's limited mobility. Examination was performed in the supine, semi erect and right lateral decubitus positions. The patient swallowed the contrast without difficulty. The esophageal motility appears normal. There is no evidence of esophageal stricture, mass or ulceration. The stomach and duodenum appear normal without evidence of ulceration. No evidence of contrast leak. IMPRESSION: 1. No evidence of peptic ulcer disease or perforation. No contrast leak. 2. Esophageal motility within normal limits. Electronically Signed   By: Richardean Sale M.D.   On: 12/11/2018 14:01    Cardiac Studies:  Assessment/Plan:  Status post symptomatic bradycardia secondary to meds. Acute on chronic decompensated systolic congestive heart failure. Valvular heart disease. Status post paroxysmal A. Fib with RVR/nonsustained.  Few beats of V. tach Status post acute upper GI bleed complicated by probably perforated duodenal ulcer secondary to NSAIDs use.  Negative barium studies  for perforated ulcer as above Status post acute renal injury precipitated by hypotension/Entresto Status post metabolic acidosis. Hypertension. Diabetes mellitus. Hyperlipidemia. Degenerative joint  disease. Plan Increase BiDil as per orders Restart Lasix as per orders Monitor renal function Restart Entresto low-dose if renal function remains stable  LOS: 4 days    Charolette Forward 12/12/2018, 11:20 AM

## 2018-12-12 NOTE — Progress Notes (Signed)
NURSING PROGRESS NOTE  Mariah Aguilar [id number] Transfer Data: 12/12/2018 2:30 AM Attending Provider: Bennie Pierini, MD [id number], Bailey Mech, MD Code Status: Partial Code ( Meds on;y)  Mariah Aguilar is a 85 y.o. female patient transferred from 2H07 -No acute distress noted.  -No complaints of shortness of breath.  -No complaints of chest pain.   Cardiac Monitoring: Box # 5 in place. Cardiac monitor yields:normal sinus rhythm.  Last Documented Vital Signs: Blood pressure 130/68, pulse 73, temperature 98.2 F (36.8 C), temperature source Oral, resp. rate 18, weight 65.4 kg, SpO2 98 %.  IV Fluids:  IV in place, occlusive dsg intact without redness, IV cath external jugular left, condition patent and no redness none.   Allergies:  Shrimp [shellfish allergy] and Penicillins  Past Medical History:   has a past medical history of Arthritis, Cardiomyopathy, Cecal neoplasm, CHF (congestive heart failure) ([place]), Colon polyps, Diverticulosis, Hyperlipidemia, Hypertension, Insomnia, Internal hemorrhoids, and Left bundle branch block (LBBB) on electrocardiogram.  Past Surgical History:   has a past surgical history that includes Abdominal hysterectomy; Colon surgery; Colonoscopy (05/27/2011); and Colonoscopy with propofol (N/A, 06/30/2014).  Social History:   reports that she has never smoked. She has never used smokeless tobacco. She reports that she does not drink alcohol or use drugs.  Skin: Patient has two dime size stage 2 pressure ulcers just below her left breast. No drainage or foul odor noted.  Patient/Family orientated to room. Information packet given to patient/family. Admission inpatient armband information verified with patient/family to include name and date of birth and placed on patient arm. Side rails up x 2, fall assessment and education completed with patient/family. Patient/family able to verbalize understanding of risk associated with falls and verbalized  understanding to call for assistance before getting out of bed. Call light within reach. Patient/family able to voice and demonstrate understanding of unit orientation instructions.    Will continue to evaluate and treat per MD orders.

## 2018-12-12 NOTE — Care Management (Addendum)
12:45 Spoke w patient at bedside who was alert and oriented except for to place. Spoke w her granddaughter over the phone to discuss [facility] providers. We reviewed Medicare ratings.  At this time it is unclear if patient will DC today, order written but patient stated she requested to stay one more night, I could not find supportive documentation for this and sent secure chat to MD to clarify, awaiting to hear back.  Granddaughter deciding on [facility] provider will have choice for [place] when we call back to let her know if patient will DC today. She states they can support the patient at home but staying one more night would optimal for them.  Banks,[name] Granddaughter [phone]  [phone]  15:00 No response from Dr Benny Lennert. Paged and call returned. Patient will not DC today, CM requested she take out DC order. CM updated granddaughter.

## 2018-12-12 NOTE — Progress Notes (Signed)
PT Cancellation Note  Patient Details Name: Mariah Aguilar MRN: [id number] DOB: 09-03-1922   Cancelled Treatment:    Reason Eval/Treat Not Completed: PT screened, no needs identified, will sign off. PT acknowledges re-consult. Pt evaluated yesterday and at baseline level, ambulating household and limited community distances with use of assistive device or HHA from family. Per OT note today pt remains at this functional level. PT will screen pt at this time as she remains at her functional baseline and has no skilled PT needs. PT continues to recommend that pt ambulates multiple times per day during hospitalization with assistance of nursing staff or family. Acute PT signing off.   Zenaida Niece 12/12/2018, 5:13 PM

## 2018-12-12 NOTE — Evaluation (Signed)
Occupational Therapy Evaluation Patient Details Name: Mariah Aguilar MRN: [id number] DOB: 1922-02-15 Today's Date: 12/12/2018    History of Present Illness 85 y.o. female with HFrEF (EF 20%), DM2, known diverticular disease, GERD, HLD and HLD presenting with bradycardia (probable complete heart block although no EKG obtained due to presence of transcutaneous pacing on arrival) and abdominal pain with likely perforated duodenal ulcer leading with lactic acidosis, AKI and hyperkalemia 6.2. Pt requesting for limited interventions and to return home.   Clinical Impression   Pt PTA:  Living with family and mostly independent with ADL and mobility - mostly furniture walking. Pt performing ADL functional transfers and ADL functional mobility with minguardA; pt minA for bed mobility. Pt performing own toilet hygiene at commode; pt stood at sink briefly for washing hands. Pt set-upA/supervisionA for both tasks.Pt minA overall for ADL. Pt set-upA to dial her sister's number that she had memorized. Pt's SPO2>87% on RA with exertion ~75' with RW, but unable to get a good O2 reading throughout mobility. Pt quickly recovers x1 min of pursed lip breathing. Pt does not require continued OT skilled services. OT signing off.      Follow Up Recommendations  No OT follow up;Supervision/Assistance - 24 hour    Equipment Recommendations  None recommended by OT    Recommendations for Other Services       Precautions / Restrictions Precautions Precautions: Fall Restrictions Weight Bearing Restrictions: No      Mobility Bed Mobility Overal bed mobility: Modified Independent Bed Mobility: Supine to Sit;Sit to Supine     Supine to sit: Min assist Sit to supine: Min assist   General bed mobility comments: min assist to support trunk and min to assist legs to bed  Transfers Overall transfer level: Needs assistance Equipment used: Rolling walker (2 wheeled) Transfers: Sit to/from Stand Sit to  Stand: Supervision         General transfer comment: pt requires minG for sit to stand with HHA    Balance Overall balance assessment: Needs assistance Sitting-balance support: No upper extremity supported;Feet supported Sitting balance-Leahy Scale: Good     Standing balance support: Bilateral upper extremity supported Standing balance-Leahy Scale: Fair                             ADL either performed or assessed with clinical judgement   ADL Overall ADL's : At baseline                                       General ADL Comments: Pt performing own toilet hygiene at commode; pt stood at sink briefly for washing hands. Pt set-upA/supervisionA for both tasks. Pt set-upA to dial her sister's number that she had memorized.     Vision Baseline Vision/History: No visual deficits Vision Assessment?: No apparent visual deficits     Perception     Praxis      Pertinent Vitals/Pain Pain Assessment: No/denies pain     Hand Dominance Right   Extremity/Trunk Assessment Upper Extremity Assessment Upper Extremity Assessment: Overall WFL for tasks assessed   Lower Extremity Assessment Lower Extremity Assessment: Overall WFL for tasks assessed   Cervical / Trunk Assessment Cervical / Trunk Assessment: Kyphotic   Communication Communication Communication: No difficulties   Cognition Arousal/Alertness: Awake/alert Behavior During Therapy: WFL for tasks assessed/performed Overall Cognitive Status: Within Functional Limits for tasks  assessed Area of Impairment: Attention;Memory;Safety/judgement                   Current Attention Level: Selective Memory: Decreased short-term memory   Safety/Judgement: Decreased awareness of safety     General Comments: Pt hallucinating- pointing to people who were not in the room; RN aware.   General Comments  >86% on RA with exertion; quickly recovers x1 min of pursed lip breathing    Exercises      Shoulder Instructions      Home Living Family/patient expects to be discharged to:: Private residence Living Arrangements: Children Available Help at Discharge: Family Type of Home: House Home Access: Level entry     Home Layout: One level     Bathroom Shower/Tub: Teacher, early years/pre: Standard     Home Equipment: Environmental consultant - 2 wheels;Cane - single point          Prior Functioning/Environment Level of Independence: Needs assistance  Gait / Transfers Assistance Needed: pt ambulates household distances independently with support of furniture. Pt utilizes HHA of family Occupational psychologist for community mobility ADL's / Homemaking Assistance Needed: Pt mostly independent with ADL; family present for bathing; IADL provided for pt            OT Problem List: Decreased activity tolerance      OT Treatment/Interventions:      OT Goals(Current goals can be found in the care plan section) Acute Rehab OT Goals Patient Stated Goal: to go home  OT Frequency:     Barriers to D/C:            Co-evaluation              AM-PAC OT "6 Clicks" Daily Activity     Outcome Measure Help from another person eating meals?: None Help from another person taking care of personal grooming?: A Little Help from another person toileting, which includes using toliet, bedpan, or urinal?: A Little Help from another person bathing (including washing, rinsing, drying)?: A Little Help from another person to put on and taking off regular upper body clothing?: A Little Help from another person to put on and taking off regular lower body clothing?: A Little 6 Click Score: 19   End of Session Equipment Utilized During Treatment: Rolling walker Nurse Communication: Mobility status  Activity Tolerance: Patient tolerated treatment well Patient left: in bed;with call bell/phone within reach;with bed alarm set  OT Visit Diagnosis: Unsteadiness on feet (R26.81)                Time:  1251-1315 OT Time Calculation (min): 24 min Charges:  OT General Charges $OT Visit: 1 Visit OT Evaluation $OT Eval Moderate Complexity: 1 Mod OT Treatments $Self Care/Home Management : 8-22 mins  Ebony Hail Harold Hedge) Marsa Aris OTR/L Acute Rehabilitation Services Pager: [phone] Office: [place] 12/12/2018, 2:36 PM

## 2018-12-13 DIAGNOSIS — I5023 Acute on chronic systolic (congestive) heart failure: Secondary | ICD-10-CM

## 2018-12-13 DIAGNOSIS — K262 Acute duodenal ulcer with both hemorrhage and perforation: Principal | ICD-10-CM

## 2018-12-13 DIAGNOSIS — J9601 Acute respiratory failure with hypoxia: Secondary | ICD-10-CM

## 2018-12-13 DIAGNOSIS — E118 Type 2 diabetes mellitus with unspecified complications: Secondary | ICD-10-CM

## 2018-12-13 LAB — CBC
HCT: 37.6 % (ref 36.0–46.0)
Hemoglobin: 12.1 g/dL (ref 12.0–15.0)
MCH: 27.4 pg (ref 26.0–34.0)
MCHC: 32.2 g/dL (ref 30.0–36.0)
MCV: 85.1 fL (ref 80.0–100.0)
Platelets: 577 10*3/uL — ABNORMAL HIGH (ref 150–400)
RBC: 4.42 MIL/uL (ref 3.87–5.11)
RDW: 18.2 % — ABNORMAL HIGH (ref 11.5–15.5)
WBC: 9.2 10*3/uL (ref 4.0–10.5)
nRBC: 0 % (ref 0.0–0.2)

## 2018-12-13 LAB — CULTURE, BLOOD (ROUTINE X 2)
Culture: NO GROWTH
Culture: NO GROWTH

## 2018-12-13 LAB — BASIC METABOLIC PANEL
Anion gap: 7 (ref 5–15)
BUN: 11 mg/dL (ref 8–23)
CO2: 31 mmol/L (ref 22–32)
Calcium: 8 mg/dL — ABNORMAL LOW (ref 8.9–10.3)
Chloride: 100 mmol/L (ref 98–111)
Creatinine, Ser: 0.89 mg/dL (ref 0.44–1.00)
GFR calc Af Amer: >60 mL/min (ref >60)
GFR calc non Af Amer: 55 mL/min — ABNORMAL LOW (ref >60)
Glucose, Bld: 115 mg/dL — ABNORMAL HIGH (ref 70–99)
Potassium: 3.9 mmol/L (ref 3.5–5.1)
Sodium: 138 mmol/L (ref 135–145)

## 2018-12-13 MED ORDER — ENTRESTO 49-51 MG PO TABS: 1.0000 | ORAL_TABLET | Freq: Two times a day (BID) | ORAL | 0 refills | Status: AC

## 2018-12-13 NOTE — TOC Transition Note (Signed)
Transition of Care [facility]) - CM/SW Discharge Note   Patient Details  Name: MARILIN GALLETTI MRN: [id number] Date of Birth: [birth year]  [facility] CM/SW Contact:  Carles Collet, RN Phone Number: 12/13/2018, 1:34 PM   Clinical Narrative:    Damaris Schooner w granddaughter [name] over the phone. She would like [facility] for [facility] services. Referral accepted SOC 48 hours. [name] to provide transportation home    Final next level of care: [place] Barriers to Discharge: No Barriers Identified   Patient Goals and CMS Choice Patient states their goals for this hospitalization and ongoing recovery are:: to go home CMS Medicare.gov Compare Post Acute Care list provided to:: Other (Comment Required) Choice offered to / list presented to : Adult Children  Discharge Placement                       Discharge Plan and Services                          HH Arranged: RN [place] Agency: [place] Date [facility] Contacted: 12/13/18 Time [place]: [id number] Representative spoke with at [place]: [place] ([place]) Interventions     Readmission Risk Interventions No flowsheet data found.

## 2018-12-13 NOTE — Progress Notes (Signed)
Subjective:  Patient denies any chest pain or shortness of breath.  States overall feels better is being discharged today. Renal function remains stable Objective:  Vital Signs in the last 24 hours: Temp:  [98.4 F (36.9 C)-98.9 F (37.2 C)] 98.8 F (37.1 C) (12/06 1147) Pulse Rate:  [84-94] 84 (12/06 1147) Resp:  [18-19] 19 (12/06 1147) BP: (125-145)/(73-100) 125/73 (12/06 1147) SpO2:  [90 %-100 %] 100 % (12/06 1147) Weight:  [61.2 kg] 61.2 kg (12/06 0659)  Intake/Output from previous day: 12/05 0701 - 12/06 0700 In: 1040 [P.O.:1040] Out: -  Intake/Output from this shift: Total I/O In: 240 [P.O.:240] Out: -   Physical Exam: Exam unchanged  Lab Results: Recent Labs    12/12/18 0418 12/13/18 0512  WBC 9.0 9.2  HGB 12.1 12.1  PLT 595* 577*   Recent Labs    12/12/18 0418 12/13/18 0512  NA 139 138  K 3.9 3.9  CL 101 100  CO2 30 31  GLUCOSE 118* 115*  BUN 13 11  CREATININE 0.97 0.89   No results for input(s): TROPONINI in the last 72 hours.  Invalid input(s): CK, MB Hepatic Function Panel No results for input(s): PROT, ALBUMIN, AST, ALT, ALKPHOS, BILITOT, BILIDIR, IBILI in the last 72 hours. No results for input(s): CHOL in the last 72 hours. No results for input(s): PROTIME in the last 72 hours.  Imaging: Imaging results have been reviewed and Dg Ugi W Single Cm (sol Or Thin Ba)  Result Date: 12/11/2018 CLINICAL DATA:  Abdominal pain. Question of extraluminal air superior to the distal stomach on prior CT. Question perforated ulcer. EXAM: UPPER GI SERIES WITH KUB TECHNIQUE: After obtaining a scout radiograph a routine upper GI series was performed using water-soluble contrast (150 cc Omnipaque 300). FLUOROSCOPY TIME:  Fluoroscopy Time: 1 minutes and 0 seconds of low-dose pulsed fluoroscopy. Radiation Exposure Index (if provided by the fluoroscopic device): 18.9 mGy Number of Acquired Spot Images: 1 scout image. 3 spot images. COMPARISON:  CT 12/08/2018 and  03/05/2018. FINDINGS: The scout image demonstrates mild gastric distention. The bowel gas pattern is nonobstructive. There is no evidence of free intraperitoneal air. Postsurgical changes are present in the right abdomen related to previous right hemicolectomy. There are degenerative changes throughout the lumbar spine associated with a convex right scoliosis. The study is mildly limited by the patient's limited mobility. Examination was performed in the supine, semi erect and right lateral decubitus positions. The patient swallowed the contrast without difficulty. The esophageal motility appears normal. There is no evidence of esophageal stricture, mass or ulceration. The stomach and duodenum appear normal without evidence of ulceration. No evidence of contrast leak. IMPRESSION: 1. No evidence of peptic ulcer disease or perforation. No contrast leak. 2. Esophageal motility within normal limits. Electronically Signed   By: Richardean Sale M.D.   On: 12/11/2018 14:01    Cardiac Studies:  Assessment/Plan:  Status post symptomatic bradycardia secondary to meds. Acute on chronic decompensated systolic congestive heart failure. Valvular heart disease. Status post paroxysmal A. Fib with RVR/nonsustained. Few beats of V. tach Status post acute upper GI bleed complicated by probably perforated duodenal ulcer secondary to NSAIDs use.  Negative barium studies for perforated ulcer as above Status post acute renal injury precipitated by hypotension/Entresto Status post metabolic acidosis. Hypertension. Diabetes mellitus. Hyperlipidemia. Degenerative joint disease. Plan Restart Entresto 24/26 mg twice daily I will sign off please call if needed  LOS: 5 days    Charolette Forward 12/13/2018, 11:55 AM

## 2018-12-13 NOTE — Discharge Summary (Signed)
Physician Discharge Summary  Mariah Aguilar [id number] DOB: [birth year] DOA: 12/08/2018  PCP: Glendale Chard, MD  Admit date: 12/08/2018 Discharge date: 12/13/2018  Recommendations for Outpatient Follow-up:  1. Follow up with PCP in 7-10 days. 2. Chemistry to be drawn on 12/16/2018 and results reported to PCP. 3. Follow up with Dr. Terrence Dupont in 1 month.  Discharge Diagnoses: Principal diagnosis is #1 1. Probable perforated duodenal ulcer with upper GI Bleed. Non-operative. 2. Cardiogenic shock due to bradycardia while on coreg. 3. Sinus bradycardia: Resolved with transcutaneous pacing and discontinuation of beta blockers. 4. Atrial fibrillation with RVR and NSVT 5. Lactic Acidosis 6. Acute hypercarbic respiratory failure 7. Toxic metabolic encephalopathy - resolved.  Discharge Condition: Fair  Disposition: Home  Diet recommendation: Heart healthy  Filed Weights   12/11/18 2136 12/12/18 0409 12/13/18 0659  Weight: 62.1 kg 62.5 kg 61.2 kg    History of present illness:  Patient is a 85 year old African-American female with past medical history significant for congestive heart failure with EF of 20 to 25%, left bundle branch block, hypertension, hyperlipidemia, chronic thrombocytosis and arthritis.  Patient presents with 2-day history of progressive worsening shortness of breath, dyspnea on exertion with leg edema.  No fever or chills, no cough or phlegm production.  No headache, no neck pain, no chest pain, no URI symptoms or urinary symptoms.  Presentation to the hospital, cardiac BNP was elevated at 1991.  Chest x-ray said to reveal cardiomegaly with vascular congestion, interstitial pulmonary edema and small pleural effusion.  Airspace disease was also reported, left greater than right lung base, concerning for atelectasis versus pneumonia (reviewing prior chest x-ray done on July 28, 2018, bibasilar lung findings may not be new).  COVID-19 test is pending.  Hospital Course:    Patient with h/o HTN; HLD; diverticular disease; and HFrEF (EF 20-25%) who was admitted to [facility] from 11/18-20 for acute respiratory failure associated with CHF exacerbation, diuresed and started on Entresto. She returned on 12/1 with 3 days of abdominal pain. She became unresponsive with EMS with HR 22, paced at a rate of 60 with improved mental status. She was thought to have a perforated duodenal ulcer with lactate acidosis, AKI, and hyperkalemia 6.2. Conservative management, not a surgical candidate. UGI negative, eating now. Bradycardia resolved with insulin and stopping BB. Likely needs ongoing monitoring for a few days, antibiotics. Limited code - pressors ok, no CPR. Has improved significantly, likely will be ready for d/c in a couple of days.   She has been transferred to [facility] hospital[facility] care and telemetry on 12/12/2018.  Today's assessment: S: The patient is sleeping soundly, but easily rouseable. No new complaints. O: Vitals:  Vitals:   12/13/18 1049 12/13/18 1147  BP: 135/86 125/73  Pulse: 84 84  Resp:  19  Temp: 98.9 F (37.2 C) 98.8 F (37.1 C)  SpO2: 100% 100%   Exam:  Constitutional:   The patient is awake, but somnolent. No acute distress. Eyes:   pupils and irises appear normal  Normal lids and conjunctivae Respiratory:   No increased work of breathing.  No wheezes, rales, or rhonchi  No tactile fremitus Cardiovascular:   Regular rate and rhythm  No murmurs, ectopy, or gallups.  No lateral PMI. No thrills. Abdomen:   Abdomen is soft, non-tender, non-distended  No hernias, masses, or organomegaly  Normoactive bowel sounds.  Musculoskeletal:   No cyanosis, clubbing, or edema Skin:   No rashes, lesions, ulcers  palpation of skin: no induration or nodules Neurologic:  CN 2-12 intact  Sensation all 4 extremities intact Psychiatric:   Mental status o Mood, affect appropriate o Orientation to person, place, time   judgment and  insight appear intact  Discharge Instructions  Discharge Instructions    Activity as tolerated - No restrictions   Complete by: As directed    Call MD for:  difficulty breathing, headache or visual disturbances   Complete by: As directed    Call MD for:  temperature >100.4   Complete by: As directed    Diet - low sodium heart healthy   Complete by: As directed    Discharge instructions   Complete by: As directed    Follow up with PCP in 7-10 days. Chemistry to be drawn on 12/16/2018 and results reported to PCP. Follow up with Dr. Terrence Dupont in 1 month.   Face-to-face encounter (required for Medicare/Medicaid patients)   Complete by: As directed    I Rylann Munford certify that this patient is under my care and that I, or a nurse practitioner or physician's assistant working with me, had a face-to-face encounter that meets the physician face-to-face encounter requirements with this patient on 12/12/2018. The encounter with the patient was in whole, or in part for the following medical condition(s) which is the primary reason for home health care (List medical condition): Bradycardia, acute respiratory failure, s/p perforated duodenal ulcer.   The encounter with the patient was in whole, or in part, for the following medical condition, which is the primary reason for home health care: Bradycardia, acute respiratory failure, s/p perforated duodenal ulcer.   I certify that, based on my findings, the following services are medically necessary home health services: Nursing   Reason for Medically Necessary Home Health Services: Skilled Nursing- Skilled Assessment/Observation   My clinical findings support the need for the above services: Unable to leave home safely without assistance and/or assistive device   Further, I certify that my clinical findings support that this patient is homebound due to: Unable to leave home safely without assistance   Home Health   Complete by: As directed    To provide the  following care/treatments: RN   Increase activity slowly   Complete by: As directed      Allergies as of 12/13/2018      Reactions   Shrimp [shellfish Allergy] Itching, Swelling   Penicillins Hives, Rash   Did it involve swelling of the face/tongue/throat, SOB, or low BP? Unknown Did it involve sudden or severe rash/hives, skin peeling, or any reaction on the inside of your mouth or nose? Unknown Did you need to seek medical attention at a hospital or doctor's office? Unknown When did it last happen? If all above answers are "NO", may proceed with cephalosporin use.      Medication List    STOP taking these medications   carvedilol 12.5 MG tablet Commonly known as: COREG   diazepam 5 MG tablet Commonly known as: VALIUM   guaiFENesin 600 MG 12 hr tablet Commonly known as: MUCINEX   ibuprofen 600 MG tablet Commonly known as: ADVIL   rosuvastatin 10 MG tablet Commonly known as: CRESTOR   sacubitril-valsartan 97-103 MG Commonly known as: ENTRESTO   traMADol 50 MG tablet Commonly known as: ULTRAM     TAKE these medications   AeroChamber Plus inhaler Use as instructed   amiodarone 100 MG tablet Commonly known as: PACERONE Take 1 tablet (100 mg total) by mouth daily.   cefdinir 300 MG capsule Commonly known as: OMNICEF  Take 1 capsule (300 mg total) by mouth every 12 (twelve) hours for 5 days.   Dexilant 60 MG capsule Generic drug: dexlansoprazole TAKE 1 CAPSULE BY MOUTH EVERY DAY What changed: how much to take   fluticasone furoate-vilanterol 200-25 MCG/INH Aepb Commonly known as: BREO ELLIPTA Inhale 1 puff into the lungs daily.   furosemide 40 MG tablet Commonly known as: LASIX Take 0.5-1 tablets (20-40 mg total) by mouth 2 (two) times daily. Take 40mg  in the morning and 20mg  at lunch. What changed:   how much to take  additional instructions   isosorbide-hydrALAZINE 20-37.5 MG tablet Commonly known as: BIDIL Take 0.5 tablets by mouth 3 (three)  times daily.   NIFEREX-150 FORTE PO Take 150 mg by mouth daily.   potassium chloride 8 MEQ CR tablet Commonly known as: KLOR-CON Take 8 mEq by mouth daily.   Ventolin HFA 108 (90 Base) MCG/ACT inhaler Generic drug: albuterol TAKE 2 PUFFS BY MOUTH EVERY 6 HOURS AS NEEDED FOR WHEEZE OR SHORTNESS OF BREATH What changed: See the new instructions.      Allergies  Allergen Reactions   Shrimp [Shellfish Allergy] Itching and Swelling   Penicillins Hives and Rash    Did it involve swelling of the face/tongue/throat, SOB, or low BP? Unknown Did it involve sudden or severe rash/hives, skin peeling, or any reaction on the inside of your mouth or nose? Unknown Did you need to seek medical attention at a hospital or doctor's office? Unknown When did it last happen? If all above answers are "NO", may proceed with cephalosporin use.    The results of significant diagnostics from this hospitalization (including imaging, microbiology, ancillary and laboratory) are listed below for reference.    Significant Diagnostic Studies: Ct Abdomen Pelvis Wo Contrast  Result Date: 12/08/2018 CLINICAL DATA:  85 year old female with abdominal pain. Concern for gastroenteritis versus colitis. EXAM: CT ABDOMEN AND PELVIS WITHOUT CONTRAST TECHNIQUE: Multidetector CT imaging of the abdomen and pelvis was performed following the standard protocol without IV contrast. COMPARISON:  CT of the abdomen pelvis dated 03/05/2018. FINDINGS: Evaluation of this exam is limited due to severe respiratory motion artifact as well as in the absence of intravenous contrast. Evaluation is also limited due to streak artifact caused by patient's arms. Lower chest: Partially visualized small right and trace left pleural effusions. This is new or increased since the prior CT. Bibasilar airspace densities, right greater left concerning for developing infiltrate. Clinical correlation is recommended. There is cardiomegaly. Coronary  vascular calcifications noted. No intra-abdominal free air. Small ascites. Hepatobiliary: The liver is grossly unremarkable. High attenuating content within the gallbladder represents noncalcified stone or sludge. Ultrasound may provide better evaluation of the gallbladder. Pancreas: The pancreas is grossly unremarkable as visualized. Spleen: Normal in size without focal abnormality. Adrenals/Urinary Tract: The adrenal glands are unremarkable. There is no hydronephrosis or nephrolithiasis on either side. The urinary bladder is decompressed around a Foley catheter. Stomach/Bowel: There is sigmoid diverticulosis without active inflammatory changes. The stomach is distended with air and ingested content. The cecum appears to be located in the right upper abdomen. Linear high attenuation in the region of the cecum likely surgical suture. Small clusters of air superior to the gastric antrum (coronal series 6 images 32-44 and axial series 3 images 39-42) appear to be within a tubular structure which measures approximately 11 mm in diameter. This may represent an abnormal appendix. However, I am not able to identified and appendix on the prior CT and therefore findings may represent  extraluminal air related to perforation of peptic ulcer. Evaluation however is very limited due to factors above. Clinical correlation is recommended. Repeat CT with oral contrast may provide better evaluation. There is no bowel obstruction. Vascular/Lymphatic: Advanced aortoiliac atherosclerotic disease. The aorta is tortuous. Reproductive: Hysterectomy. A pessary is noted within pelvis. Other: Diffuse subcutaneous edema. Musculoskeletal: Degenerative changes of the spine. Old healed right pubic bone fractures. No acute osseous pathology. IMPRESSION: 1. Small clusters of air superior to the gastric antrum. Findings may represent an abnormal appendix or extraluminal air related to perforated peptic ulcer. Evaluation is very limited due to  respiratory motion artifact. Correlation with clinical exam and history of prior appendectomy recommended. Repeat CT with oral contrast may provide better evaluation. 2. Probable gallbladder sludge or stone. 3. Sigmoid diverticulosis. No bowel obstruction. 4. Small right and trace left pleural effusions with bibasilar airspace densities concerning for developing infiltrate. Clinical correlation is recommended. 5. Aortic Atherosclerosis (ICD10-I70.0). These results were called by telephone at the time of interpretation on 12/08/2018 at 7:29 pm to provider RACHEL LITTLE , who verbally acknowledged these results. Electronically Signed   By: Anner Crete M.D.   On: 12/08/2018 19:38   Ct Head Wo Contrast  Result Date: 12/08/2018 CLINICAL DATA:  Altered level of consciousness EXAM: CT HEAD WITHOUT CONTRAST TECHNIQUE: Contiguous axial images were obtained from the base of the skull through the vertex without intravenous contrast. COMPARISON:  None. FINDINGS: Brain: There is atrophy and chronic small vessel disease changes. No acute intracranial abnormality. Specifically, no hemorrhage, hydrocephalus, mass lesion, acute infarction, or significant intracranial injury. Vascular: No hyperdense vessel or unexpected calcification. Skull: No acute calvarial abnormality. Sinuses/Orbits: Visualized paranasal sinuses and mastoids clear. Orbital soft tissues unremarkable. Other: None IMPRESSION: Atrophy, chronic microvascular disease. No acute intracranial abnormality. Electronically Signed   By: Rolm Baptise M.D.   On: 12/08/2018 19:17   Dg Chest Port 1 View  Result Date: 12/09/2018 CLINICAL DATA:  Central line placement EXAM: PORTABLE CHEST 1 VIEW COMPARISON:  12/08/2018 FINDINGS: Right central line is been place which crosses into the left innominate vein. No pneumothorax. Cardiomegaly with vascular congestion. Bibasilar atelectasis with small effusions. No acute bony abnormality. IMPRESSION: Right central line courses  across the midline into the left innominate vein. No pneumothorax. Cardiomegaly, vascular congestion. Small effusions with bibasilar atelectasis. Electronically Signed   By: Rolm Baptise M.D.   On: 12/09/2018 01:21   Dg Chest Portable 1 View  Result Date: 12/08/2018 CLINICAL DATA:  Bradycardia. Respiratory distress. Recent hospitalization. EXAM: PORTABLE CHEST 1 VIEW COMPARISON:  Radiograph 11/24/2018. FINDINGS: Chronic cardiomegaly that is unchanged. Aortic tortuosity and atherosclerosis, also unchanged. Central pulmonary edema slightly worsened from prior exam. There are bilateral pleural effusions and bibasilar opacities. No pneumothorax. No acute osseous abnormalities are seen. IMPRESSION: Slight worsening of pulmonary edema. Cardiomegaly and bilateral pleural effusions are similar to recent exam. Bibasilar opacity, favoring compressive atelectasis. Electronically Signed   By: Keith Rake M.D.   On: 12/08/2018 18:13   Dg Chest Portable 1 View  Result Date: 11/24/2018 CLINICAL DATA:  Shortness of breath EXAM: PORTABLE CHEST 1 VIEW COMPARISON:  07/28/2018, CT 10/16/2015 FINDINGS: Small bilateral pleural effusions. Marked cardiomegaly with vascular congestion and diffuse interstitial edema. Patchy atelectasis or pneumonia at the bases. Aortic atherosclerosis. No pneumothorax. IMPRESSION: 1. Cardiomegaly with vascular congestion, interstitial pulmonary edema and small pleural effusions 2. Airspace disease at the left greater than right lung base, atelectasis versus pneumonia. Electronically Signed   By: Madie Reno.D.  On: 11/24/2018 16:28   Dg Abd Portable 1v  Result Date: 11/24/2018 CLINICAL DATA:  Abdominal distension. Epigastric pain. EXAM: PORTABLE ABDOMEN - 1 VIEW COMPARISON:  CT 03/05/2018 FINDINGS: Gaseous distention of colonic segment in the central lower abdomen/pelvis, up to 10 cm. Air throughout the remainder of normal caliber colon. No evidence of small bowel obstruction or free  air. Pessary in place. Cardiomegaly with interstitial coarsening at the lung bases. No acute osseous abnormalities are seen. Scoliosis and degenerative change in the spine. IMPRESSION: 1. Gaseous distention of colonic segment in the central lower abdomen/pelvis possibly sigmoid colon based on prior CT. Recommend CT for further evaluation. 2. Cardiomegaly. Electronically Signed   By: Keith Rake M.D.   On: 11/24/2018 23:47   Dg Duanne Limerick W Single Cm (sol Or Thin Ba)  Result Date: 12/11/2018 CLINICAL DATA:  Abdominal pain. Question of extraluminal air superior to the distal stomach on prior CT. Question perforated ulcer. EXAM: UPPER GI SERIES WITH KUB TECHNIQUE: After obtaining a scout radiograph a routine upper GI series was performed using water-soluble contrast (150 cc Omnipaque 300). FLUOROSCOPY TIME:  Fluoroscopy Time: 1 minutes and 0 seconds of low-dose pulsed fluoroscopy. Radiation Exposure Index (if provided by the fluoroscopic device): 18.9 mGy Number of Acquired Spot Images: 1 scout image. 3 spot images. COMPARISON:  CT 12/08/2018 and 03/05/2018. FINDINGS: The scout image demonstrates mild gastric distention. The bowel gas pattern is nonobstructive. There is no evidence of free intraperitoneal air. Postsurgical changes are present in the right abdomen related to previous right hemicolectomy. There are degenerative changes throughout the lumbar spine associated with a convex right scoliosis. The study is mildly limited by the patient's limited mobility. Examination was performed in the supine, semi erect and right lateral decubitus positions. The patient swallowed the contrast without difficulty. The esophageal motility appears normal. There is no evidence of esophageal stricture, mass or ulceration. The stomach and duodenum appear normal without evidence of ulceration. No evidence of contrast leak. IMPRESSION: 1. No evidence of peptic ulcer disease or perforation. No contrast leak. 2. Esophageal motility  within normal limits. Electronically Signed   By: Richardean Sale M.D.   On: 12/11/2018 14:01    Microbiology: Recent Results (from the past 240 hour(s))  Blood culture (routine x 2)     Status: None   Collection Time: 12/08/18  5:55 PM   Specimen: BLOOD  Result Value Ref Range Status   Specimen Description BLOOD SITE NOT SPECIFIED  Final   Special Requests   Final    BOTTLES DRAWN AEROBIC AND ANAEROBIC Blood Culture results may not be optimal due to an inadequate volume of blood received in culture bottles   Culture   Final    NO GROWTH 5 DAYS Performed at [facility], [place] [address] [place], [place] [address]    Report Status 12/13/2018 FINAL  Final  Urine culture     Status: None   Collection Time: 12/08/18  6:13 PM   Specimen: Urine, Random  Result Value Ref Range Status   Specimen Description URINE, RANDOM  Final   Special Requests NONE  Final   Culture   Final    NO GROWTH Performed at [facility], [place] [address], [place], [place] [id number]    Report Status 12/09/2018 FINAL  Final  Blood culture (routine x 2)     Status: None   Collection Time: 12/08/18  8:20 PM   Specimen: BLOOD LEFT WRIST  Result Value Ref Range Status  Specimen Description BLOOD LEFT WRIST  Final   Special Requests   Final    BOTTLES DRAWN AEROBIC ONLY Blood Culture results may not be optimal due to an inadequate volume of blood received in culture bottles   Culture   Final    NO GROWTH 5 DAYS Performed at [facility], [place] [address], [place], [place] [address]    Report Status 12/13/2018 FINAL  Final  MRSA PCR Screening     Status: None   Collection Time: 12/08/18 11:25 PM   Specimen: Nasopharyngeal  Result Value Ref Range Status   MRSA by PCR NEGATIVE NEGATIVE Final    Comment:        The GeneXpert MRSA Assay (FDA approved for NASAL specimens only), is one component of a comprehensive MRSA colonization surveillance program. It is not intended to diagnose  MRSA infection nor to guide or monitor treatment for MRSA infections. Performed at [facility], [place] [address] [place], [place] [address]      Labs: Basic Metabolic Panel: Recent Labs  Lab 12/08/18 2333  12/09/18 0353  12/09/18 1423 12/10/18 0300 12/11/18 0323 12/12/18 0418 12/13/18 0512  NA 136   < > 132*  --  128* 125* 134* 139 138  K 4.4   < > 3.8   < > 3.1* 3.7 3.7 3.9 3.9  CL 94*  --  92*  --  91* 90* 99 101 100  CO2 25  --  27  --  26 27 27 30 31   GLUCOSE 97  --  89  --  90 163* 133* 118* 115*  BUN 63*  --  60*  --  51* 38* 21 13 11   CREATININE 3.41*  --  3.16*  --  2.51* 1.89* 1.22* 0.97 0.89  CALCIUM 8.0*  --  7.7*  --  8.4* 7.2* 7.9* 7.9* 8.0*  MG 3.5*  --  3.1*  --   --  2.4  --   --   --   PHOS 9.4*  --  7.9*  --   --  4.4  --   --   --    < > = values in this interval not displayed.   Liver Function Tests: Recent Labs  Lab 12/08/18 1755  AST 133*  ALT 146*  ALKPHOS 52  BILITOT 0.5  PROT 5.7*  ALBUMIN 2.8*   Recent Labs  Lab 12/08/18 1755  LIPASE 20   No results for input(s): AMMONIA in the last 168 hours. CBC: Recent Labs  Lab 12/08/18 1755  12/10/18 0300 12/10/18 1547 12/11/18 0323 12/12/18 0418 12/13/18 0512  WBC 9.0   < > 8.2 9.6 8.1 9.0 9.2  NEUTROABS 6.1  --   --   --   --   --   --   HGB 12.1   < > 12.1 12.8 12.7 12.1 12.1  HCT 38.3   < > 36.8 38.8 38.4 38.2 37.6  MCV 87.6   < > 83.4 83.1 82.1 84.9 85.1  PLT 729*   < > 611* 671* 641* 595* 577*   < > = values in this interval not displayed.   Cardiac Enzymes: No results for input(s): CKTOTAL, CKMB, CKMBINDEX, TROPONINI in the last 168 hours. BNP: BNP (last 3 results) Recent Labs    11/19/18 1643 11/24/18 1603 12/08/18 2044  BNP 868.8* 1,991.1* 2,231.5*    ProBNP (last 3 results) No results for input(s): PROBNP in the last 8760 hours.  CBG: Recent  Labs  Lab 12/10/18 1115 12/10/18 1608 12/11/18 0732 12/11/18 1123 12/11/18 1614  GLUCAP 169* 157* 116* 160*  121*    Active Problems:   Perforated bowel (HCC)   Time coordinating discharge: 38 minutes.  Signed:        Jamieson Hetland, DO [facility]  12/13/2018, 12:40 PM

## 2018-12-13 NOTE — Progress Notes (Signed)
Spoke with patient's granddaughter Mariah Aguilar via phone at 1319, informed ptaient ok to discharge. Mariah Aguilar will bring clothing and will arrive approx 1230- 1300 for pickup. Patient informed of discharge.  Spoke with De[facility] case mgmt and stated will call granddaughter later to set up home health at 1319  1330 spoke with granddaughter Mariah Aguilar via phone and informed ready.   Paged Swayze at 1328. Pt. granddaughter here for discharge. is pt. going home on O2? daughter stated unclear if she leave with O2. Spoke with Swayze via phone at 1353 and stated need to do walking O2 on room air to ensure patient ok to discharge and to document walking O2 (prior walking O2 not documented).   Paged Swayze 1408. O2 remain 93% walking w/o O2. Granddaughter inquired alternative to [facility];prescript $200 insur not cover  Reviewed AVS with granddaughter Mariah Aguilar at 105. Left unit with NT in wheelchair at 1429

## 2018-12-13 NOTE — Progress Notes (Addendum)
Patient walking O2 with no oxygen; remained at 93% O2 with front wheel walker. No dyspnea observed.

## 2018-12-13 NOTE — Progress Notes (Signed)
Mariah Aguilar to be D/C'd Home per MD order.  Discussed with the patient and all questions fully answered.  VSS, Skin clean, dry and intact without evidence of skin break down, no evidence of skin tears noted.   An After Visit Summary was printed, reviewed and given to the patient and granddaughter [name]. Patient received prescription.  D/c education completed with patient/family including follow up instructions, medication list, d/c activities limitations if indicated, with other d/c instructions as indicated by MD - patient able to verbalize understanding, all questions fully answered.   Patient instructed to return to ED, call 911, or call MD for any changes in condition.   Patient escorted via [place] to main entrance by NT and left via private auto with granddaughter [name].  Howard Pouch 12/13/2018 8:41 PM

## 2018-12-14 ENCOUNTER — Telehealth: Payer: Self-pay

## 2018-12-14 ENCOUNTER — Ambulatory Visit: Payer: Self-pay

## 2018-12-14 DIAGNOSIS — N182 Chronic kidney disease, stage 2 (mild): Secondary | ICD-10-CM

## 2018-12-14 DIAGNOSIS — I130 Hypertensive heart and chronic kidney disease with heart failure and stage 1 through stage 4 chronic kidney disease, or unspecified chronic kidney disease: Secondary | ICD-10-CM

## 2018-12-14 DIAGNOSIS — E1122 Type 2 diabetes mellitus with diabetic chronic kidney disease: Secondary | ICD-10-CM

## 2018-12-14 NOTE — Chronic Care Management (AMB) (Signed)
  Chronic Care Management   Outreach Note  12/14/2018 Name: Mariah Aguilar MRN: [id number] DOB: 1922/06/29  Referred by: Glendale Chard, MD Reason for referral : Care Coordination   SW placed an unsuccessful outbound call to the patient upon chart review to indicate discharge from recent inpatient stay. SW contacted the patient at [phone]; with an automated response stating "due to network difficulties your call cannot be completed at this time. Please try your call again later".  SW performed chart review to note TOC call completed by patients provider office with no acute needs noted. SW will follow up with the patient over the next week to address referral to case management program from [place].   Daneen Schick, BSW, CDP Social Worker, Certified Dementia Practitioner [place] [facility] [place] [facility] [phone]

## 2018-12-14 NOTE — Telephone Encounter (Signed)
Transition Care Management Follow-up Telephone Call  Date of discharge and from where: 12/13/2018 [facility]  How have you been since you were released from the hospital? Much better  Any questions or concerns? Want to know what diet the pt can have that is heart healthy and for a perforated ulcer and also if a home nurse could come out.  Items Reviewed:  Did the pt receive and understand the discharge instructions provided? Yes  Medications obtained and verified? Yes  Any new allergies since your discharge? No  Dietary orders reviewed? NO  Do you have support at home? Yes   Other (ie: DME, Home Health, etc) Yes  Functional Questionnaire: (I = Independent and D = Dependent) ADL's: D  Bathing/Dressing- I   Meal Prep-  I Eating- I  Maintaining continence- I  Transferring/Ambulation- D  Managing Meds-D   Follow up appointments reviewed:    PCP Hospital f/u appt confirmed?  yes Scheduled to see Dr. Baird Cancer 12/17/2018  Specialist Hospital f/u appt confirmed?  N/A  Are transportation arrangements needed? No  If their condition worsens, is the pt aware to call  their PCP or go to the ED? Yes  Was the patient provided with contact information for the PCP's office or ED? Yes  Was the pt encouraged to call back with questions or  concerns?

## 2018-12-17 ENCOUNTER — Other Ambulatory Visit: Payer: Self-pay

## 2018-12-17 ENCOUNTER — Encounter: Payer: Self-pay | Admitting: Internal Medicine

## 2018-12-17 ENCOUNTER — Encounter: Payer: Medicare Other | Admitting: Internal Medicine

## 2018-12-17 ENCOUNTER — Ambulatory Visit: Payer: Medicare Other | Admitting: Internal Medicine

## 2018-12-17 NOTE — Progress Notes (Signed)
The patient never answered the phone for her phone visit.   This encounter was created in error - please disregard.

## 2018-12-20 DIAGNOSIS — S21109D Unspecified open wound of unspecified front wall of thorax without penetration into thoracic cavity, subsequent encounter: Secondary | ICD-10-CM | POA: Diagnosis not present

## 2018-12-20 DIAGNOSIS — I4891 Unspecified atrial fibrillation: Secondary | ICD-10-CM | POA: Diagnosis not present

## 2018-12-20 DIAGNOSIS — K5730 Diverticulosis of large intestine without perforation or abscess without bleeding: Secondary | ICD-10-CM

## 2018-12-20 DIAGNOSIS — N179 Acute kidney failure, unspecified: Secondary | ICD-10-CM

## 2018-12-20 DIAGNOSIS — I110 Hypertensive heart disease with heart failure: Secondary | ICD-10-CM | POA: Diagnosis not present

## 2018-12-20 DIAGNOSIS — M1990 Unspecified osteoarthritis, unspecified site: Secondary | ICD-10-CM | POA: Diagnosis not present

## 2018-12-20 DIAGNOSIS — J9602 Acute respiratory failure with hypercapnia: Secondary | ICD-10-CM | POA: Diagnosis not present

## 2018-12-20 DIAGNOSIS — D696 Thrombocytopenia, unspecified: Secondary | ICD-10-CM | POA: Diagnosis not present

## 2018-12-20 DIAGNOSIS — E875 Hyperkalemia: Secondary | ICD-10-CM | POA: Diagnosis not present

## 2018-12-20 DIAGNOSIS — E872 Acidosis: Secondary | ICD-10-CM

## 2018-12-20 DIAGNOSIS — K631 Perforation of intestine (nontraumatic): Secondary | ICD-10-CM | POA: Diagnosis not present

## 2018-12-20 DIAGNOSIS — Z602 Problems related to living alone: Secondary | ICD-10-CM | POA: Diagnosis not present

## 2018-12-20 DIAGNOSIS — E785 Hyperlipidemia, unspecified: Secondary | ICD-10-CM

## 2018-12-20 DIAGNOSIS — Z9181 History of falling: Secondary | ICD-10-CM | POA: Diagnosis not present

## 2018-12-20 DIAGNOSIS — Z7951 Long term (current) use of inhaled steroids: Secondary | ICD-10-CM | POA: Diagnosis not present

## 2018-12-20 DIAGNOSIS — I700 Atherosclerosis of aorta: Secondary | ICD-10-CM

## 2018-12-20 DIAGNOSIS — I447 Left bundle-branch block, unspecified: Secondary | ICD-10-CM | POA: Diagnosis not present

## 2018-12-20 DIAGNOSIS — I501 Left ventricular failure: Secondary | ICD-10-CM | POA: Diagnosis not present

## 2018-12-20 DIAGNOSIS — I5020 Unspecified systolic (congestive) heart failure: Secondary | ICD-10-CM | POA: Diagnosis not present

## 2018-12-21 ENCOUNTER — Ambulatory Visit: Payer: Self-pay

## 2018-12-21 DIAGNOSIS — E1122 Type 2 diabetes mellitus with diabetic chronic kidney disease: Secondary | ICD-10-CM

## 2018-12-21 NOTE — Chronic Care Management (AMB) (Signed)
  Chronic Care Management   Outreach Note  12/21/2018 Name: Mariah Aguilar MRN: [id number] DOB: 05/07/1922  Referred by: Glendale Chard, MD Reason for referral : Care Coordination   SW placed a third unsuccessful outbound call to the patient to follow up on referral by [place]. After several rings an automated recording stated "Welcome to [employer]. The number you have dialed has been changed, disconnected, or is not longer in service."  Follow Up Plan: No further follow up required: SW has collaborated with patients primary care provider regarding future referrals as indicated.  Daneen Schick, BSW, CDP Social Worker, Certified Dementia Practitioner [place] [facility] [place] [facility] [phone]

## 2018-12-24 DIAGNOSIS — I5020 Unspecified systolic (congestive) heart failure: Secondary | ICD-10-CM | POA: Diagnosis not present

## 2018-12-24 DIAGNOSIS — S21109D Unspecified open wound of unspecified front wall of thorax without penetration into thoracic cavity, subsequent encounter: Secondary | ICD-10-CM | POA: Diagnosis not present

## 2018-12-24 DIAGNOSIS — J9602 Acute respiratory failure with hypercapnia: Secondary | ICD-10-CM | POA: Diagnosis not present

## 2018-12-24 DIAGNOSIS — I501 Left ventricular failure: Secondary | ICD-10-CM | POA: Diagnosis not present

## 2018-12-24 DIAGNOSIS — K631 Perforation of intestine (nontraumatic): Secondary | ICD-10-CM | POA: Diagnosis not present

## 2018-12-24 DIAGNOSIS — I110 Hypertensive heart disease with heart failure: Secondary | ICD-10-CM | POA: Diagnosis not present

## 2018-12-25 DIAGNOSIS — I501 Left ventricular failure: Secondary | ICD-10-CM | POA: Diagnosis not present

## 2018-12-25 DIAGNOSIS — J9602 Acute respiratory failure with hypercapnia: Secondary | ICD-10-CM | POA: Diagnosis not present

## 2018-12-25 DIAGNOSIS — I110 Hypertensive heart disease with heart failure: Secondary | ICD-10-CM | POA: Diagnosis not present

## 2018-12-25 DIAGNOSIS — K631 Perforation of intestine (nontraumatic): Secondary | ICD-10-CM | POA: Diagnosis not present

## 2018-12-25 DIAGNOSIS — I5020 Unspecified systolic (congestive) heart failure: Secondary | ICD-10-CM | POA: Diagnosis not present

## 2018-12-25 DIAGNOSIS — S21109D Unspecified open wound of unspecified front wall of thorax without penetration into thoracic cavity, subsequent encounter: Secondary | ICD-10-CM | POA: Diagnosis not present

## 2018-12-26 ENCOUNTER — Other Ambulatory Visit: Payer: Self-pay | Admitting: Internal Medicine

## 2018-12-28 ENCOUNTER — Telehealth: Payer: Self-pay

## 2018-12-28 ENCOUNTER — Ambulatory Visit: Payer: Medicare Other | Admitting: Internal Medicine

## 2018-12-28 DIAGNOSIS — I5020 Unspecified systolic (congestive) heart failure: Secondary | ICD-10-CM | POA: Diagnosis not present

## 2018-12-28 DIAGNOSIS — J9602 Acute respiratory failure with hypercapnia: Secondary | ICD-10-CM | POA: Diagnosis not present

## 2018-12-28 DIAGNOSIS — I501 Left ventricular failure: Secondary | ICD-10-CM | POA: Diagnosis not present

## 2018-12-28 DIAGNOSIS — I110 Hypertensive heart disease with heart failure: Secondary | ICD-10-CM | POA: Diagnosis not present

## 2018-12-28 DIAGNOSIS — S21109D Unspecified open wound of unspecified front wall of thorax without penetration into thoracic cavity, subsequent encounter: Secondary | ICD-10-CM | POA: Diagnosis not present

## 2018-12-28 DIAGNOSIS — K631 Perforation of intestine (nontraumatic): Secondary | ICD-10-CM | POA: Diagnosis not present

## 2018-12-28 NOTE — Telephone Encounter (Signed)
The pt's granddaughter Ulla Gallo wanted to have a DME form filled out and sent to [facility] so the pt can have a home aid for a few hours a day. Ms. Volanda Napoleon was told to discuss what the pt needs during her virtual hospital f/u and that I will make a note that she called.

## 2018-12-29 DIAGNOSIS — I110 Hypertensive heart disease with heart failure: Secondary | ICD-10-CM | POA: Diagnosis not present

## 2018-12-29 DIAGNOSIS — K631 Perforation of intestine (nontraumatic): Secondary | ICD-10-CM | POA: Diagnosis not present

## 2018-12-29 DIAGNOSIS — J9602 Acute respiratory failure with hypercapnia: Secondary | ICD-10-CM | POA: Diagnosis not present

## 2018-12-29 DIAGNOSIS — I5020 Unspecified systolic (congestive) heart failure: Secondary | ICD-10-CM | POA: Diagnosis not present

## 2018-12-29 DIAGNOSIS — I501 Left ventricular failure: Secondary | ICD-10-CM | POA: Diagnosis not present

## 2018-12-29 DIAGNOSIS — S21109D Unspecified open wound of unspecified front wall of thorax without penetration into thoracic cavity, subsequent encounter: Secondary | ICD-10-CM | POA: Diagnosis not present

## 2018-12-30 DIAGNOSIS — K631 Perforation of intestine (nontraumatic): Secondary | ICD-10-CM | POA: Diagnosis not present

## 2018-12-30 DIAGNOSIS — I501 Left ventricular failure: Secondary | ICD-10-CM | POA: Diagnosis not present

## 2018-12-30 DIAGNOSIS — J9602 Acute respiratory failure with hypercapnia: Secondary | ICD-10-CM | POA: Diagnosis not present

## 2018-12-30 DIAGNOSIS — S21109D Unspecified open wound of unspecified front wall of thorax without penetration into thoracic cavity, subsequent encounter: Secondary | ICD-10-CM | POA: Diagnosis not present

## 2018-12-30 DIAGNOSIS — I5020 Unspecified systolic (congestive) heart failure: Secondary | ICD-10-CM | POA: Diagnosis not present

## 2018-12-30 DIAGNOSIS — I110 Hypertensive heart disease with heart failure: Secondary | ICD-10-CM | POA: Diagnosis not present

## 2019-01-04 DIAGNOSIS — I5020 Unspecified systolic (congestive) heart failure: Secondary | ICD-10-CM | POA: Diagnosis not present

## 2019-01-04 DIAGNOSIS — I110 Hypertensive heart disease with heart failure: Secondary | ICD-10-CM | POA: Diagnosis not present

## 2019-01-04 DIAGNOSIS — S21109D Unspecified open wound of unspecified front wall of thorax without penetration into thoracic cavity, subsequent encounter: Secondary | ICD-10-CM | POA: Diagnosis not present

## 2019-01-04 DIAGNOSIS — I501 Left ventricular failure: Secondary | ICD-10-CM | POA: Diagnosis not present

## 2019-01-04 DIAGNOSIS — K631 Perforation of intestine (nontraumatic): Secondary | ICD-10-CM | POA: Diagnosis not present

## 2019-01-04 DIAGNOSIS — J9602 Acute respiratory failure with hypercapnia: Secondary | ICD-10-CM | POA: Diagnosis not present

## 2019-01-05 ENCOUNTER — Encounter: Payer: Self-pay | Admitting: Nurse Practitioner

## 2019-01-05 ENCOUNTER — Inpatient Hospital Stay: Payer: Medicare Other | Admitting: Nurse Practitioner

## 2019-01-05 ENCOUNTER — Other Ambulatory Visit: Payer: Self-pay

## 2019-01-05 ENCOUNTER — Telehealth (INDEPENDENT_AMBULATORY_CARE_PROVIDER_SITE_OTHER): Payer: Medicare Other | Admitting: Nurse Practitioner

## 2019-01-05 VITALS — BP 117/77 | HR 80 | Wt 134.0 lb

## 2019-01-05 DIAGNOSIS — R001 Bradycardia, unspecified: Secondary | ICD-10-CM

## 2019-01-05 DIAGNOSIS — K631 Perforation of intestine (nontraumatic): Secondary | ICD-10-CM | POA: Diagnosis not present

## 2019-01-05 DIAGNOSIS — S21109D Unspecified open wound of unspecified front wall of thorax without penetration into thoracic cavity, subsequent encounter: Secondary | ICD-10-CM | POA: Diagnosis not present

## 2019-01-05 DIAGNOSIS — I5020 Unspecified systolic (congestive) heart failure: Secondary | ICD-10-CM | POA: Diagnosis not present

## 2019-01-05 DIAGNOSIS — I5042 Chronic combined systolic (congestive) and diastolic (congestive) heart failure: Secondary | ICD-10-CM | POA: Diagnosis not present

## 2019-01-05 DIAGNOSIS — J9602 Acute respiratory failure with hypercapnia: Secondary | ICD-10-CM | POA: Diagnosis not present

## 2019-01-05 DIAGNOSIS — I110 Hypertensive heart disease with heart failure: Secondary | ICD-10-CM | POA: Diagnosis not present

## 2019-01-05 DIAGNOSIS — I501 Left ventricular failure: Secondary | ICD-10-CM | POA: Diagnosis not present

## 2019-01-05 MED ORDER — FLUTICASONE FUROATE-VILANTEROL 200-25 MCG/INH IN AEPB: 1.0000 | INHALATION_SPRAY | Freq: Every day | RESPIRATORY_TRACT | 3 refills | Status: AC

## 2019-01-05 NOTE — Progress Notes (Signed)
Virtual Visit via Video   This visit type was conducted due to national recommendations for restrictions regarding the COVID-19 Pandemic (e.g. social distancing) in an effort to limit this patient's exposure and mitigate transmission in our community.  Due to her co-morbid illnesses, this patient is at least at moderate risk for complications without adequate follow up.  This format is felt to be most appropriate for this patient at this time.  All issues noted in this document were discussed and addressed.  A limited physical exam was performed with this format.    This visit type was conducted due to national recommendations for restrictions regarding the COVID-19 Pandemic (e.g. social distancing) in an effort to limit this patient's exposure and mitigate transmission in our community.  Patients identity confirmed using two different identifiers.  This format is felt to be most appropriate for this patient at this time.  All issues noted in this document were discussed and addressed.  No physical exam was performed (except for noted visual exam findings with Video Visits).    Date:  01/06/2019   ID:  Makhia, Moros [birth year], MRN [id number]  Patient Location:  Home - spoke with Scherrie November  Provider location:   Office    Chief Complaint:    History of Present Illness:    Jericca JAVON AUMANN is a 85 y.o. female who presents via video conferencing for a telehealth visit today.    The patient does not have symptoms concerning for COVID-19 infection (fever, chills, cough, or new shortness of breath).   Hospital follow up from 12/08/2018 -12/13/2018.  She went straight home from the hospital.  The therapist has come once and to return Wednesday.  She had a HHA to come do a bath.  The nurse is to come on Friday.  She denies chest pain or dizziness. Her granddaughter [name] knows the doctors names.  She is improving unable to walk on her own.  Alvis Lemmings - nurse, HHA, PT and OT.  Difficulty using the bathroom.  Not sleeping well trying to work on her napping.  She is starting to get in a little routine, had been confused but is better now.  She is able to dress herself and feed.   She is wanting CAP for her mother through [facility].  Needs the form completed.  She is unable to get to the office due to her weakness. She was given a steroid inhaler Breo Ellipta.     She does not need anything according to the patient, has a high toilet seat at this time.  Denies any falls.  She has family present at all times.  Dr. Ophelia Charter appt in next week.  I spoke with her granddaughter [name] who verbalizes her grandmother is more weak and unable to stay alone.      Past Medical History:  Diagnosis Date  . Arthritis   . Cardiomyopathy   . Cecal neoplasm   . CHF (congestive heart failure) ([place])   . Colon polyps   . Diverticulosis   . Hyperlipidemia   . Hypertension   . Insomnia   . Internal hemorrhoids   . Left bundle branch block (LBBB) on electrocardiogram    EKG of 5'12 shows 1st degree AV block/LBBB -Epic.   Past Surgical History:  Procedure Laterality Date  . ABDOMINAL HYSTERECTOMY    . COLON SURGERY    . COLONOSCOPY  05/27/2011   Procedure: COLONOSCOPY;  Surgeon: Juanita Craver, MD;  Location: [facility] ENDOSCOPY;  Service:  Endoscopy;  Laterality: N/A;  . COLONOSCOPY WITH PROPOFOL N/A 06/30/2014   Procedure: COLONOSCOPY WITH PROPOFOL;  Surgeon: Juanita Craver, MD;  Location: [facility] ENDOSCOPY;  Service: Endoscopy;  Laterality: N/A;     Current Meds  Medication Sig  . amiodarone (PACERONE) 100 MG tablet Take 1 tablet (100 mg total) by mouth daily.  Marland Kitchen DEXILANT 60 MG capsule TAKE 1 CAPSULE BY MOUTH EVERY DAY  . fluticasone furoate-vilanterol (BREO ELLIPTA) 200-25 MCG/INH AEPB Inhale 1 puff into the lungs daily.  . furosemide (LASIX) 40 MG tablet Take 0.5-1 tablets (20-40 mg total) by mouth 2 (two) times daily. Take 40mg  in the morning and 20mg  at lunch. (Patient taking differently:  Take 40 mg by mouth 2 (two) times daily. )  . Iron Polysacch Cmplx-B12-FA (NIFEREX-150 FORTE PO) Take 150 mg by mouth daily.   . potassium chloride (KLOR-CON) 8 MEQ CR tablet Take 8 mEq by mouth daily.    . sacubitril-valsartan (ENTRESTO) 49-51 MG Take 1 tablet by mouth 2 (two) times daily.  Marland Kitchen Spacer/Aero-Holding Chambers (AEROCHAMBER PLUS) inhaler Use as instructed  . VENTOLIN HFA 108 (90 Base) MCG/ACT inhaler TAKE 2 PUFFS BY MOUTH EVERY 6 HOURS AS NEEDED FOR WHEEZE OR SHORTNESS OF BREATH (Patient taking differently: Inhale 2 puffs into the lungs every 6 (six) hours as needed for wheezing or shortness of breath. )  . [DISCONTINUED] fluticasone furoate-vilanterol (BREO ELLIPTA) 200-25 MCG/INH AEPB Inhale 1 puff into the lungs daily.     Allergies:   Shrimp [shellfish allergy] and Penicillins   Social History   Tobacco Use  . Smoking status: Never Smoker  . Smokeless tobacco: Never Used  Substance Use Topics  . Alcohol use: No  . Drug use: No     Family Hx: The patient's family history includes Cancer in her mother; Diabetes in her sister; Stroke in her father.  ROS:   Please see the history of present illness.    Review of Systems  Constitutional: Negative.   Respiratory: Negative.  Negative for cough.   Cardiovascular: Negative.  Negative for palpitations.  Neurological: Positive for weakness (generalized). Negative for dizziness and tingling.  Psychiatric/Behavioral: Negative.     All other systems reviewed and are negative.   Labs/Other Tests and Data Reviewed:    Recent Labs: 11/25/2018: TSH 2.112 12/08/2018: ALT 146; B Natriuretic Peptide 2,231.5 12/10/2018: Magnesium 2.4 12/13/2018: BUN 11; Creatinine, Ser 0.89; Hemoglobin 12.1; Platelets 577; Potassium 3.9; Sodium 138   Recent Lipid Panel Lab Results  Component Value Date/Time   CHOL 123 05/19/2018 12:45 PM   TRIG 89 05/19/2018 12:45 PM   HDL 45 05/19/2018 12:45 PM   CHOLHDL 2.7 05/19/2018 12:45 PM   LDLCALC 60  05/19/2018 12:45 PM    Wt Readings from Last 3 Encounters:  01/05/19 134 lb (60.8 kg)  12/13/18 134 lb 14.7 oz (61.2 kg)  11/27/18 122 lb 11.2 oz (55.7 kg)     Exam:    Vital Signs:  BP 117/77   Pulse 80   Wt 134 lb (60.8 kg)   BMI 25.32 kg/m     Physical Exam  Constitutional: She is oriented to person, place, and time and well-developed, well-nourished, and in no distress. No distress.  Pulmonary/Chest: No respiratory distress.  Neurological: She is alert and oriented to person, place, and time.  Psychiatric: Mood, memory, affect and judgment normal.    ASSESSMENT & PLAN:    1. Chronic combined systolic and diastolic heart failure (HCC) She has a follow up with Dr  Harwani TCM Performed. A member of the clinical team spoke with the patient upon dischare. Discharge summary was reviewed in full detail during the visit. Meds reconciled and compared to discharge meds. Medication list is updated and reviewed with the patient.  Greater than 50% face to face time was spent in counseling an coordination of care.  All questions were answered to the satisfaction of the patient.    - fluticasone furoate-vilanterol (BREO ELLIPTA) 200-25 MCG/INH AEPB; Inhale 1 puff into the lungs daily.  Dispense: 1 each; Refill: 3  2. Perforated bowel ([place])  Thought to have a perforated ulcer during the most recent hospitalization caused her to have encephalopathy which she is doing better and able to communicate  3. Bradycardia  She had Bradycardia while on coreg so this has been discontinued while hospitalized  She is to follow up with Dr. Terrence Dupont   COVID-19 Education: The signs and symptoms of COVID-19 were discussed with the patient and how to seek care for testing (follow up with PCP or arrange E-visit).  The importance of social distancing was discussed today.  Patient Risk:   After full review of this patients clinical status, I feel that they are at least moderate risk at this  time.  Time:   Today, I have spent 23 minutes/ seconds with the patient with telehealth technology discussing above diagnoses.     Medication Adjustments/Labs and Tests Ordered: Current medicines are reviewed at length with the patient today.  Concerns regarding medicines are outlined above.   Tests Ordered: No orders of the defined types were placed in this encounter.   Medication Changes: Meds ordered this encounter  Medications  . fluticasone furoate-vilanterol (BREO ELLIPTA) 200-25 MCG/INH AEPB    Sig: Inhale 1 puff into the lungs daily.    Dispense:  1 each    Refill:  3    Disposition:  Follow up prn  Signed, Minette Brine, FNP

## 2019-01-06 ENCOUNTER — Encounter: Payer: Self-pay | Admitting: Nurse Practitioner

## 2019-01-06 ENCOUNTER — Other Ambulatory Visit: Payer: Self-pay

## 2019-01-06 DIAGNOSIS — I5020 Unspecified systolic (congestive) heart failure: Secondary | ICD-10-CM | POA: Diagnosis not present

## 2019-01-06 DIAGNOSIS — I501 Left ventricular failure: Secondary | ICD-10-CM | POA: Diagnosis not present

## 2019-01-06 DIAGNOSIS — T148XXA Other injury of unspecified body region, initial encounter: Secondary | ICD-10-CM

## 2019-01-06 DIAGNOSIS — K631 Perforation of intestine (nontraumatic): Secondary | ICD-10-CM | POA: Diagnosis not present

## 2019-01-06 DIAGNOSIS — S21109D Unspecified open wound of unspecified front wall of thorax without penetration into thoracic cavity, subsequent encounter: Secondary | ICD-10-CM | POA: Diagnosis not present

## 2019-01-06 DIAGNOSIS — I110 Hypertensive heart disease with heart failure: Secondary | ICD-10-CM | POA: Diagnosis not present

## 2019-01-06 DIAGNOSIS — J9602 Acute respiratory failure with hypercapnia: Secondary | ICD-10-CM | POA: Diagnosis not present

## 2019-01-07 ENCOUNTER — Other Ambulatory Visit: Payer: Self-pay | Admitting: Internal Medicine

## 2019-01-11 DIAGNOSIS — S21109D Unspecified open wound of unspecified front wall of thorax without penetration into thoracic cavity, subsequent encounter: Secondary | ICD-10-CM | POA: Diagnosis not present

## 2019-01-11 DIAGNOSIS — I110 Hypertensive heart disease with heart failure: Secondary | ICD-10-CM | POA: Diagnosis not present

## 2019-01-11 DIAGNOSIS — K631 Perforation of intestine (nontraumatic): Secondary | ICD-10-CM | POA: Diagnosis not present

## 2019-01-11 DIAGNOSIS — J9602 Acute respiratory failure with hypercapnia: Secondary | ICD-10-CM | POA: Diagnosis not present

## 2019-01-11 DIAGNOSIS — I5020 Unspecified systolic (congestive) heart failure: Secondary | ICD-10-CM | POA: Diagnosis not present

## 2019-01-11 DIAGNOSIS — I501 Left ventricular failure: Secondary | ICD-10-CM | POA: Diagnosis not present

## 2019-01-12 ENCOUNTER — Other Ambulatory Visit: Payer: Self-pay | Admitting: Nurse Practitioner

## 2019-01-12 ENCOUNTER — Telehealth: Payer: Self-pay

## 2019-01-12 ENCOUNTER — Other Ambulatory Visit: Payer: Self-pay

## 2019-01-12 DIAGNOSIS — Z741 Need for assistance with personal care: Secondary | ICD-10-CM

## 2019-01-12 DIAGNOSIS — I5020 Unspecified systolic (congestive) heart failure: Secondary | ICD-10-CM | POA: Diagnosis not present

## 2019-01-12 DIAGNOSIS — I501 Left ventricular failure: Secondary | ICD-10-CM | POA: Diagnosis not present

## 2019-01-12 DIAGNOSIS — S21109D Unspecified open wound of unspecified front wall of thorax without penetration into thoracic cavity, subsequent encounter: Secondary | ICD-10-CM | POA: Diagnosis not present

## 2019-01-12 DIAGNOSIS — K631 Perforation of intestine (nontraumatic): Secondary | ICD-10-CM | POA: Diagnosis not present

## 2019-01-12 DIAGNOSIS — I110 Hypertensive heart disease with heart failure: Secondary | ICD-10-CM | POA: Diagnosis not present

## 2019-01-12 DIAGNOSIS — J9602 Acute respiratory failure with hypercapnia: Secondary | ICD-10-CM | POA: Diagnosis not present

## 2019-01-12 NOTE — Telephone Encounter (Signed)
Left a message for the pt's granddaughter to call the office back.  I was returning a call to the pt's granddaughter Ulla Gallo to let her know that the office social worker usually handles these things but that the family declined the service.

## 2019-01-12 NOTE — Telephone Encounter (Signed)
The pt's granddaughter  Mariah Aguilar was notified that Laurance Flatten, NP is working on the [employer] form and that it would be a great thing if the pt would work with the office social worker but that the services was denied in the past.   Mariah Aguilar said that they would like to work with the Education officer, museum that works with the office.

## 2019-01-14 ENCOUNTER — Ambulatory Visit: Payer: Self-pay

## 2019-01-14 ENCOUNTER — Telehealth: Payer: Self-pay

## 2019-01-14 DIAGNOSIS — I501 Left ventricular failure: Secondary | ICD-10-CM | POA: Diagnosis not present

## 2019-01-14 DIAGNOSIS — I5020 Unspecified systolic (congestive) heart failure: Secondary | ICD-10-CM | POA: Diagnosis not present

## 2019-01-14 DIAGNOSIS — J9602 Acute respiratory failure with hypercapnia: Secondary | ICD-10-CM | POA: Diagnosis not present

## 2019-01-14 DIAGNOSIS — N182 Chronic kidney disease, stage 2 (mild): Secondary | ICD-10-CM

## 2019-01-14 DIAGNOSIS — S21109D Unspecified open wound of unspecified front wall of thorax without penetration into thoracic cavity, subsequent encounter: Secondary | ICD-10-CM | POA: Diagnosis not present

## 2019-01-14 DIAGNOSIS — K631 Perforation of intestine (nontraumatic): Secondary | ICD-10-CM | POA: Diagnosis not present

## 2019-01-14 DIAGNOSIS — Z741 Need for assistance with personal care: Secondary | ICD-10-CM

## 2019-01-14 DIAGNOSIS — E1122 Type 2 diabetes mellitus with diabetic chronic kidney disease: Secondary | ICD-10-CM

## 2019-01-14 DIAGNOSIS — I110 Hypertensive heart disease with heart failure: Secondary | ICD-10-CM | POA: Diagnosis not present

## 2019-01-14 NOTE — Chronic Care Management (AMB) (Signed)
  Chronic Care Management   Outreach Note  01/14/2019 Name: Mariah Aguilar MRN: [id number] DOB: Aug 25, 1922  Referred by: Minette Brine FNP Reason for referral : Care Coordination   An unsuccessful telephone outreach was attempted to the patients grand-daughter Mariah Aguilar as indicated in patient referral notes. The patient was referred to the case management team by for assistance with care management and care coordination.   Follow Up Plan: A HIPPA compliant phone message was left for the patient providing contact information and requesting a return call.  The care management team will reach out to the patient again over the next 14 days.   Daneen Schick, BSW, CDP Social Worker, Certified Dementia Practitioner [place] [facility] [place] [facility] [phone]

## 2019-01-14 NOTE — Telephone Encounter (Signed)
[name] the occupational therapist with [name] , [phone], called and left a message that the pt has been having weight gain.  12/21 it was 134.4, 12/22 137.6, 12/28 136, 12/30 138, 01/05 141.2, and 141.6 today. I called and left a message for bianca banks the granddaughter to call back so that I can let her know tht Dr. Baird Cancer wants to know if the pt has been taking her lasix and has she been eating salt.  Dr. Baird Cancer also wants the pt to contact her cardiologist Dr. Terrence Dupont.

## 2019-01-17 ENCOUNTER — Other Ambulatory Visit: Payer: Self-pay | Admitting: Internal Medicine

## 2019-01-18 ENCOUNTER — Telehealth: Payer: Self-pay

## 2019-01-18 ENCOUNTER — Ambulatory Visit: Payer: Self-pay

## 2019-01-18 DIAGNOSIS — S21109D Unspecified open wound of unspecified front wall of thorax without penetration into thoracic cavity, subsequent encounter: Secondary | ICD-10-CM | POA: Diagnosis not present

## 2019-01-18 DIAGNOSIS — Z741 Need for assistance with personal care: Secondary | ICD-10-CM

## 2019-01-18 DIAGNOSIS — I110 Hypertensive heart disease with heart failure: Secondary | ICD-10-CM | POA: Diagnosis not present

## 2019-01-18 DIAGNOSIS — I501 Left ventricular failure: Secondary | ICD-10-CM | POA: Diagnosis not present

## 2019-01-18 DIAGNOSIS — K631 Perforation of intestine (nontraumatic): Secondary | ICD-10-CM | POA: Diagnosis not present

## 2019-01-18 DIAGNOSIS — E1122 Type 2 diabetes mellitus with diabetic chronic kidney disease: Secondary | ICD-10-CM

## 2019-01-18 DIAGNOSIS — J9602 Acute respiratory failure with hypercapnia: Secondary | ICD-10-CM | POA: Diagnosis not present

## 2019-01-18 DIAGNOSIS — I5020 Unspecified systolic (congestive) heart failure: Secondary | ICD-10-CM | POA: Diagnosis not present

## 2019-01-18 NOTE — Telephone Encounter (Signed)
Mariah Aguilar the occupational therapist with Mariah Aguilar , [phone], called and left a message that the pt has been having weight gain.  12/21 it was 134.4, 12/22 137.6, 12/28 136, 12/30 138, 01/05 141.2, and 141.6 today. I called and left a message for Mariah Aguilar the granddaughter to call back so that I can let her know tht Dr. Baird Cancer wants to know if the pt has been taking her lasix and has she been eating salt.  Dr. Baird Cancer also wants the pt to contact her cardiologist Dr. Terrence Dupont. THE PT'S GRANDDAUGHTER Mariah Aguilar SAID THAT THE PT DOESN'T EAT SALT BECAUSE THEY HAVE BEEN WATCHING HER FOOD INTAKE, SHE MISSED A COUPLE OF HER LUNCH TIME LASIX BUT NOT ENOUGH TO CAUSE HER TO GAIN WEIGHT.  THE PT IS TAKING 40 MG  OF LASIX IN THE AM AND 20 MG OF LASIX AT LUNCH TIME. MS Aguilar SAID THAT SHE WILL NOTIFY THE PT'S CARDIOLOGIST.

## 2019-01-18 NOTE — Telephone Encounter (Signed)
Ms. Volanda Napoleon the pt's granddaughter as notified that the pt's FL2 form was faxed to [facility].  She was asked if Ms. Rickel still uses the nebulizer and Ms. Banks said no and that she will drop it back off by the office.

## 2019-01-18 NOTE — Chronic Care Management (AMB) (Signed)
Care Management Note   Mariah Aguilar is a 86 y.o. year old female who is a primary care patient of Glendale Chard, MD whom was referred by Minette Brine FNP. The CM team was consulted for assistance with care coordination.   Review of patient status, including review of consultants reports, rand collaboration with appropriate care team members and the patient's provider was performed as part of comprehensive patient evaluation and provision of care management services. Telephone outreach to patient today to introduce CM services.   SW spoke with the patients grand-daughter Mariah Aguilar as indicated in patient referral. HIPAA identifiers confirmed.  Outpatient Encounter Medications as of 01/18/2019  Medication Sig  . amiodarone (PACERONE) 100 MG tablet Take 1 tablet (100 mg total) by mouth daily.  Marland Kitchen DEXILANT 60 MG capsule TAKE 1 CAPSULE BY MOUTH EVERY DAY  . fluticasone furoate-vilanterol (BREO ELLIPTA) 200-25 MCG/INH AEPB Inhale 1 puff into the lungs daily.  . furosemide (LASIX) 40 MG tablet Take 0.5-1 tablets (20-40 mg total) by mouth 2 (two) times daily. Take '40mg'$  in the morning and '20mg'$  at lunch. (Patient taking differently: Take 40 mg by mouth 2 (two) times daily. )  . Iron Polysacch Cmplx-B12-FA (NIFEREX-150 FORTE PO) Take 150 mg by mouth daily.   . potassium chloride (KLOR-CON) 8 MEQ CR tablet Take 8 mEq by mouth daily.    . sacubitril-valsartan (ENTRESTO) 49-51 MG Take 1 tablet by mouth 2 (two) times daily.  Marland Kitchen Spacer/Aero-Holding Chambers (AEROCHAMBER PLUS) inhaler Use as instructed  . VENTOLIN HFA 108 (90 Base) MCG/ACT inhaler TAKE 2 PUFFS BY MOUTH EVERY 6 HOURS AS NEEDED FOR WHEEZE OR SHORTNESS OF BREATH   No facility-administered encounter medications on file as of 01/18/2019.    I reached out to [employer], grand-daughter of Mariah Aguilar by phone today.   [name] was given information about Chronic Care Management services today including:  1. CCM service includes  personalized support from designated clinical staff supervised by her physician, including individualized plan of care and coordination with other care providers 2. 24/7 contact phone numbers for assistance for urgent and routine care needs. 3. Service will only be billed when office clinical staff spend 20 minutes or more in a month to coordinate care. 4. Only one practitioner may furnish and bill the service in a calendar month. 5. The patient may stop CCM services at any time (effective at the end of the month) by phone call to the office staff. 6. The patient will be responsible for cost sharing (co-pay) of up to 20% of the service fee (after annual deductible is met).  Mariah Aguilar did not agree to enrollment in care management services and does not wish to consider at this time. The patient is actively receiving services SNV, PT, and OT from [place] [facility] [facility]. The patients grand-daughter has applied for CAP services and is in process of applying for PCS under the patients Medicaid benefit. SW briefly educated Mariah Aguilar on the [facility] process as well as provided information on [facility]. Mrs. Mariah Aguilar does not wish to enroll the patient at this time as she already feels overwhelmed coordinating services with home health and does not feel the patient would benefit from added services.   Follow Up Plan: SW provided direct contact information and advised Mrs Mariah Aguilar to contact SW if she would like to enroll the patient at a later date. Referral closed.  Daneen Schick, BSW, CDP Social Worker, Certified Dementia Practitioner [place] [facility] [place]  [facility] [phone]

## 2019-01-19 DIAGNOSIS — J9602 Acute respiratory failure with hypercapnia: Secondary | ICD-10-CM | POA: Diagnosis not present

## 2019-01-19 DIAGNOSIS — E875 Hyperkalemia: Secondary | ICD-10-CM | POA: Diagnosis not present

## 2019-01-19 DIAGNOSIS — I110 Hypertensive heart disease with heart failure: Secondary | ICD-10-CM | POA: Diagnosis not present

## 2019-01-19 DIAGNOSIS — Z7951 Long term (current) use of inhaled steroids: Secondary | ICD-10-CM | POA: Diagnosis not present

## 2019-01-19 DIAGNOSIS — Z9181 History of falling: Secondary | ICD-10-CM | POA: Diagnosis not present

## 2019-01-19 DIAGNOSIS — E872 Acidosis: Secondary | ICD-10-CM | POA: Diagnosis not present

## 2019-01-19 DIAGNOSIS — S21109D Unspecified open wound of unspecified front wall of thorax without penetration into thoracic cavity, subsequent encounter: Secondary | ICD-10-CM | POA: Diagnosis not present

## 2019-01-19 DIAGNOSIS — I4891 Unspecified atrial fibrillation: Secondary | ICD-10-CM | POA: Diagnosis not present

## 2019-01-19 DIAGNOSIS — N179 Acute kidney failure, unspecified: Secondary | ICD-10-CM | POA: Diagnosis not present

## 2019-01-19 DIAGNOSIS — Z602 Problems related to living alone: Secondary | ICD-10-CM | POA: Diagnosis not present

## 2019-01-19 DIAGNOSIS — I5020 Unspecified systolic (congestive) heart failure: Secondary | ICD-10-CM | POA: Diagnosis not present

## 2019-01-19 DIAGNOSIS — K631 Perforation of intestine (nontraumatic): Secondary | ICD-10-CM | POA: Diagnosis not present

## 2019-01-19 DIAGNOSIS — I447 Left bundle-branch block, unspecified: Secondary | ICD-10-CM | POA: Diagnosis not present

## 2019-01-19 DIAGNOSIS — D696 Thrombocytopenia, unspecified: Secondary | ICD-10-CM | POA: Diagnosis not present

## 2019-01-19 DIAGNOSIS — I700 Atherosclerosis of aorta: Secondary | ICD-10-CM | POA: Diagnosis not present

## 2019-01-19 DIAGNOSIS — E785 Hyperlipidemia, unspecified: Secondary | ICD-10-CM | POA: Diagnosis not present

## 2019-01-19 DIAGNOSIS — K5730 Diverticulosis of large intestine without perforation or abscess without bleeding: Secondary | ICD-10-CM | POA: Diagnosis not present

## 2019-01-19 DIAGNOSIS — I501 Left ventricular failure: Secondary | ICD-10-CM | POA: Diagnosis not present

## 2019-01-19 DIAGNOSIS — M1990 Unspecified osteoarthritis, unspecified site: Secondary | ICD-10-CM | POA: Diagnosis not present

## 2019-01-20 ENCOUNTER — Telehealth: Payer: Self-pay

## 2019-01-20 NOTE — Telephone Encounter (Signed)
Unable to leave a message  I was calling to inform the social worker Claire Shown that [facility] said they were out of hospital beds so the prescription was faxed to [place] [facility].

## 2019-01-20 NOTE — Telephone Encounter (Signed)
I returned a call to [place] worker with [facility] and asked where to send the pt's prescription for her hospital bed and she said that the family didn't have a preference.   The prescription was faxed to [facility].

## 2019-01-25 ENCOUNTER — Encounter (HOSPITAL_BASED_OUTPATIENT_CLINIC_OR_DEPARTMENT_OTHER): Payer: Medicare Other | Admitting: Internal Medicine

## 2019-01-26 ENCOUNTER — Telehealth: Payer: Self-pay

## 2019-01-26 NOTE — Telephone Encounter (Signed)
[email or web address] [id number] completed today.

## 2019-01-27 DIAGNOSIS — I110 Hypertensive heart disease with heart failure: Secondary | ICD-10-CM | POA: Diagnosis not present

## 2019-01-27 DIAGNOSIS — S21109D Unspecified open wound of unspecified front wall of thorax without penetration into thoracic cavity, subsequent encounter: Secondary | ICD-10-CM | POA: Diagnosis not present

## 2019-01-27 DIAGNOSIS — K631 Perforation of intestine (nontraumatic): Secondary | ICD-10-CM | POA: Diagnosis not present

## 2019-01-27 DIAGNOSIS — I5020 Unspecified systolic (congestive) heart failure: Secondary | ICD-10-CM | POA: Diagnosis not present

## 2019-01-27 DIAGNOSIS — J9602 Acute respiratory failure with hypercapnia: Secondary | ICD-10-CM | POA: Diagnosis not present

## 2019-01-27 DIAGNOSIS — I501 Left ventricular failure: Secondary | ICD-10-CM | POA: Diagnosis not present

## 2019-01-29 DIAGNOSIS — I110 Hypertensive heart disease with heart failure: Secondary | ICD-10-CM | POA: Diagnosis not present

## 2019-01-29 DIAGNOSIS — K631 Perforation of intestine (nontraumatic): Secondary | ICD-10-CM | POA: Diagnosis not present

## 2019-01-29 DIAGNOSIS — J9602 Acute respiratory failure with hypercapnia: Secondary | ICD-10-CM | POA: Diagnosis not present

## 2019-01-29 DIAGNOSIS — S21109D Unspecified open wound of unspecified front wall of thorax without penetration into thoracic cavity, subsequent encounter: Secondary | ICD-10-CM | POA: Diagnosis not present

## 2019-01-29 DIAGNOSIS — I501 Left ventricular failure: Secondary | ICD-10-CM | POA: Diagnosis not present

## 2019-01-29 DIAGNOSIS — I5020 Unspecified systolic (congestive) heart failure: Secondary | ICD-10-CM | POA: Diagnosis not present

## 2019-02-01 DIAGNOSIS — J9602 Acute respiratory failure with hypercapnia: Secondary | ICD-10-CM | POA: Diagnosis not present

## 2019-02-01 DIAGNOSIS — I501 Left ventricular failure: Secondary | ICD-10-CM | POA: Diagnosis not present

## 2019-02-01 DIAGNOSIS — S21109D Unspecified open wound of unspecified front wall of thorax without penetration into thoracic cavity, subsequent encounter: Secondary | ICD-10-CM | POA: Diagnosis not present

## 2019-02-01 DIAGNOSIS — I5020 Unspecified systolic (congestive) heart failure: Secondary | ICD-10-CM | POA: Diagnosis not present

## 2019-02-01 DIAGNOSIS — K631 Perforation of intestine (nontraumatic): Secondary | ICD-10-CM | POA: Diagnosis not present

## 2019-02-01 DIAGNOSIS — I110 Hypertensive heart disease with heart failure: Secondary | ICD-10-CM | POA: Diagnosis not present

## 2019-02-04 ENCOUNTER — Telehealth: Payer: Self-pay

## 2019-02-04 NOTE — Telephone Encounter (Signed)
I was returning the granddaughter's call to let her know that the capp paperwork is ready for pickup.

## 2019-02-05 DIAGNOSIS — S21109D Unspecified open wound of unspecified front wall of thorax without penetration into thoracic cavity, subsequent encounter: Secondary | ICD-10-CM | POA: Diagnosis not present

## 2019-02-05 DIAGNOSIS — J9602 Acute respiratory failure with hypercapnia: Secondary | ICD-10-CM | POA: Diagnosis not present

## 2019-02-05 DIAGNOSIS — I110 Hypertensive heart disease with heart failure: Secondary | ICD-10-CM | POA: Diagnosis not present

## 2019-02-05 DIAGNOSIS — I5020 Unspecified systolic (congestive) heart failure: Secondary | ICD-10-CM | POA: Diagnosis not present

## 2019-02-05 DIAGNOSIS — I501 Left ventricular failure: Secondary | ICD-10-CM | POA: Diagnosis not present

## 2019-02-05 DIAGNOSIS — K631 Perforation of intestine (nontraumatic): Secondary | ICD-10-CM | POA: Diagnosis not present

## 2019-02-08 ENCOUNTER — Encounter (HOSPITAL_BASED_OUTPATIENT_CLINIC_OR_DEPARTMENT_OTHER): Payer: Medicare Other | Admitting: Internal Medicine

## 2019-02-10 DIAGNOSIS — J9602 Acute respiratory failure with hypercapnia: Secondary | ICD-10-CM | POA: Diagnosis not present

## 2019-02-10 DIAGNOSIS — I110 Hypertensive heart disease with heart failure: Secondary | ICD-10-CM | POA: Diagnosis not present

## 2019-02-10 DIAGNOSIS — S21109D Unspecified open wound of unspecified front wall of thorax without penetration into thoracic cavity, subsequent encounter: Secondary | ICD-10-CM | POA: Diagnosis not present

## 2019-02-10 DIAGNOSIS — I501 Left ventricular failure: Secondary | ICD-10-CM | POA: Diagnosis not present

## 2019-02-10 DIAGNOSIS — K631 Perforation of intestine (nontraumatic): Secondary | ICD-10-CM | POA: Diagnosis not present

## 2019-02-10 DIAGNOSIS — I5020 Unspecified systolic (congestive) heart failure: Secondary | ICD-10-CM | POA: Diagnosis not present

## 2019-02-11 DIAGNOSIS — I5020 Unspecified systolic (congestive) heart failure: Secondary | ICD-10-CM | POA: Diagnosis not present

## 2019-02-11 DIAGNOSIS — I501 Left ventricular failure: Secondary | ICD-10-CM | POA: Diagnosis not present

## 2019-02-11 DIAGNOSIS — I110 Hypertensive heart disease with heart failure: Secondary | ICD-10-CM | POA: Diagnosis not present

## 2019-02-11 DIAGNOSIS — K631 Perforation of intestine (nontraumatic): Secondary | ICD-10-CM | POA: Diagnosis not present

## 2019-02-11 DIAGNOSIS — J9602 Acute respiratory failure with hypercapnia: Secondary | ICD-10-CM | POA: Diagnosis not present

## 2019-02-11 DIAGNOSIS — S21109D Unspecified open wound of unspecified front wall of thorax without penetration into thoracic cavity, subsequent encounter: Secondary | ICD-10-CM | POA: Diagnosis not present

## 2019-02-13 ENCOUNTER — Inpatient Hospital Stay (HOSPITAL_COMMUNITY): Payer: Medicare Other

## 2019-02-13 ENCOUNTER — Inpatient Hospital Stay (HOSPITAL_COMMUNITY)
Admission: EM | Admit: 2019-02-13 | Discharge: 2019-03-08 | DRG: 208 | Disposition: E | Payer: Medicare Other | Attending: Pulmonary Disease | Admitting: Pulmonary Disease

## 2019-02-13 ENCOUNTER — Other Ambulatory Visit: Payer: Self-pay | Admitting: Nurse Practitioner

## 2019-02-13 ENCOUNTER — Emergency Department (HOSPITAL_COMMUNITY): Payer: Medicare Other

## 2019-02-13 DIAGNOSIS — I468 Cardiac arrest due to other underlying condition: Secondary | ICD-10-CM | POA: Diagnosis present

## 2019-02-13 DIAGNOSIS — I480 Paroxysmal atrial fibrillation: Secondary | ICD-10-CM | POA: Diagnosis present

## 2019-02-13 DIAGNOSIS — R609 Edema, unspecified: Secondary | ICD-10-CM | POA: Diagnosis not present

## 2019-02-13 DIAGNOSIS — J9602 Acute respiratory failure with hypercapnia: Secondary | ICD-10-CM | POA: Diagnosis present

## 2019-02-13 DIAGNOSIS — I2609 Other pulmonary embolism with acute cor pulmonale: Secondary | ICD-10-CM | POA: Diagnosis not present

## 2019-02-13 DIAGNOSIS — R579 Shock, unspecified: Secondary | ICD-10-CM | POA: Diagnosis not present

## 2019-02-13 DIAGNOSIS — Z880 Allergy status to penicillin: Secondary | ICD-10-CM | POA: Diagnosis not present

## 2019-02-13 DIAGNOSIS — I248 Other forms of acute ischemic heart disease: Secondary | ICD-10-CM | POA: Diagnosis present

## 2019-02-13 DIAGNOSIS — E11649 Type 2 diabetes mellitus with hypoglycemia without coma: Secondary | ICD-10-CM | POA: Diagnosis present

## 2019-02-13 DIAGNOSIS — Z833 Family history of diabetes mellitus: Secondary | ICD-10-CM

## 2019-02-13 DIAGNOSIS — Z85038 Personal history of other malignant neoplasm of large intestine: Secondary | ICD-10-CM

## 2019-02-13 DIAGNOSIS — R570 Cardiogenic shock: Secondary | ICD-10-CM | POA: Diagnosis present

## 2019-02-13 DIAGNOSIS — K219 Gastro-esophageal reflux disease without esophagitis: Secondary | ICD-10-CM | POA: Diagnosis present

## 2019-02-13 DIAGNOSIS — G9341 Metabolic encephalopathy: Secondary | ICD-10-CM | POA: Diagnosis present

## 2019-02-13 DIAGNOSIS — I130 Hypertensive heart and chronic kidney disease with heart failure and stage 1 through stage 4 chronic kidney disease, or unspecified chronic kidney disease: Secondary | ICD-10-CM | POA: Diagnosis present

## 2019-02-13 DIAGNOSIS — F419 Anxiety disorder, unspecified: Secondary | ICD-10-CM | POA: Diagnosis present

## 2019-02-13 DIAGNOSIS — R4020 Unspecified coma: Secondary | ICD-10-CM | POA: Diagnosis not present

## 2019-02-13 DIAGNOSIS — I5023 Acute on chronic systolic (congestive) heart failure: Secondary | ICD-10-CM | POA: Diagnosis present

## 2019-02-13 DIAGNOSIS — I340 Nonrheumatic mitral (valve) insufficiency: Secondary | ICD-10-CM | POA: Diagnosis present

## 2019-02-13 DIAGNOSIS — E1122 Type 2 diabetes mellitus with diabetic chronic kidney disease: Secondary | ICD-10-CM | POA: Diagnosis present

## 2019-02-13 DIAGNOSIS — I4510 Unspecified right bundle-branch block: Secondary | ICD-10-CM | POA: Diagnosis present

## 2019-02-13 DIAGNOSIS — I82811 Embolism and thrombosis of superficial veins of right lower extremities: Secondary | ICD-10-CM | POA: Diagnosis present

## 2019-02-13 DIAGNOSIS — I462 Cardiac arrest due to underlying cardiac condition: Secondary | ICD-10-CM | POA: Diagnosis not present

## 2019-02-13 DIAGNOSIS — I82453 Acute embolism and thrombosis of peroneal vein, bilateral: Secondary | ICD-10-CM | POA: Diagnosis present

## 2019-02-13 DIAGNOSIS — Z91013 Allergy to seafood: Secondary | ICD-10-CM

## 2019-02-13 DIAGNOSIS — Z90710 Acquired absence of both cervix and uterus: Secondary | ICD-10-CM

## 2019-02-13 DIAGNOSIS — M1990 Unspecified osteoarthritis, unspecified site: Secondary | ICD-10-CM | POA: Diagnosis present

## 2019-02-13 DIAGNOSIS — Z452 Encounter for adjustment and management of vascular access device: Secondary | ICD-10-CM | POA: Diagnosis not present

## 2019-02-13 DIAGNOSIS — J9601 Acute respiratory failure with hypoxia: Secondary | ICD-10-CM | POA: Diagnosis present

## 2019-02-13 DIAGNOSIS — Z8711 Personal history of peptic ulcer disease: Secondary | ICD-10-CM

## 2019-02-13 DIAGNOSIS — I959 Hypotension, unspecified: Secondary | ICD-10-CM | POA: Diagnosis not present

## 2019-02-13 DIAGNOSIS — Z800 Family history of malignant neoplasm of digestive organs: Secondary | ICD-10-CM

## 2019-02-13 DIAGNOSIS — I82461 Acute embolism and thrombosis of right calf muscular vein: Secondary | ICD-10-CM | POA: Diagnosis present

## 2019-02-13 DIAGNOSIS — Z86010 Personal history of colonic polyps: Secondary | ICD-10-CM

## 2019-02-13 DIAGNOSIS — R680 Hypothermia, not associated with low environmental temperature: Secondary | ICD-10-CM | POA: Diagnosis present

## 2019-02-13 DIAGNOSIS — Z515 Encounter for palliative care: Secondary | ICD-10-CM

## 2019-02-13 DIAGNOSIS — Z20822 Contact with and (suspected) exposure to covid-19: Secondary | ICD-10-CM | POA: Diagnosis present

## 2019-02-13 DIAGNOSIS — I420 Dilated cardiomyopathy: Secondary | ICD-10-CM | POA: Diagnosis present

## 2019-02-13 DIAGNOSIS — G9340 Encephalopathy, unspecified: Secondary | ICD-10-CM | POA: Diagnosis not present

## 2019-02-13 DIAGNOSIS — G931 Anoxic brain damage, not elsewhere classified: Secondary | ICD-10-CM | POA: Diagnosis present

## 2019-02-13 DIAGNOSIS — E875 Hyperkalemia: Secondary | ICD-10-CM | POA: Diagnosis present

## 2019-02-13 DIAGNOSIS — Z823 Family history of stroke: Secondary | ICD-10-CM

## 2019-02-13 DIAGNOSIS — E878 Other disorders of electrolyte and fluid balance, not elsewhere classified: Secondary | ICD-10-CM | POA: Diagnosis present

## 2019-02-13 DIAGNOSIS — N170 Acute kidney failure with tubular necrosis: Secondary | ICD-10-CM | POA: Diagnosis present

## 2019-02-13 DIAGNOSIS — I313 Pericardial effusion (noninflammatory): Secondary | ICD-10-CM | POA: Diagnosis present

## 2019-02-13 DIAGNOSIS — E872 Acidosis: Secondary | ICD-10-CM | POA: Diagnosis present

## 2019-02-13 DIAGNOSIS — R404 Transient alteration of awareness: Secondary | ICD-10-CM | POA: Diagnosis not present

## 2019-02-13 DIAGNOSIS — I469 Cardiac arrest, cause unspecified: Secondary | ICD-10-CM

## 2019-02-13 DIAGNOSIS — I447 Left bundle-branch block, unspecified: Secondary | ICD-10-CM | POA: Diagnosis present

## 2019-02-13 DIAGNOSIS — Z79899 Other long term (current) drug therapy: Secondary | ICD-10-CM

## 2019-02-13 DIAGNOSIS — I499 Cardiac arrhythmia, unspecified: Secondary | ICD-10-CM | POA: Diagnosis not present

## 2019-02-13 DIAGNOSIS — E785 Hyperlipidemia, unspecified: Secondary | ICD-10-CM | POA: Diagnosis present

## 2019-02-13 DIAGNOSIS — Z66 Do not resuscitate: Secondary | ICD-10-CM | POA: Diagnosis not present

## 2019-02-13 DIAGNOSIS — F05 Delirium due to known physiological condition: Secondary | ICD-10-CM | POA: Diagnosis present

## 2019-02-13 DIAGNOSIS — I82443 Acute embolism and thrombosis of tibial vein, bilateral: Secondary | ICD-10-CM | POA: Diagnosis present

## 2019-02-13 DIAGNOSIS — Z4682 Encounter for fitting and adjustment of non-vascular catheter: Secondary | ICD-10-CM | POA: Diagnosis not present

## 2019-02-13 DIAGNOSIS — N182 Chronic kidney disease, stage 2 (mild): Secondary | ICD-10-CM | POA: Diagnosis present

## 2019-02-13 DIAGNOSIS — R0689 Other abnormalities of breathing: Secondary | ICD-10-CM | POA: Diagnosis not present

## 2019-02-13 DIAGNOSIS — R001 Bradycardia, unspecified: Secondary | ICD-10-CM | POA: Diagnosis present

## 2019-02-13 DIAGNOSIS — R0989 Other specified symptoms and signs involving the circulatory and respiratory systems: Secondary | ICD-10-CM | POA: Diagnosis not present

## 2019-02-13 DIAGNOSIS — G4700 Insomnia, unspecified: Secondary | ICD-10-CM | POA: Diagnosis present

## 2019-02-13 LAB — COMPREHENSIVE METABOLIC PANEL
ALT: 47 U/L — ABNORMAL HIGH (ref 0–44)
AST: 82 U/L — ABNORMAL HIGH (ref 15–41)
Albumin: 2.1 g/dL — ABNORMAL LOW (ref 3.5–5.0)
Alkaline Phosphatase: 58 U/L (ref 38–126)
Anion gap: 22 — ABNORMAL HIGH (ref 5–15)
BUN: 41 mg/dL — ABNORMAL HIGH (ref 8–23)
CO2: 17 mmol/L — ABNORMAL LOW (ref 22–32)
Calcium: 9.3 mg/dL (ref 8.9–10.3)
Chloride: 101 mmol/L (ref 98–111)
Creatinine, Ser: 2.43 mg/dL — ABNORMAL HIGH (ref 0.44–1.00)
GFR calc Af Amer: 19 mL/min — ABNORMAL LOW (ref >60)
GFR calc non Af Amer: 16 mL/min — ABNORMAL LOW (ref >60)
Glucose, Bld: 338 mg/dL — ABNORMAL HIGH (ref 70–99)
Potassium: 5.1 mmol/L (ref 3.5–5.1)
Sodium: 140 mmol/L (ref 135–145)
Total Bilirubin: 1.5 mg/dL — ABNORMAL HIGH (ref 0.3–1.2)
Total Protein: 4.8 g/dL — ABNORMAL LOW (ref 6.5–8.1)

## 2019-02-13 LAB — POCT I-STAT 7, (LYTES, BLD GAS, ICA,H+H)
Acid-base deficit: 13 mmol/L — ABNORMAL HIGH (ref 0.0–2.0)
Bicarbonate: 16.3 mmol/L — ABNORMAL LOW (ref 20.0–28.0)
Calcium, Ion: 1.34 mmol/L (ref 1.15–1.40)
HCT: 43 % (ref 36.0–46.0)
Hemoglobin: 14.6 g/dL (ref 12.0–15.0)
O2 Saturation: 100 %
Patient temperature: 98.6
Potassium: 4.7 mmol/L (ref 3.5–5.1)
Sodium: 136 mmol/L (ref 135–145)
TCO2: 18 mmol/L — ABNORMAL LOW (ref 22–32)
pCO2 arterial: 51.9 mmHg — ABNORMAL HIGH (ref 32.0–48.0)
pH, Arterial: 7.106 — CL (ref 7.350–7.450)
pO2, Arterial: 260 mmHg — ABNORMAL HIGH (ref 83.0–108.0)

## 2019-02-13 LAB — CBG MONITORING, ED
Glucose-Capillary: 44 mg/dL — CL (ref 70–99)
Glucose-Capillary: 42 mg/dL — CL (ref 70–99)
Glucose-Capillary: 157 mg/dL — ABNORMAL HIGH (ref 70–99)
Glucose-Capillary: 212 mg/dL — ABNORMAL HIGH (ref 70–99)

## 2019-02-13 LAB — SAMPLE TO BLOOD BANK

## 2019-02-13 LAB — I-STAT CHEM 8, ED
BUN: 42 mg/dL — ABNORMAL HIGH (ref 8–23)
Calcium, Ion: 1.07 mmol/L — ABNORMAL LOW (ref 1.15–1.40)
Chloride: 105 mmol/L (ref 98–111)
Creatinine, Ser: 2 mg/dL — ABNORMAL HIGH (ref 0.44–1.00)
Glucose, Bld: 320 mg/dL — ABNORMAL HIGH (ref 70–99)
HCT: 45 % (ref 36.0–46.0)
Hemoglobin: 15.3 g/dL — ABNORMAL HIGH (ref 12.0–15.0)
Potassium: 4.9 mmol/L (ref 3.5–5.1)
Sodium: 137 mmol/L (ref 135–145)
TCO2: 16 mmol/L — ABNORMAL LOW (ref 22–32)

## 2019-02-13 LAB — PROTIME-INR
INR: 2 — ABNORMAL HIGH (ref 0.8–1.2)
Prothrombin Time: 23 seconds — ABNORMAL HIGH (ref 11.4–15.2)

## 2019-02-13 LAB — CDS SEROLOGY

## 2019-02-13 LAB — RESPIRATORY PANEL BY RT PCR (FLU A&B, COVID)
Influenza A by PCR: NEGATIVE
Influenza B by PCR: NEGATIVE
SARS Coronavirus 2 by RT PCR: NEGATIVE

## 2019-02-13 LAB — ETHANOL: Alcohol, Ethyl (B): <10 mg/dL (ref <10)

## 2019-02-13 LAB — LACTIC ACID, PLASMA: Lactic Acid, Venous: >11 mmol/L (ref 0.5–1.9)

## 2019-02-13 MED ORDER — SODIUM BICARBONATE 8.4 % IV SOLN
INTRAVENOUS | Status: AC
  Filled 2019-02-13: qty 50

## 2019-02-13 MED ORDER — DEXTROSE 10 % IV SOLN: INTRAVENOUS | Status: DC

## 2019-02-13 MED ORDER — SODIUM BICARBONATE 8.4 % IV SOLN
100.0000 meq | Freq: Once | INTRAVENOUS | Status: AC
  Administered 2019-02-13: 100 meq via INTRAVENOUS

## 2019-02-13 MED ORDER — EPINEPHRINE PF 1 MG/ML IJ SOLN
0.5000 ug/min | INTRAVENOUS | Status: DC
  Administered 2019-02-13: 19:00:00 5 ug/min via INTRAVENOUS
  Filled 2019-02-13: qty 4

## 2019-02-13 MED ORDER — FENTANYL CITRATE (PF) 100 MCG/2ML IJ SOLN
25.0000 ug | Freq: Once | INTRAMUSCULAR | Status: AC
  Administered 2019-02-13: 25 ug via INTRAVENOUS

## 2019-02-13 MED ORDER — DEXTROSE 50 % IV SOLN
INTRAVENOUS | Status: AC
  Filled 2019-02-13: qty 50

## 2019-02-13 MED ORDER — ASPIRIN EC 325 MG PO TBEC
325.0000 mg | DELAYED_RELEASE_TABLET | Freq: Every day | ORAL | Status: DC
  Administered 2019-02-14: 11:00:00 325 mg via ORAL
  Filled 2019-02-13 (×2): qty 1

## 2019-02-13 MED ORDER — NOREPINEPHRINE 4 MG/250ML-% IV SOLN
0.0000 ug/min | INTRAVENOUS | Status: DC
Start: 2019-02-13 — End: 2019-02-15
  Administered 2019-02-13: 21:00:00 2 ug/min via INTRAVENOUS
  Administered 2019-02-14: 19 ug/min via INTRAVENOUS
  Administered 2019-02-14: 15 ug/min via INTRAVENOUS
  Administered 2019-02-14: 23:00:00 32 ug/min via INTRAVENOUS
  Administered 2019-02-14: 20:00:00 12 ug/min via INTRAVENOUS
  Administered 2019-02-15: 01:00:00 40 ug/min via INTRAVENOUS
  Filled 2019-02-13 (×7): qty 250

## 2019-02-13 MED ORDER — EPINEPHRINE 1 MG/10ML IJ SOSY
PREFILLED_SYRINGE | INTRAMUSCULAR | Status: AC
  Filled 2019-02-13: qty 10

## 2019-02-13 MED ORDER — AMIODARONE IV BOLUS ONLY 150 MG/100ML
150.0000 mg | Freq: Once | INTRAVENOUS | Status: DC
  Filled 2019-02-13: qty 100

## 2019-02-13 MED ORDER — EPINEPHRINE PF 1 MG/ML IJ SOLN
0.5000 ug/min | INTRAVENOUS | Status: DC
  Filled 2019-02-13: qty 4

## 2019-02-13 MED ORDER — AMIODARONE LOAD VIA INFUSION
150.0000 mg | Freq: Once | INTRAVENOUS | Status: AC
  Administered 2019-02-13: 150 mg via INTRAVENOUS
  Filled 2019-02-13: qty 83.34

## 2019-02-13 MED ORDER — LACTATED RINGERS IV BOLUS
500.0000 mL | Freq: Once | INTRAVENOUS | Status: AC
  Administered 2019-02-13: 21:00:00 500 mL via INTRAVENOUS

## 2019-02-13 MED ORDER — AMIODARONE HCL IN DEXTROSE 360-4.14 MG/200ML-% IV SOLN
60.0000 mg/h | INTRAVENOUS | Status: DC
  Administered 2019-02-13 – 2019-02-14 (×2): 60 mg/h via INTRAVENOUS
  Filled 2019-02-13: qty 200

## 2019-02-13 MED ORDER — DEXTROSE 50 % IV SOLN
INTRAVENOUS | Status: AC | PRN
  Administered 2019-02-13: 50 mL via INTRAVENOUS

## 2019-02-13 MED ORDER — SODIUM BICARBONATE 8.4 % IV SOLN
INTRAVENOUS | Status: AC
  Filled 2019-02-13: qty 100

## 2019-02-13 MED ORDER — SODIUM CHLORIDE 0.9% FLUSH
10.0000 mL | Freq: Two times a day (BID) | INTRAVENOUS | Status: DC
  Administered 2019-02-14: 23:00:00 10 mL

## 2019-02-13 MED ORDER — AMIODARONE HCL IN DEXTROSE 360-4.14 MG/200ML-% IV SOLN
INTRAVENOUS | Status: AC
  Filled 2019-02-13: qty 200

## 2019-02-13 MED ORDER — CALCIUM CHLORIDE 10 % IV SOLN
INTRAVENOUS | Status: AC | PRN
  Administered 2019-02-13: 1 g via INTRAVENOUS

## 2019-02-13 MED ORDER — CHLORHEXIDINE GLUCONATE CLOTH 2 % EX PADS
6.0000 | MEDICATED_PAD | Freq: Every day | CUTANEOUS | Status: DC
  Administered 2019-02-14 (×2): 6 via TOPICAL

## 2019-02-13 MED ORDER — AMIODARONE HCL IN DEXTROSE 360-4.14 MG/200ML-% IV SOLN
30.0000 mg/h | INTRAVENOUS | Status: DC
  Administered 2019-02-14: 06:00:00 30 mg/h via INTRAVENOUS

## 2019-02-13 MED ORDER — PANTOPRAZOLE SODIUM 40 MG IV SOLR
40.0000 mg | INTRAVENOUS | Status: DC
  Administered 2019-02-14 (×2): 40 mg via INTRAVENOUS
  Filled 2019-02-13 (×2): qty 40

## 2019-02-13 MED ORDER — AMIODARONE LOAD VIA INFUSION
150.0000 mg | Freq: Once | INTRAVENOUS | Status: DC
  Filled 2019-02-13: qty 83.34

## 2019-02-13 MED ORDER — FENTANYL 2500MCG IN NS 250ML (10MCG/ML) PREMIX INFUSION
25.0000 ug/h | INTRAVENOUS | Status: DC
  Administered 2019-02-13: 25 ug/h via INTRAVENOUS
  Administered 2019-02-14: 20:00:00 50 ug/h via INTRAVENOUS
  Filled 2019-02-13: qty 250

## 2019-02-13 MED ORDER — FENTANYL BOLUS VIA INFUSION
25.0000 ug | INTRAVENOUS | Status: DC | PRN
  Filled 2019-02-13: qty 25

## 2019-02-13 MED ORDER — SODIUM CHLORIDE 0.9% FLUSH: 10.0000 mL | INTRAVENOUS | Status: DC | PRN

## 2019-02-13 MED ORDER — SODIUM BICARBONATE 8.4 % IV SOLN
INTRAVENOUS | Status: AC | PRN
  Administered 2019-02-13: 50 meq via INTRAVENOUS

## 2019-02-13 NOTE — ED Notes (Signed)
Rectal Temp 94.9 F (34.9 C). Dr. Yong Channel informed. MD to inform RN of follow up actions required. To consult Intensivist

## 2019-02-13 NOTE — Progress Notes (Signed)
Patient transported on ventilator from ED Trauma A to CT and then to [id number] with no complications.

## 2019-02-13 NOTE — ED Provider Notes (Signed)
[place] EMERGENCY DEPARTMENT Provider Note   CSN: [id number] Arrival date & time: 02/26/2019  1904     History No chief complaint on file.   Mariah Aguilar is a 86 y.o. female.  HPI 86 year old female with history of CHF, hypertension, history of recent duodenal ulcer perforation, cardiogenic shock, as well as history of hyperkalemia and CKD, presenting to the emergency department for a code STEMI called out in the field.  There is some concern for some inferior ST elevations on outside EKG Per EMS, patient had a witnessed arrest, EMS had 20 minutes of compressions, 4 mg of epi were given, ROSC was obtained.  Epi drip was running on arrival, patient had a ET tube that was supraglottic, I deflated the cuff, and reintubated the patient.  Patient was cool on arrival, hypotensive, restarted on epi drip, initial EKG on arrival showed wide QRS rhythm, concern for hyperkalemia, pick calcium was given, bicarb and glucose was also replenished given initial glucose of 44.    Past Medical History:  Diagnosis Date  . Arthritis   . Cardiomyopathy   . Cecal neoplasm   . CHF (congestive heart failure) ([place])   . Colon polyps   . Diverticulosis   . Hyperlipidemia   . Hypertension   . Insomnia   . Internal hemorrhoids   . Left bundle branch block (LBBB) on electrocardiogram    EKG of 5'12 shows 1st degree AV block/LBBB -Epic.    Patient Active Problem List   Diagnosis Date Noted  . Cardiac arrest ([place]) 03/03/2019  . Perforated bowel ([place]) 12/08/2018  . Hypokalemia 11/26/2018  . Acute respiratory failure with hypoxia ([place]) 11/25/2018  . Acute on chronic combined systolic (congestive) and diastolic (congestive) heart failure ([place]) 11/24/2018  . Hypertensive heart and renal disease 11/01/2018  . Diastolic dysfunction 78/67/6720  . Absolute anemia 11/01/2018  . Acute on chronic diastolic (congestive) heart failure ([place]) 07/28/2018  . GERD (gastroesophageal reflux  disease) 07/28/2018  . Large bowel diverticular disease 03/06/2018  . HLD (hyperlipidemia) 03/06/2018  . HTN (hypertension) 03/06/2018  . Rectal bleed 03/05/2018  . Type 2 diabetes mellitus with stage 2 chronic kidney disease, without long-term current use of insulin ([place]) 12/24/2017  . Chronic renal disease, stage II 12/24/2017  . 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 Estrogen deficiency 12/24/2017  . Fracture of superior pubic ramus ([place]) 05/08/2012  . Inferior pubic ramus fracture ([place]) 05/08/2012  . Osteoarthritis of right knee 08/19/2011  . Scoliosis of lumbar spine 05/21/2011  . Scoliosis of thoracic spine 05/21/2011    Past Surgical History:  Procedure Laterality Date  . ABDOMINAL HYSTERECTOMY    . COLON SURGERY    . COLONOSCOPY  05/27/2011   Procedure: COLONOSCOPY;  Surgeon: Juanita Craver, MD;  Location: [facility];  Service: Endoscopy;  Laterality: N/A;  . COLONOSCOPY WITH PROPOFOL N/A 06/30/2014   Procedure: COLONOSCOPY WITH PROPOFOL;  Surgeon: Juanita Craver, MD;  Location: [facility] END[facility]COPY;  Service: Endoscopy;  Laterality: N/A;     OB History    Gravida  1   Para      Term      Preterm      AB      Living        SAB      TAB      Ectopic      Multiple      Live Births              Family History  Problem Relation Age of Onset  . Cancer Mother        COLON  . Stroke Father   . Diabetes Sister     Social History   Tobacco Use  . Smoking status: Never Smoker  . Smokeless tobacco: Never Used  Substance Use Topics  . Alcohol use: No  . Drug use: No    Home Medications Prior to Admission medications   Medication Sig Start Date End Date Taking? Authorizing Provider  amiodarone (PACERONE) 100 MG tablet Take 1 tablet (100 mg total) by mouth daily. 12/12/18   Swayze, Ava, DO  DEXILANT 60 MG capsule TAKE 1 CAPSULE BY MOUTH EVERY DAY 02/11/2019   Glendale Chard, MD  fluticasone furoate-vilanterol (BREO ELLIPTA) 200-25 MCG/INH AEPB Inhale 1 puff  into the lungs daily. 01/05/19   Minette Brine, FNP  furosemide (LASIX) 40 MG tablet Take 0.5-1 tablets (20-40 mg total) by mouth 2 (two) times daily. Take 5m in the morning and 260mat lunch. Patient taking differently: Take 40 mg by mouth 2 (two) times daily.  11/27/18   Black, KaLezlie OctaveNP  Iron Polysacch Cmplx-B12-FA (NIFEREX-150 FORTE PO) Take 150 mg by mouth daily.     [provider]  potassium chloride (KLOR-CON) 8 MEQ CR tablet Take 8 mEq by mouth daily.      [provider]  sacubitril-valsartan (ENTRESTO) 49-51 MG Take 1 tablet by mouth 2 (two) times daily. 12/13/18   Swayze, Ava, DO  Spacer/Aero-Holding Chambers (AEROCHAMBER PLUS) inhaler Use as instructed 10/08/18   SaGlendale ChardMD  VENTOLIN HFA 108 ([id number]) MCG/ACT inhaler TAKE 2 PUFFS BY MOUTH EVERY 6 HOURS AS NEEDED FOR WHEEZE OR SHORTNESS OF BREATH 01/11/19   SaGlendale ChardMD    Allergies    Shrimp [shellfish allergy] and Penicillins  Review of Systems   Review of Systems  Unable to perform ROS: Acuity of condition    Physical Exam Updated Vital Signs BP 111/74   Pulse 77   Temp (!) 94.9 F (34.9 C) (Rectal)   Resp 11   Ht _0  (1.575 m) Comment: measured by RT  Wt 60.8 kg   SpO2 98%   BMI 24.52 kg/m   Physical Exam Vitals and nursing note reviewed.  Constitutional:      General: She is in acute distress.     Appearance: She is well-developed. She is ill-appearing and toxic-appearing.     Comments: GCS 3T, unresponsive, intubated  HENT:     Head: Normocephalic and atraumatic.     Mouth/Throat:     Comments: ET cuff inflated above the vocal cords  Eyes:     Conjunctiva/sclera: Conjunctivae normal.     Pupils: Pupils are equal, round, and reactive to light.     Comments: 75m68minimally reactive  Cardiovascular:     Rate and Rhythm: Normal rate and regular rhythm.     Heart sounds: No murmur.  Pulmonary:     Effort: No respiratory distress.     Breath sounds: Rhonchi and rales  present.     Comments: Decreased breath sounds bilatearlly  Abdominal:     Palpations: Abdomen is soft.     Tenderness: There is no abdominal tenderness.  Musculoskeletal:        General: Swelling present.     Cervical back: Neck supple.     Right lower leg: Edema present.     Left lower leg: Edema present.  Skin:    General: Skin is warm and dry.  Capillary Refill: Capillary refill takes more than 3 seconds.  Neurological:     Comments: No spont movement GCS 3T Intubated, unresponsive     ED Results / Procedures / Treatments   Labs (all labs ordered are listed, but only abnormal results are displayed) Labs Reviewed  COMPREHENSIVE METABOLIC PANEL - Abnormal; Notable for the following components:      Result Value   CO2 17 (*)    Glucose, Bld 338 (*)    BUN 41 (*)    Creatinine, Ser 2.43 (*)    Total Protein 4.8 (*)    Albumin 2.1 (*)    AST 82 (*)    ALT 47 (*)    Total Bilirubin 1.5 (*)    GFR calc non Af Amer 16 (*)    GFR calc Af Amer 19 (*)    Anion gap 22 (*)    All other components within normal limits  LACTIC ACID, PLASMA - Abnormal; Notable for the following components:   Lactic Acid, Venous >11.0 (*)    All other components within normal limits  PROTIME-INR - Abnormal; Notable for the following components:   Prothrombin Time 23.0 (*)    INR 2.0 (*)    All other components within normal limits  I-STAT CHEM 8, ED - Abnormal; Notable for the following components:   BUN 42 (*)    Creatinine, Ser 2.00 (*)    Glucose, Bld 320 (*)    Calcium, Ion 1.07 (*)    TCO2 16 (*)    Hemoglobin 15.3 (*)    All other components within normal limits  CBG MONITORING, ED - Abnormal; Notable for the following components:   Glucose-Capillary 44 (*)    All other components within normal limits  CBG MONITORING, ED - Abnormal; Notable for the following components:   Glucose-Capillary 42 (*)    All other components within normal limits  POCT I-STAT 7, (LYTES, BLD GAS,  ICA,H+H) - Abnormal; Notable for the following components:   pH, Arterial 7.106 (*)    pCO2 arterial 51.9 (*)    pO2, Arterial 260.0 (*)    Bicarbonate 16.3 (*)    TCO2 18 (*)    Acid-base deficit 13.0 (*)    All other components within normal limits  CBG MONITORING, ED - Abnormal; Notable for the following components:   Glucose-Capillary 157 (*)    All other components within normal limits  CBG MONITORING, ED - Abnormal; Notable for the following components:   Glucose-Capillary 212 (*)    All other components within normal limits  RESPIRATORY PANEL BY RT PCR (FLU A&B, COVID)  CULTURE, BLOOD (ROUTINE X 2)  CULTURE, BLOOD (ROUTINE X 2)  CDS SEROLOGY  ETHANOL  URINALYSIS, ROUTINE W REFLEX MICROSCOPIC  CBC  MAGNESIUM  LACTIC ACID, PLASMA  LACTIC ACID, PLASMA  BRAIN NATRIURETIC PEPTIDE  COMPREHENSIVE METABOLIC PANEL  CBC  URINALYSIS, ROUTINE W REFLEX MICROSCOPIC  BLOOD GAS, ARTERIAL  CBC  BASIC METABOLIC PANEL  BLOOD GAS, ARTERIAL  I-STAT ARTERIAL BLOOD GAS, ED  SAMPLE TO BLOOD BANK  TROPONIN I (HIGH SENSITIVITY)    EKG None  Radiology DG Chest Portable 1 View  Result Date: 02/10/2019 CLINICAL DATA:  Central line placement EXAM: PORTABLE CHEST 1 VIEW COMPARISON:  03/06/2019 FINDINGS: Left central line is been placed. The tip is in the upper right atrium. NG tube and endotracheal tube are unchanged. Cardiomegaly with vascular congestion and possible early interstitial edema. Suspect small effusions. No pneumothorax. IMPRESSION: Left central line placement  with the tip in the upper right atrium. No pneumothorax. Otherwise no change. Electronically Signed   By: Rolm Baptise M.D.   On: 03/04/2019 20:21   DG Chest Port 1 View  Result Date: 02/14/2019 CLINICAL DATA:  Post CPR and intubation EXAM: PORTABLE CHEST 1 VIEW COMPARISON:  Twelve 2 FINDINGS: Endotracheal tube is 4.8 cm above the carina. NG tube enters the stomach. Cardiomegaly, vascular congestion. Suspect small bilateral  effusions. Early interstitial perihilar prominence could reflect interstitial edema. No pneumothorax. No acute bony abnormality. IMPRESSION: Cardiomegaly with vascular congestion and possible perihilar interstitial edema. Electronically Signed   By: Rolm Baptise M.D.   On: 02/12/2019 19:49    Procedures Procedure Name: Intubation Date/Time: 02/22/2019 7:40 PM Performed by: Kizzie Fantasia, MD Pre-anesthesia Checklist: Emergency Drugs available, Suction available and Timeout performed Oxygen Delivery Method: Ambu bag Preoxygenation: Pre-oxygenation with 100% oxygen Laryngoscope Size: Mac and 4 Grade View: Grade I Tube size: 7.0 mm Number of attempts: 1 Airway Equipment and Method: Video-laryngoscopy and Rigid stylet Placement Confirmation: ETT inserted through vocal cords under direct vision,  Positive ETCO2 and CO2 detector Secured at: 22 cm Tube secured with: ETT holder Dental Injury: Bloody posterior oropharynx  Comments: Patient arrived intubated, however ET tube superior to glottic opening, patient was then re-intubated    .Central Line  Date/Time: 02/20/2019 10:42 PM Performed by: Kizzie Fantasia, MD Authorized by: Varney Biles, MD   Consent:    Consent obtained:  Emergent situation Universal protocol:    Test results available and properly labeled: yes     Imaging studies available: yes     Required blood products, implants, devices, and special equipment available: yes     Site/side marked: yes     Immediately prior to procedure, a time out was called: yes     Patient identity confirmed:  Hospital-assigned identification number, provided demographic data and arm band Pre-procedure details:    Hand hygiene: Hand hygiene performed prior to insertion     Sterile barrier technique: All elements of maximal sterile technique followed     Skin preparation:  2% chlorhexidine and ChloraPrep   Skin preparation agent: Skin preparation agent completely dried prior to procedure     Sedation:    Sedation type: patient intubated. Anesthesia (see MAR for exact dosages):    Anesthesia method:  None (unresponsive, intubated) Procedure details:    Location:  L internal jugular   Patient position:  Flat   Procedural supplies:  Triple lumen   Catheter size:  7 Fr   Landmarks identified: yes     Ultrasound guidance: yes     Sterile ultrasound techniques: Sterile gel and sterile probe covers were used     Number of attempts:  1   Successful placement: yes   Post-procedure details:    Post-procedure:  Dressing applied and line sutured   Assessment:  Blood return through all ports, no pneumothorax on x-ray, placement verified by x-ray and free fluid flow   Patient tolerance of procedure:  Tolerated well, no immediate complications ARTERIAL LINE  Date/Time: 02/08/2019 10:45 PM Performed by: Kizzie Fantasia, MD Authorized by: Varney Biles, MD   Consent:    Consent obtained:  Emergent situation Indications:    Indications: hemodynamic monitoring and multiple ABGs   Pre-procedure details:    Skin preparation:  2% Chlorhexidine   Preparation: Patient was prepped and draped in sterile fashion   Anesthesia (see MAR for exact dosages):    Anesthesia method:  None Procedure  details:    Location:  L radial   Allen's test performed: yes     Allen's test abnormal: yes     Needle gauge:  18 G   Placement technique:  Seldinger   Number of attempts:  1   Transducer: waveform confirmed   Post-procedure details:    Post-procedure:  Biopatch applied, secured with tape, sterile dressing applied and wrist guard applied   CMS:  Normal   Patient tolerance of procedure:  Tolerated well, no immediate complications   (including critical care time)  Medications Ordered in ED Medications  dextrose 50 % solution (  Not Given 03/05/2019 2015)  dextrose 10 % infusion ( Intravenous Transfusing/Transfer 02/25/2019 2250)  sodium chloride flush (NS) 0.9 % injection 10-40 mL (10 mLs  Intracatheter Not Given 02/22/2019 2115)  sodium chloride flush (NS) 0.9 % injection 10-40 mL (has no administration in time range)  Chlorhexidine Gluconate Cloth 2 % PADS 6 each (has no administration in time range)  norepinephrine (LEVOPHED) 47m in 2560mpremix infusion (0 mcg/min Intravenous Transfusing/Transfer 03/02/2019 2250)  fentaNYL 250065min NS 250m87m0mc10m) infusion-PREMIX (25 mcg/hr Intravenous Transfusing/Transfer 03/03/2019 2250)  fentaNYL (SUBLIMAZE) bolus via infusion 25 mcg (has no administration in time range)  aspirin EC tablet 325 mg (has no administration in time range)  pantoprazole (PROTONIX) injection 40 mg (has no administration in time range)  amiodarone (NEXTERONE) IV bolus only 150 mg/100 mL (has no administration in time range)  calcium chloride injection (1 g Intravenous Given 02/18/2019 1924)  sodium bicarbonate injection (50 mEq Intravenous Given 02/09/2019 1926)  dextrose 50 % solution (50 mLs Intravenous Given 02/12/2019 1929)  lactated ringers bolus 500 mL (0 mLs Intravenous Stopped 02/09/2019 2227)  fentaNYL (SUBLIMAZE) injection 25 mcg (25 mcg Intravenous Given 02/19/2019 2226)    ED Course  I have reviewed the triage vital signs and the nursing notes.  Pertinent labs & imaging results that were available during my care of the patient were reviewed by me and considered in my medical decision making (see chart for details).    MDM Rules/Calculators/A&P                      89 ye3 old female presenting to the emergency department status post cardiac arrest, per report via EMS, patient had a witnessed arrest, on their arrival patient was in PEA rhythm.  ACLS protocol was followed for approximately 20 minutes before arrival, patient was given 4 doses of epinephrine, ROSC was achieved prior to arrival.  Patient was intubated in the field, however on arrival lung sounds were diminished bilaterally, using the video laryngoscope, I noticed that the ET tube was above the glottic opening.   Patient was reintubated in the ED, see procedure note.  On arrival patient had pulses, was hypotensive, epinephrine drip was restarted, initial EKG was concerning for hyperkalemia with a widened QRS, calcium gluconate was given, amp of bicarb was given as well.  Bedside echo was performed, with a small pericardial effusion noted, no tamponade physiology was demonstrated on my exam, RV remained noncollapsible.  Unsure etiology of the effusion, could be secondary to kidney failure, uremia, malignancy, volume overload.  Patient's chest x-ray with diffuse multifocal opacities, likely secondary to interstitial edema and decompensated heart failure.  Patient in cardiogenic shock.  Patient afebrile, no recent illnesses, doubt sepsis at this time however blood cultures were also obtained.  Initial ABG with a pH of 7.1, lactic acid greater than 11.  Potassium normal, kidney function elevated,  creatinine elevated, AKI.  Left IJ central line was placed, chest x-ray confirmed, left radial art line was placed as well, see procedure notes.  Epinephrine drip was switched to Levophed.  Fentanyl drip started for sedation, patient had no meaningful neurological activity, however did demonstrate some myoclonic jerking during her ED stay.  Patient was not opening eyes, pupils were 3 mm, minimally reactive bilaterally.  Unsure degree of neurological insult to her brain, likely substantial anoxic brain injury given her time down.  I had extensive conversations with the family regarding CODE STATUS, patient's prognosis as well as the critical condition the patient is in, given her age and comorbidities.  Family continued to want to make her full code.  I reviewed all labs and imaging, as well as her previous hospital stays in the epic system.  Patient was admitted to the ICU, stable yet very tenuous condition.  The attending physician was present and available for all medical decision making and procedures related to this patient's  care.  Final Clinical Impression(s) / ED Diagnoses Final diagnoses:  PEA (Pulseless electrical activity) ([place])  Cardiac arrest ([place])  Shock ([employer])  Acute respiratory failure with hypoxia [facility])    Rx / DC Orders ED Discharge Orders    None       Kizzie Fantasia, MD 03/03/2019 2253    Varney Biles, MD 02/14/19 [phone]

## 2019-02-13 NOTE — ED Triage Notes (Signed)
Pt BIB [facility] from home d/t witnessed cardiac arrest. EMS 20 min CPR, 4 epi given. ROSC obtained.  Epi Drip running on arrival.

## 2019-02-13 NOTE — ED Notes (Signed)
Pt placed on ZOLL pads 

## 2019-02-13 NOTE — ED Notes (Signed)
RT called for A line set up per Dr. Yong Channel request

## 2019-02-13 NOTE — ED Notes (Signed)
Cardiology at bedside Cancelled CODE STEMI

## 2019-02-13 NOTE — ED Notes (Signed)
Dr. Yong Channel updated in regards to CBG 212. Verbal Rate dose change D10 25 mL.

## 2019-02-13 NOTE — Procedures (Signed)
Arterial Catheter Insertion Procedure Note Jazira LATOYIA ORIS [id number] 1922/04/07  Procedure: Insertion of Arterial Catheter  Indications: Blood pressure monitoring and Frequent blood sampling  Procedure Details Consent: Risks of procedure as well as the alternatives and risks of each were explained to the (patient/caregiver).  Consent for procedure obtained. Time Out: Verified patient identification, verified procedure, site/side was marked, verified correct patient position, special equipment/implants available, medications/allergies/relevent history reviewed, required imaging and test results available.  Performed  Maximum sterile technique was used including antiseptics, cap, gloves, gown, hand hygiene, mask and sheet. Skin prep: Chlorhexidine; local anesthetic administered 20 gauge catheter was inserted into left radial artery using the Seldinger technique. ULTRASOUND GUIDANCE USED: NO Evaluation Blood flow good; BP tracing good. Complications: No apparent complications.   Alvera Singh 03/04/2019

## 2019-02-13 NOTE — H&P (Signed)
NAME:  Mariah Aguilar, MRN:  [id number], DOB:  06-13-1922, LOS: 0 ADMISSION DATE:  02/16/2019, CONSULTATION DATE:  03/02/2019 REFERRING MD:  Mariah Aguilar, CHIEF COMPLAINT:  PEA arrest   Brief History   86 year old female with significant cardiac history including dilated cardiomyopathy with an EF of 20 to 25% and paroxysmal atrial fibrillation who had a witnessed PEA arrest at home with 20 minutes of CPR by EMS, ROSC obtained and patient was intubated in the ED.  Initial concern for STEMI, but EKG was reviewed by cardiology and code STEMI discontinued. Family wants full code and aggressive care.  PCCM consulted for admission  History of present illness   Mariah Aguilar is a 86 y.o. F with past medical history of dilated cardiomyopathy, HFrEF, severe mitral regurgitation, hyperlipidemia, CKD, hypertension, atrial fibrillation not on anticoagulation secondary to GI bleed who had a witnessed PEA arrest at home with 20 minutes of CPR by EMS, ROSC obtained and patient was intubated in the ED.  Initial concern for STEMI, but EKG was reviewed by cardiology and code STEMI discontinued.  Initial temperature was 89.6 Fahrenheit, work-up revealed glucose of 44, creatinine 2.4 with baseline ~1.0, lactic acid greater than 11.0 and negative Covid-19.  She was initially on an epinephrine drip, this was changed to Levophed and PCCM consulted for admission.  ED physician spoke with granddaughter, who wants full aggressive care.   Further history obtained from patient's granddaughter Mariah Aguilar whose family takes care of patient around-the-clock.  She reports that up until November 2020 patient was highly functional and independent, she was then hospitalized with a perforated ulcer and since then has been slowly declining with difficulty walking, some cognitive decline especially at night and worsening lower extremity swelling.  At this time she does want to continue full code, but as she would not want a prolonged code situation.    Past Medical History   has a past medical history of Arthritis, Cardiomyopathy, Cecal neoplasm, CHF (congestive heart failure) ([place]), Colon polyps, Diverticulosis, Hyperlipidemia, Hypertension, Insomnia, Internal hemorrhoids, and Left bundle branch block (LBBB) on electrocardiogram.   Significant Hospital Events   2/6, admit to PCCM  Consults:    Procedures:  2/6-ETT 2/6-central line placed by EDP 2/6-left radial art line  Significant Diagnostic Tests:  2/6 CXR>> cardiomegaly with vascular congestion 2/6 CT head>> pending  Micro Data:  2/6 SARS-Cov-2>> negative 2/6 blood cultures x2>> 2/6 urine culture>>  Antimicrobials:    Interim history/subjective:  Patient minimally responsive off sedation in the ED  Objective   Blood pressure 101/73, pulse (!) 37, temperature (!) 94.9 F (34.9 C), temperature source Rectal, resp. rate 14, height 5\' 2"  (1.575 m), weight 60.8 kg, SpO2 98 %.    Vent Mode: PRVC FiO2 (%):  [100 %] 100 % Set Rate:  [18 bmp] 18 bmp Vt Set:  [400 mL] 400 mL PEEP:  [5 cmH20] 5 cmH20 Plateau Pressure:  [17 cmH20] 17 cmH20  No intake or output data in the 24 hours ending 02/14/2019 2211 Filed Weights   02/20/2019 1915  Weight: 60.8 kg    General: Elderly female, intubated and normally responsive HEENT: MM pink/moist, ETT in place Neuro: Grimaces to pain, otherwise unresponsive, pupils equal and responsive, breathing over vent CV: s1s2 in and out of irregular rhythm, no m/r/g PULM: Bilateral rhonchi GI: soft, bsx4 active  Extremities: warm/dry, pitting edema bilaterally left>>r edema  Skin: no rashes or lesions   Resolved Hospital Problem list     Assessment & Plan:  PEA arrest with history of HFrEF and dilated cardiomyopathy with severe mitral regurgitation and resultant cardiogenic shock -EF in 2019 20 to 25% P: -Not a candidate for cooling secondary to initial hypothermia -Lactic acid greater than 11 and remains on Levophed, trend lactic  and maintain MAP greater than 65 -Maintain normothermia -Admit to intensive care, obtain CT head, echocardiogram and bilateral venous Dopplers -Start aspirin and trend troponin -Consider heparin, hold tonight pending head CT and troponins -Hold home Entresto, may need cardiology consult pending patient's course -Also hold home Lasix for now, appears fairly euvolemic -Poor neurologic exam, discussed poor prognosis with patient's granddaughter who wants full code for now, though would only want "one round of CPR" -Obtain EEG -Fentanyl if needed for sedation -Maintain full vent support with SAT/SBT as tolerated -titrate Vent setting to maintain SpO2 greater than or equal to 90%. -HOB elevated 30 degrees. -Plateau pressures less than 30 cm H20.  -Follow chest x-ray, ABG prn.   -Bronchial hygiene and RT/bronchodilator protocol.    Encephalopathy -Baseline alert and oriented except for some sundowning per granddaughter P: -Check stat head CT, concern for hypoxic anoxic brain injury   Paroxysmal atrial fibrillation -In and out of sinus in the ED P: -Start amiodarone gtt, consider heparin though high risk given history of significant GI bleed   Acute renal insufficiency -Creatinine greater than 2 with baseline of around 1, likely related to cardiac arrest and shock P: -Monitor urine output and creatinine, avoid nephrotoxins and follow electrolytes       Best practice:  Diet: N.p.o. Pain/Anxiety/Delirium protocol (if indicated): Fentanyl VAP protocol (if indicated): Yes DVT prophylaxis: SCDs GI prophylaxis: Protonix Glucose control: SSI Mobility: Bedrest Code Status: Full code Family Communication: Patient's daughter Mariah Aguilar updated Disposition: ICU  Labs   CBC: Recent Labs  Lab 02/27/2019 1952 03/03/2019 2019  HGB 14.6 15.3*  HCT 43.0 0000000    Basic Metabolic Panel: Recent Labs  Lab 03/07/2019 1952 02/25/2019 2007 03/01/2019 2019  NA 136 140 137  K 4.7 5.1 4.9  CL   --  101 105  CO2  --  17*  --   GLUCOSE  --  338* 320*  BUN  --  41* 42*  CREATININE  --  2.43* 2.00*  CALCIUM  --  9.3  --    GFR: Estimated Creatinine Clearance: 14.8 mL/min (A) (by C-G formula based on SCr of 2 mg/dL (H)). Recent Labs  Lab 02/28/2019 2007  LATICACIDVEN >11.0*    Liver Function Tests: Recent Labs  Lab 03/04/2019 2007  AST 82*  ALT 47*  ALKPHOS 58  BILITOT 1.5*  PROT 4.8*  ALBUMIN 2.1*   No results for input(s): LIPASE, AMYLASE in the last 168 hours. No results for input(s): AMMONIA in the last 168 hours.  ABG    Component Value Date/Time   PHART 7.106 (LL) 02/11/2019 1952   PCO2ART 51.9 (H) 03/06/2019 1952   PO2ART 260.0 (H) 02/12/2019 1952   HCO3 16.3 (L) 02/16/2019 1952   TCO2 16 (L) 03/05/2019 2019   ACIDBASEDEF 13.0 (H) 02/12/2019 1952   O2SAT 100.0 02/24/2019 1952     Coagulation Profile: Recent Labs  Lab 02/21/2019 2007  INR 2.0*    Cardiac Enzymes: No results for input(s): CKTOTAL, CKMB, CKMBINDEX, TROPONINI in the last 168 hours.  HbA1C: Hgb A1c MFr Bld  Date/Time Value Ref Range Status  10/08/2018 03:26 PM 6.1 (H) 4.8 - 5.6 % Final    Comment:  Prediabetes: 5.7 - 6.4          Diabetes: >6.4          Glycemic control for adults with diabetes: <7.0   05/19/2018 12:45 PM 6.5 (H) 4.8 - 5.6 % Final    Comment:             Prediabetes: 5.7 - 6.4          Diabetes: >6.4          Glycemic control for adults with diabetes: <7.0     CBG: Recent Labs  Lab 02/17/2019 1922 02/20/2019 1943 03/04/2019 2034 02/12/2019 2149  GLUCAP 76* 17* 157* 212*    Review of Systems:   Unable to obtain secondary to encephalopathy  Past Medical History  She,  has a past medical history of Arthritis, Cardiomyopathy, Cecal neoplasm, CHF (congestive heart failure) ([place]), Colon polyps, Diverticulosis, Hyperlipidemia, Hypertension, Insomnia, Internal hemorrhoids, and Left bundle branch block (LBBB) on electrocardiogram.   Surgical History     Past Surgical History:  Procedure Laterality Date  . ABDOMINAL HYSTERECTOMY    . COLON SURGERY    . COLONOSCOPY  05/27/2011   Procedure: COLONOSCOPY;  Surgeon: Juanita Craver, MD;  Location: [facility];  Service: Endoscopy;  Laterality: N/A;  . COLONOSCOPY WITH PROPOFOL N/A 06/30/2014   Procedure: COLONOSCOPY WITH PROPOFOL;  Surgeon: Juanita Craver, MD;  Location: [facility]COPY;  Service: Endoscopy;  Laterality: N/A;     Social History   reports that she has never smoked. She has never used smokeless tobacco. She reports that she does not drink alcohol or use drugs.   Family History   Her family history includes Cancer in her mother; Diabetes in her sister; Stroke in her father.   Allergies Allergies  Allergen Reactions  . Shrimp [Shellfish Allergy] Itching and Swelling  . Penicillins Hives and Rash    Did it involve swelling of the face/tongue/throat, SOB, or low BP? Unknown Did it involve sudden or severe rash/hives, skin peeling, or any reaction on the inside of your mouth or nose? Unknown Did you need to seek medical attention at a hospital or doctor's office? Unknown When did it last happen? If all above answers are "NO", may proceed with cephalosporin use.     Home Medications  Prior to Admission medications   Medication Sig Start Date End Date Taking? Authorizing Provider  amiodarone (PACERONE) 100 MG tablet Take 1 tablet (100 mg total) by mouth daily. 12/12/18   Swayze, Ava, DO  DEXILANT 60 MG capsule TAKE 1 CAPSULE BY MOUTH EVERY DAY 02/27/2019   Glendale Chard, MD  fluticasone furoate-vilanterol (BREO ELLIPTA) 200-25 MCG/INH AEPB Inhale 1 puff into the lungs daily. 01/05/19   Minette Brine, FNP  furosemide (LASIX) 40 MG tablet Take 0.5-1 tablets (20-40 mg total) by mouth 2 (two) times daily. Take 40mg  in the morning and 20mg  at lunch. Patient taking differently: Take 40 mg by mouth 2 (two) times daily.  11/27/18   Black, Lezlie Octave, NP  Iron Polysacch Cmplx-B12-FA  (NIFEREX-150 FORTE PO) Take 150 mg by mouth daily.     [provider]  potassium chloride (KLOR-CON) 8 MEQ CR tablet Take 8 mEq by mouth daily.      [provider]  sacubitril-valsartan (ENTRESTO) 49-51 MG Take 1 tablet by mouth 2 (two) times daily. 12/13/18   Swayze, Ava, DO  Spacer/Aero-Holding Chambers (AEROCHAMBER PLUS) inhaler Use as instructed 10/08/18   Glendale Chard, MD  VENTOLIN HFA 108 [phone] Base) MCG/ACT inhaler TAKE  2 PUFFS BY MOUTH EVERY 6 HOURS AS NEEDED FOR WHEEZE OR SHORTNESS OF BREATH 01/11/19   Glendale Chard, MD     Critical care time: 65 minutes       CRITICAL CARE Performed by: Otilio Carpen Marlissa Emerick   Total critical care time: 65 minutes  Critical care time was exclusive of separately billable procedures and treating other patients.  Critical care was necessary to treat or prevent imminent or life-threatening deterioration.  Critical care was time spent personally by me on the following activities: development of treatment plan with patient and/or surrogate as well as nursing, discussions with consultants, evaluation of patient's response to treatment, examination of patient, obtaining history from patient or surrogate, ordering and performing treatments and interventions, ordering and review of laboratory studies, ordering and review of radiographic studies, pulse oximetry and re-evaluation of patient's condition.   Otilio Carpen Bleu Moisan, PA-C

## 2019-02-13 NOTE — Progress Notes (Signed)
eLink Physician-Brief Progress Note Patient Name: Damali ELAURA NACHTMAN DOB: [birth year] MRN: [id number]   Date of Service  03/02/2019  HPI/Events of Note  Called to bedside of this patient for worsening hypotension. She is newly admitted to the ICU from the ED after a cardiac arrest (out-of-hospital PEA arrest s/p ROSC after 20 minutes).  Arterial line showed a MAP in 40s despite being on levophed at 40 mcg/min. There was significant ectopy on telemetry.  On the basis of her clinical history and prior labs, acidosis was suspected as the cause of her worsening hypotension.   eICU Interventions  ABG STAT.  Empirically give 2x amps of sodium bicarbonate for acidosis.  Ordered epinephrine drip.  I asked the ground team to come to bedside to assume in-person management of the situation. They are now present.     Intervention Category Major Interventions: Hypotension - evaluation and management;Acid-Base disturbance - evaluation and management  Marily Lente Gwen Edler 02/12/2019, 11:43 PM

## 2019-02-14 ENCOUNTER — Inpatient Hospital Stay (HOSPITAL_COMMUNITY): Payer: Medicare Other

## 2019-02-14 DIAGNOSIS — R570 Cardiogenic shock: Secondary | ICD-10-CM

## 2019-02-14 DIAGNOSIS — I462 Cardiac arrest due to underlying cardiac condition: Secondary | ICD-10-CM

## 2019-02-14 DIAGNOSIS — G931 Anoxic brain damage, not elsewhere classified: Secondary | ICD-10-CM

## 2019-02-14 DIAGNOSIS — R609 Edema, unspecified: Secondary | ICD-10-CM

## 2019-02-14 DIAGNOSIS — I469 Cardiac arrest, cause unspecified: Secondary | ICD-10-CM

## 2019-02-14 DIAGNOSIS — R579 Shock, unspecified: Secondary | ICD-10-CM

## 2019-02-14 DIAGNOSIS — I480 Paroxysmal atrial fibrillation: Secondary | ICD-10-CM

## 2019-02-14 LAB — CBC
HCT: 42.1 % (ref 36.0–46.0)
HCT: 43.9 % (ref 36.0–46.0)
Hemoglobin: 13.2 g/dL (ref 12.0–15.0)
Hemoglobin: 13.9 g/dL (ref 12.0–15.0)
MCH: 26.8 pg (ref 26.0–34.0)
MCH: 26.9 pg (ref 26.0–34.0)
MCHC: 31.4 g/dL (ref 30.0–36.0)
MCHC: 31.7 g/dL (ref 30.0–36.0)
MCV: 84.9 fL (ref 80.0–100.0)
MCV: 85.6 fL (ref 80.0–100.0)
Platelets: 249 10*3/uL (ref 150–400)
Platelets: 260 10*3/uL (ref 150–400)
RBC: 4.92 MIL/uL (ref 3.87–5.11)
RBC: 5.17 MIL/uL — ABNORMAL HIGH (ref 3.87–5.11)
RDW: 20.7 % — ABNORMAL HIGH (ref 11.5–15.5)
RDW: 20.8 % — ABNORMAL HIGH (ref 11.5–15.5)
WBC: 10.8 10*3/uL — ABNORMAL HIGH (ref 4.0–10.5)
WBC: 12.7 10*3/uL — ABNORMAL HIGH (ref 4.0–10.5)
nRBC: 0 % (ref 0.0–0.2)
nRBC: 0.2 % (ref 0.0–0.2)

## 2019-02-14 LAB — COMPREHENSIVE METABOLIC PANEL
ALT: 62 U/L — ABNORMAL HIGH (ref 0–44)
AST: 121 U/L — ABNORMAL HIGH (ref 15–41)
Albumin: 2.1 g/dL — ABNORMAL LOW (ref 3.5–5.0)
Alkaline Phosphatase: 63 U/L (ref 38–126)
Anion gap: 20 — ABNORMAL HIGH (ref 5–15)
BUN: 42 mg/dL — ABNORMAL HIGH (ref 8–23)
CO2: 23 mmol/L (ref 22–32)
Calcium: 8 mg/dL — ABNORMAL LOW (ref 8.9–10.3)
Chloride: 101 mmol/L (ref 98–111)
Creatinine, Ser: 2.2 mg/dL — ABNORMAL HIGH (ref 0.44–1.00)
GFR calc Af Amer: 22 mL/min — ABNORMAL LOW (ref >60)
GFR calc non Af Amer: 19 mL/min — ABNORMAL LOW (ref >60)
Glucose, Bld: 323 mg/dL — ABNORMAL HIGH (ref 70–99)
Potassium: 4.2 mmol/L (ref 3.5–5.1)
Sodium: 144 mmol/L (ref 135–145)
Total Bilirubin: 1.5 mg/dL — ABNORMAL HIGH (ref 0.3–1.2)
Total Protein: 4.8 g/dL — ABNORMAL LOW (ref 6.5–8.1)

## 2019-02-14 LAB — BASIC METABOLIC PANEL
Anion gap: 17 — ABNORMAL HIGH (ref 5–15)
BUN: 43 mg/dL — ABNORMAL HIGH (ref 8–23)
CO2: 20 mmol/L — ABNORMAL LOW (ref 22–32)
Calcium: 7.9 mg/dL — ABNORMAL LOW (ref 8.9–10.3)
Chloride: 103 mmol/L (ref 98–111)
Creatinine, Ser: 2.29 mg/dL — ABNORMAL HIGH (ref 0.44–1.00)
GFR calc Af Amer: 21 mL/min — ABNORMAL LOW (ref >60)
GFR calc non Af Amer: 18 mL/min — ABNORMAL LOW (ref >60)
Glucose, Bld: 252 mg/dL — ABNORMAL HIGH (ref 70–99)
Potassium: 4.3 mmol/L (ref 3.5–5.1)
Sodium: 140 mmol/L (ref 135–145)

## 2019-02-14 LAB — GLUCOSE, CAPILLARY
Glucose-Capillary: 111 mg/dL — ABNORMAL HIGH (ref 70–99)
Glucose-Capillary: 118 mg/dL — ABNORMAL HIGH (ref 70–99)
Glucose-Capillary: 156 mg/dL — ABNORMAL HIGH (ref 70–99)
Glucose-Capillary: 221 mg/dL — ABNORMAL HIGH (ref 70–99)
Glucose-Capillary: 277 mg/dL — ABNORMAL HIGH (ref 70–99)
Glucose-Capillary: 78 mg/dL (ref 70–99)

## 2019-02-14 LAB — POCT I-STAT 7, (LYTES, BLD GAS, ICA,H+H)
Acid-base deficit: 3 mmol/L — ABNORMAL HIGH (ref 0.0–2.0)
Bicarbonate: 21 mmol/L (ref 20.0–28.0)
Calcium, Ion: 1.06 mmol/L — ABNORMAL LOW (ref 1.15–1.40)
HCT: 46 % (ref 36.0–46.0)
Hemoglobin: 15.6 g/dL — ABNORMAL HIGH (ref 12.0–15.0)
O2 Saturation: 98 %
Patient temperature: 95.5
Potassium: 4.1 mmol/L (ref 3.5–5.1)
Sodium: 139 mmol/L (ref 135–145)
TCO2: 22 mmol/L (ref 22–32)
pCO2 arterial: 33 mmHg (ref 32.0–48.0)
pH, Arterial: 7.404 (ref 7.350–7.450)
pO2, Arterial: 106 mmHg (ref 83.0–108.0)

## 2019-02-14 LAB — LACTIC ACID, PLASMA
Lactic Acid, Venous: 8.4 mmol/L (ref 0.5–1.9)
Lactic Acid, Venous: 7.8 mmol/L (ref 0.5–1.9)

## 2019-02-14 LAB — MRSA PCR SCREENING: MRSA by PCR: NEGATIVE

## 2019-02-14 LAB — TROPONIN I (HIGH SENSITIVITY)
Troponin I (High Sensitivity): 1805 ng/L (ref <18)
Troponin I (High Sensitivity): 2265 ng/L (ref <18)

## 2019-02-14 LAB — ECHOCARDIOGRAM COMPLETE
Height: 62 in
Weight: 2447.99 oz

## 2019-02-14 LAB — BRAIN NATRIURETIC PEPTIDE: B Natriuretic Peptide: 3403.6 pg/mL — ABNORMAL HIGH (ref 0.0–100.0)

## 2019-02-14 LAB — MAGNESIUM: Magnesium: 2 mg/dL (ref 1.7–2.4)

## 2019-02-14 MED ORDER — SODIUM BICARBONATE 8.4 % IV SOLN
50.0000 meq | Freq: Once | INTRAVENOUS | Status: AC
  Administered 2019-02-13: 50 meq via INTRAVENOUS

## 2019-02-14 MED ORDER — ACETAMINOPHEN 160 MG/5ML PO SOLN
650.0000 mg | Freq: Once | ORAL | Status: AC
  Administered 2019-02-14: 650 mg
  Filled 2019-02-14: qty 20.3

## 2019-02-14 MED ORDER — CHLORHEXIDINE GLUCONATE 0.12% ORAL RINSE (MEDLINE KIT)
15.0000 mL | Freq: Two times a day (BID) | OROMUCOSAL | Status: DC
  Administered 2019-02-14 – 2019-02-15 (×3): 15 mL via OROMUCOSAL

## 2019-02-14 MED ORDER — MUPIROCIN 2 % EX OINT
1.0000 "application " | TOPICAL_OINTMENT | Freq: Two times a day (BID) | CUTANEOUS | Status: DC
  Administered 2019-02-14 (×3): 1 via NASAL
  Filled 2019-02-14 (×2): qty 22

## 2019-02-14 MED ORDER — ORAL CARE MOUTH RINSE
15.0000 mL | OROMUCOSAL | Status: DC
  Administered 2019-02-14 – 2019-02-15 (×12): 15 mL via OROMUCOSAL

## 2019-02-14 MED ORDER — AMIODARONE HCL 200 MG PO TABS
100.0000 mg | ORAL_TABLET | Freq: Every day | ORAL | Status: DC
  Administered 2019-02-14: 11:00:00 100 mg
  Filled 2019-02-14: qty 1

## 2019-02-14 NOTE — Progress Notes (Signed)
Patient is maxed on Levophed at this point with pressures in 120's/50's with HR between 30s and 60s. Sedation has been off. E-link notified. RN called family for updates about the condition of Mariah Aguilar. They will call and notify RN if they are able to come and be present at the beside.

## 2019-02-14 NOTE — Procedures (Signed)
ELECTROENCEPHALOGRAM REPORT   Patient: Mariah Aguilar       Room #: [id number] EEG No. ID: [id number] Age: 86 y.o.        Sex: female Requesting Physician: Mariane Masters Report Date:  02/14/2019        Interpreting Physician: Alexis Goodell  History: Mariah Aguilar is an 86 y.o. female s/p PEA arrest  Medications:  Levophed, ASA, Amiodarone,   Conditions of Recording:  This is a 21 channel routine scalp EEG performed with bipolar and monopolar montages arranged in accordance to the international 10/20 system of electrode placement. One channel was dedicated to EKG recording.  The patient is in the intubated and poorly responsive state.  Description:  The background activity is discontinuous.  It consists of of bursts of polyspike, spike and slow activity alternating with periods of attenuation. The burst activity lasts from 2-4 seconds and is generalized.  The periods of attenuation last up to 40 seconds. This discontinuous activity is maintained throughout the tracing.  Painful stimulation is applied and although the patient does appear to withdraw from painful stimuli clinically, there is no activation of the background rhythm noted.   Hyperventilation and intermittent photic stimulation were not performed.   IMPRESSION: This is an abnormal EEG due to a burst-suppression pattern seen throughout the tracing.  Burst suppression pattern can be seen in a variety of circumstances, including anesthesia, drug intoxication, hypothermia, as well as cerebral anoxia.  Clinical/neurological, and radiographic correlation advised.     Alexis Goodell, MD Neurology [phone] 02/14/2019, 1:02 PM

## 2019-02-14 NOTE — Progress Notes (Signed)
Bilateral lower extremity venous duplex complete.  Please see CV Proc tab for preliminary results.  Critical findings discussed with RN and text paged to Dr. Mariane Masters.  Lita Mains- RDMS, RVT 11:06 AM  02/14/2019

## 2019-02-14 NOTE — Progress Notes (Signed)
[place] Progress Note Patient Name: Mariah Aguilar DOB: 1922-02-01 MRN: [id number]   Date of Service  02/14/2019  HPI/Events of Note  RN informs me of lactic acid of 8.4 and troponin of 1805.  Lactate is downtrending. Troponin is rising as expected in post-arrest state.   eICU Interventions  There is a repeat troponin already ordered.  Next lactic acid will be ordered to be drawn with AM labs.     Intervention Category Minor Interventions: Other:  Charlott Rakes 02/14/2019, 2:29 AM

## 2019-02-14 NOTE — ED Provider Notes (Signed)
Mariah Aguilar 29M MEDICAL ICU Provider Note   CSN: [id number] Arrival date & time: 03/02/2019  1904     History No chief complaint on file.   Mariah Aguilar is a 86 y.o. female.  HPI     Level 5 caveat for unresponsive patient.  86 year old female with history of cardiomyopathy with advanced CHF comes into the ER with chief complaint of cardiac arrest.  Per EMS, patient had a witnessed arrest at her home.  EMS arrived and patient was noted to be pulseless and CPR was initiated.  She had received 4 rounds of epinephrine prior to ED arrival with CPR for 20 to 25 minutes.  Patient arrives to the ER hypotensive and on epinephrine drip.   I discussed the case with patient's family.  Patient is full code for now.  They informed me that patient has been gradually declining over the last several weeks with her CHF.  She had no chest pain, shortness of breath, fevers or illnesses prior to today's decompensation.  Past Medical History:  Diagnosis Date  . Arthritis   . Cardiomyopathy   . Cecal neoplasm   . CHF (congestive heart failure) ([place])   . Colon polyps   . Diverticulosis   . Hyperlipidemia   . Hypertension   . Insomnia   . Internal hemorrhoids   . Left bundle branch block (LBBB) on electrocardiogram    EKG of 5'12 shows 1st degree AV block/LBBB -Epic.    Patient Active Problem List   Diagnosis Date Noted  . Shock ([place])   . PEA (Pulseless electrical activity) ([place]) 03/05/2019  . Perforated bowel ([place]) 12/08/2018  . Hypokalemia 11/26/2018  . Acute respiratory failure with hypoxia ([place]) 11/25/2018  . Acute on chronic combined systolic (congestive) and diastolic (congestive) heart failure ([place]) 11/24/2018  . Hypertensive heart and renal disease 11/01/2018  . Diastolic dysfunction 69/48/5462  . Absolute anemia 11/01/2018  . Acute on chronic diastolic (congestive) heart failure ([place]) 07/28/2018  . GERD (gastroesophageal reflux disease) 07/28/2018  . Large  bowel diverticular disease 03/06/2018  . HLD (hyperlipidemia) 03/06/2018  . HTN (hypertension) 03/06/2018  . Rectal bleed 03/05/2018  . Type 2 diabetes mellitus with stage 2 chronic kidney disease, without long-term current use of insulin ([place]) 12/24/2017  . Chronic renal disease, stage II 12/24/2017  . 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 Estrogen deficiency 12/24/2017  . Fracture of superior pubic ramus ([place]) 05/08/2012  . Inferior pubic ramus fracture ([place]) 05/08/2012  . Osteoarthritis of right knee 08/19/2011  . Scoliosis of lumbar spine 05/21/2011  . Scoliosis of thoracic spine 05/21/2011    Past Surgical History:  Procedure Laterality Date  . ABDOMINAL HYSTERECTOMY    . COLON SURGERY    . COLONOSCOPY  05/27/2011   Procedure: COLONOSCOPY;  Surgeon: Juanita Craver, MD;  Location: [facility];  Service: Endoscopy;  Laterality: N/A;  . COLONOSCOPY WITH PROPOFOL N/A 06/30/2014   Procedure: COLONOSCOPY WITH PROPOFOL;  Surgeon: Juanita Craver, MD;  Location: [facility];  Service: Endoscopy;  Laterality: N/A;     OB History    Gravida  1   Para      Term      Preterm      AB      Living        SAB      TAB      Ectopic      Multiple      Live Births  Family History  Problem Relation Age of Onset  . Cancer Mother        COLON  . Stroke Father   . Diabetes Sister     Social History   Tobacco Use  . Smoking status: Never Smoker  . Smokeless tobacco: Never Used  Substance Use Topics  . Alcohol use: No  . Drug use: No    Home Medications Prior to Admission medications   Medication Sig Start Date End Date Taking? Authorizing Provider  amiodarone (PACERONE) 100 MG tablet Take 1 tablet (100 mg total) by mouth daily. 12/12/18  Yes Swayze, Ava, DO  DEXILANT 60 MG capsule TAKE 1 CAPSULE BY MOUTH EVERY DAY Patient taking differently: Take 60 mg by mouth daily.  03/04/2019  Yes [place] Chard, MD  diazepam (VALIUM) 2 MG tablet Take 0.5 mg by  mouth at bedtime.   Yes [provider]  fluticasone furoate-vilanterol (BREO ELLIPTA) 200-25 MCG/INH AEPB Inhale 1 puff into the lungs daily. 01/05/19  Yes Minette Brine, FNP  furosemide (LASIX) 40 MG tablet Take 0.5-1 tablets (20-40 mg total) by mouth 2 (two) times daily. Take 5m in the morning and 279mat lunch. Patient taking differently: Take 40 mg by mouth 2 (two) times daily.  11/27/18  Yes Black, KaLezlie OctaveNP  potassium chloride (KLOR-CON) 8 MEQ CR tablet Take 8 mEq by mouth daily.     Yes [provider]  sacubitril-valsartan (ENTRESTO) 49-51 MG Take 1 tablet by mouth 2 (two) times daily. 12/13/18  Yes Swayze, Ava, DO  Spacer/Aero-Holding Chambers (AEROCHAMBER PLUS) inhaler Use as instructed Patient taking differently: 1 each by Other route once.  10/08/18  Yes SaGlendale ChardMD  VENTOLIN HFA 108 (90 Base) MCG/ACT inhaler TAKE 2 PUFFS BY MOUTH EVERY 6 HOURS AS NEEDED FOR WHEEZE OR SHORTNESS OF BREATH Patient taking differently: Inhale 2 puffs into the lungs every 6 (six) hours as needed for wheezing or shortness of breath.  01/11/19  Yes SaGlendale ChardMD  Iron Polysacch Cmplx-B12-FA (NIFEREX-150 FORTE PO) Take 150 mg by mouth daily.     [provider]    Allergies    Shrimp [shellfish allergy] and Penicillins  Review of Systems   Review of Systems  Unable to perform ROS: Patient unresponsive    Physical Exam Updated Vital Signs BP (!) 136/109 (BP Location: Right Arm)   Pulse (!) 42   Temp (!) 102 F (38.9 C)   Resp (!) 7   Ht _0  (1.575 m)   Wt 69.4 kg   SpO2 (!) 73%   BMI 27.98 kg/m   Physical Exam Vitals and nursing note reviewed.  Constitutional:      Appearance: She is well-developed.     Comments: Unresponsive  HENT:     Head: Atraumatic.  Cardiovascular:     Rate and Rhythm: Bradycardia present.  Pulmonary:     Comments: Intubated Abdominal:     General: There is distension.  Musculoskeletal:     Cervical back: Normal range  of motion and neck supple.     Right lower leg: Edema present.     Left lower leg: Edema present.  Skin:    Comments: Cool extremities  Neurological:     Comments: GCS 3     ED Results / Procedures / Treatments   Labs (all labs ordered are listed, but only abnormal results are displayed) Labs Reviewed  COMPREHENSIVE METABOLIC PANEL - Abnormal; Notable for the following components:      Result  Value   CO2 17 (*)    Glucose, Bld 338 (*)    BUN 41 (*)    Creatinine, Ser 2.43 (*)    Total Protein 4.8 (*)    Albumin 2.1 (*)    AST 82 (*)    ALT 47 (*)    Total Bilirubin 1.5 (*)    GFR calc non Af Amer 16 (*)    GFR calc Af Amer 19 (*)    Anion gap 22 (*)    All other components within normal limits  LACTIC ACID, PLASMA - Abnormal; Notable for the following components:   Lactic Acid, Venous >11.0 (*)    All other components within normal limits  PROTIME-INR - Abnormal; Notable for the following components:   Prothrombin Time 23.0 (*)    INR 2.0 (*)    All other components within normal limits  LACTIC ACID, PLASMA - Abnormal; Notable for the following components:   Lactic Acid, Venous 8.4 (*)    All other components within normal limits  BRAIN NATRIURETIC PEPTIDE - Abnormal; Notable for the following components:   B Natriuretic Peptide 3,403.6 (*)    All other components within normal limits  COMPREHENSIVE METABOLIC PANEL - Abnormal; Notable for the following components:   Glucose, Bld 323 (*)    BUN 42 (*)    Creatinine, Ser 2.20 (*)    Calcium 8.0 (*)    Total Protein 4.8 (*)    Albumin 2.1 (*)    AST 121 (*)    ALT 62 (*)    Total Bilirubin 1.5 (*)    GFR calc non Af Amer 19 (*)    GFR calc Af Amer 22 (*)    Anion gap 20 (*)    All other components within normal limits  CBC - Abnormal; Notable for the following components:   WBC 10.8 (*)    RDW 20.8 (*)    All other components within normal limits  CBC - Abnormal; Notable for the following components:   WBC  12.7 (*)    RBC 5.17 (*)    RDW 20.7 (*)    All other components within normal limits  BASIC METABOLIC PANEL - Abnormal; Notable for the following components:   CO2 20 (*)    Glucose, Bld 252 (*)    BUN 43 (*)    Creatinine, Ser 2.29 (*)    Calcium 7.9 (*)    GFR calc non Af Amer 18 (*)    GFR calc Af Amer 21 (*)    Anion gap 17 (*)    All other components within normal limits  GLUCOSE, CAPILLARY - Abnormal; Notable for the following components:   Glucose-Capillary 277 (*)    All other components within normal limits  LACTIC ACID, PLASMA - Abnormal; Notable for the following components:   Lactic Acid, Venous 7.8 (*)    All other components within normal limits  GLUCOSE, CAPILLARY - Abnormal; Notable for the following components:   Glucose-Capillary 221 (*)    All other components within normal limits  GLUCOSE, CAPILLARY - Abnormal; Notable for the following components:   Glucose-Capillary 156 (*)    All other components within normal limits  GLUCOSE, CAPILLARY - Abnormal; Notable for the following components:   Glucose-Capillary 118 (*)    All other components within normal limits  GLUCOSE, CAPILLARY - Abnormal; Notable for the following components:   Glucose-Capillary 111 (*)    All other components within normal limits  I-STAT CHEM 8,  ED - Abnormal; Notable for the following components:   BUN 42 (*)    Creatinine, Ser 2.00 (*)    Glucose, Bld 320 (*)    Calcium, Ion 1.07 (*)    TCO2 16 (*)    Hemoglobin 15.3 (*)    All other components within normal limits  CBG MONITORING, ED - Abnormal; Notable for the following components:   Glucose-Capillary 44 (*)    All other components within normal limits  CBG MONITORING, ED - Abnormal; Notable for the following components:   Glucose-Capillary 42 (*)    All other components within normal limits  POCT I-STAT 7, (LYTES, BLD GAS, ICA,H+H) - Abnormal; Notable for the following components:   pH, Arterial 7.106 (*)    pCO2 arterial  51.9 (*)    pO2, Arterial 260.0 (*)    Bicarbonate 16.3 (*)    TCO2 18 (*)    Acid-base deficit 13.0 (*)    All other components within normal limits  CBG MONITORING, ED - Abnormal; Notable for the following components:   Glucose-Capillary 157 (*)    All other components within normal limits  CBG MONITORING, ED - Abnormal; Notable for the following components:   Glucose-Capillary 212 (*)    All other components within normal limits  POCT I-STAT 7, (LYTES, BLD GAS, ICA,H+H) - Abnormal; Notable for the following components:   Acid-base deficit 3.0 (*)    Calcium, Ion 1.06 (*)    Hemoglobin 15.6 (*)    All other components within normal limits  TROPONIN I (HIGH SENSITIVITY) - Abnormal; Notable for the following components:   Troponin I (High Sensitivity) 1,805 (*)    All other components within normal limits  TROPONIN I (HIGH SENSITIVITY) - Abnormal; Notable for the following components:   Troponin I (High Sensitivity) 2,265 (*)    All other components within normal limits  RESPIRATORY PANEL BY RT PCR (FLU A&B, COVID)  CULTURE, BLOOD (ROUTINE X 2)  CULTURE, BLOOD (ROUTINE X 2)  MRSA PCR SCREENING  CDS SEROLOGY  ETHANOL  MAGNESIUM  GLUCOSE, CAPILLARY  URINALYSIS, ROUTINE W REFLEX MICROSCOPIC  URINALYSIS, ROUTINE W REFLEX MICROSCOPIC  BLOOD GAS, ARTERIAL  BLOOD GAS, ARTERIAL  BLOOD GAS, ARTERIAL  I-STAT ARTERIAL BLOOD GAS, ED  SAMPLE TO BLOOD BANK    EKG EKG Interpretation  Date/Time:  Saturday February 13 2019 19:12:37 EST Ventricular Rate:  84 PR Interval:    QRS Duration: 166 QT Interval:  443 QTC Calculation: 524 R Axis:   -82 Text Interpretation: Sinus rhythm Ventricular premature complex RBBB and LAFB LVH with secondary repolarization abnormality Inferior infarct, acute (RCA) Probable RV involvement, suggest recording right precordial leads wide complex qrs is new Long QTc Nonspecific ST and T wave abnormality Confirmed by Varney Biles [phone]) on 02/14/2019 3:14:27  PM   Radiology CT HEAD WO CONTRAST  Result Date: 02/18/2019 CLINICAL DATA:  Encephalopathy EXAM: CT HEAD WITHOUT CONTRAST TECHNIQUE: Contiguous axial images were obtained from the base of the skull through the vertex without intravenous contrast. COMPARISON:  December 08, 2018 FINDINGS: Brain: No evidence of acute territorial infarction, hemorrhage, hydrocephalus,extra-axial collection or mass lesion/mass effect. There is dilatation the ventricles and sulci consistent with age-related atrophy. Low-attenuation changes in the deep white matter consistent with small vessel ischemia. Vascular: No hyperdense vessel or unexpected calcification. Skull: The skull is intact. No fracture or focal lesion identified. Sinuses/Orbits: There is fluid seen within the right sphenoid air cell and nasopharynx. The patient is intubated with a nasogastric tube. The  orbits and globes intact. Other: None IMPRESSION: No acute intracranial abnormality. Findings consistent with age related atrophy and chronic small vessel ischemia Electronically Signed   By: Prudencio Pair M.D.   On: 02/26/2019 23:10   DG Chest Portable 1 View  Result Date: 03/02/2019 CLINICAL DATA:  Central line placement EXAM: PORTABLE CHEST 1 VIEW COMPARISON:  02/16/2019 FINDINGS: Left central line is been placed. The tip is in the upper right atrium. NG tube and endotracheal tube are unchanged. Cardiomegaly with vascular congestion and possible early interstitial edema. Suspect small effusions. No pneumothorax. IMPRESSION: Left central line placement with the tip in the upper right atrium. No pneumothorax. Otherwise no change. Electronically Signed   By: Rolm Baptise M.D.   On: 02/22/2019 20:21   DG Chest Port 1 View  Result Date: 03/07/2019 CLINICAL DATA:  Post CPR and intubation EXAM: PORTABLE CHEST 1 VIEW COMPARISON:  Twelve 2 FINDINGS: Endotracheal tube is 4.8 cm above the carina. NG tube enters the stomach. Cardiomegaly, vascular congestion. Suspect small  bilateral effusions. Early interstitial perihilar prominence could reflect interstitial edema. No pneumothorax. No acute bony abnormality. IMPRESSION: Cardiomegaly with vascular congestion and possible perihilar interstitial edema. Electronically Signed   By: Rolm Baptise M.D.   On: 02/10/2019 19:49   EEG adult  Result Date: 02/14/2019 Alexis Goodell, MD     02/14/2019  1:12 PM ELECTROENCEPHALOGRAM REPORT Patient: RIANNA LUKES       Room #: 1O10R EEG No. ID: [id number] Age: 86 y.o.        Sex: female Requesting Physician: Mariane Masters Report Date:  02/14/2019       Interpreting Physician: Alexis Goodell History: Anea M Avilla is an 86 y.o. female s/p PEA arrest Medications: Levophed, ASA, Amiodarone, Conditions of Recording:  This is a 21 channel routine scalp EEG performed with bipolar and monopolar montages arranged in accordance to the international 10/20 system of electrode placement. One channel was dedicated to EKG recording. The patient is in the intubated and poorly responsive state. Description:  The background activity is discontinuous.  It consists of of bursts of polyspike, spike and slow activity alternating with periods of attenuation. The burst activity lasts from 2-4 seconds and is generalized.  The periods of attenuation last up to 40 seconds. This discontinuous activity is maintained throughout the tracing. Painful stimulation is applied and although the patient does appear to withdraw from painful stimuli clinically, there is no activation of the background rhythm noted.  Hyperventilation and intermittent photic stimulation were not performed. IMPRESSION: This is an abnormal EEG due to a burst-suppression pattern seen throughout the tracing.  Burst suppression pattern can be seen in a variety of circumstances, including anesthesia, drug intoxication, hypothermia, as well as cerebral anoxia.  Clinical/neurological, and radiographic correlation advised.  Alexis Goodell, MD Neurology  [phone] 02/14/2019, 1:02 PM   ECHOCARDIOGRAM COMPLETE  Result Date: 02/14/2019   ECHOCARDIOGRAM REPORT   Patient Name:   KALLE BERNATH Date of Exam: 02/14/2019 Medical Rec #:  [id number]          Height:       62.0 in Accession #:    [id number]         Weight:       153.0 lb Date of Birth:  [birth year]          BSA:          1.71 m Patient Age:    34 years           BP:  136/109 mmHg Patient Gender: F                  HR:           75 bpm. Exam Location:  Inpatient Procedure: 2D Echo, Cardiac Doppler and Color Doppler Indications:    Cardiac Arrest I46.9  History:        Patient has prior history of Echocardiogram examinations, most                 recent 07/29/2018. CHF and Cardiomyopathy, Arrythmias:LBBB; Risk                 Factors:Hypertension and Dyslipidemia.  Sonographer:    Jonelle Sidle Dance Referring Phys: [id number] Francesca Jewett  Sonographer Comments: Echo performed with patient supine and on artificial respirator. IMPRESSIONS  1. Left ventricular ejection fraction, by visual estimation, is 20 to 25%. The left ventricle has normal function. There is no left ventricular hypertrophy.  2. Mildly dilated left ventricular internal cavity size.  3. The left ventricle demonstrates regional wall motion abnormalities.  4. There is akinesis of the basal and mid inferoseptal and mid anteroseptal walls. There is akinesis of the apical septum. There is severe hypokinesis of the mid and apical lateral wall. There is hypokinesis of the anterior wall. There is akinesis of the mid and basal inferolateral, entire inferior and apical akinesis.  5. Left ventricular diastolic parameters are consistent with Grade I diastolic dysfunction (impaired relaxation).  6. Global right ventricle has mildly reduced systolic function.The right ventricular size is normal. No increase in right ventricular wall thickness.  7. Left atrial size was severely dilated.  8. Right atrial size was severely dilated.  9. There is moderate  thickening of the mitral valve leaflet(s). There is moderate calcification of the mitral valve leaflet(s) extending into the subvalvular apparatus. Moderate mitral annular calcification. Moderate mitral valve regurgitation. No evidence of mitral stenosis. 10. The tricuspid valve is normal in structure. Tricuspid valve regurgitation is mild-moderate. 11. The aortic valve is tricuspid. Aortic valve regurgitation is not visualized. Moderate aortic valve sclerosis/calcification without any evidence of aortic stenosis. 12. The pulmonic valve was normal in structure. Pulmonic valve regurgitation is mild. 13. Moderately elevated pulmonary artery systolic pressure. 14. The inferior vena cava is dilated in size with <50% respiratory variability, suggesting right atrial pressure of 15 mmHg. 15. The tricuspid regurgitant velocity is 2.61 m/s, and with an assumed right atrial pressure of 15 mmHg, the estimated right ventricular systolic pressure is moderately elevated at 42.3 mmHg. 16. Small to moderate pericardial effusion. 17. The pericardial effusion is circumferential. 18. There is inversion of the right atrial wall. FINDINGS  Left Ventricle: Left ventricular ejection fraction, by visual estimation, is 20 to 25%. The left ventricle has normal function. The left ventricle demonstrates regional wall motion abnormalities. The left ventricular internal cavity size was mildly dilated left ventricle. There is no left ventricular hypertrophy. Left ventricular diastolic parameters are consistent with Grade I diastolic dysfunction (impaired relaxation). Normal left atrial pressure. There is akinesis of the basal and mid inferoseptal and mid anteroseptal walls. There is akinesis of the apical septum. There is severe hypokinesis of the mid and apical lateral wall. There is hypokinesis of the anterior wall. There is akinesis of the mid and basal inferolateral, entire inferior and apical akinesis. Right Ventricle: The right ventricular  size is normal. No increase in right ventricular wall thickness. Global RV systolic function is has mildly reduced systolic function. The tricuspid regurgitant velocity  is 2.61 m/s, and with an assumed right atrial pressure of 15 mmHg, the estimated right ventricular systolic pressure is moderately elevated at 42.3 mmHg. Left Atrium: Left atrial size was severely dilated. Right Atrium: Right atrial size was severely dilated Pericardium: A small pericardial effusion is present. The pericardial effusion is circumferential. There is inversion of the right atrial wall. Mitral Valve: The mitral valve is normal in structure. There is moderate thickening of the mitral valve leaflet(s). There is moderate calcification of the mitral valve leaflet(s). Moderate mitral annular calcification. Moderate mitral valve regurgitation. No evidence of mitral valve stenosis by observation. Tricuspid Valve: The tricuspid valve is normal in structure. Tricuspid valve regurgitation is mild-moderate. Aortic Valve: The aortic valve is tricuspid. Aortic valve regurgitation is not visualized. Mild to moderate aortic valve sclerosis/calcification is present, without any evidence of aortic stenosis. Moderate aortic valve annular calcification. Pulmonic Valve: The pulmonic valve was normal in structure. Pulmonic valve regurgitation is mild. Pulmonic regurgitation is mild. Aorta: The aortic root, ascending aorta and aortic arch are all structurally normal, with no evidence of dilitation or obstruction. Venous: The inferior vena cava is dilated in size with less than 50% respiratory variability, suggesting right atrial pressure of 15 mmHg. IAS/Shunts: No atrial level shunt detected by color flow Doppler. There is no evidence of a patent foramen ovale. No ventricular septal defect is seen or detected. There is no evidence of an atrial septal defect.  LEFT VENTRICLE PLAX 2D LVIDd:         5.36 cm LVIDs:         4.17 cm LV PW:         1.29 cm LV IVS:         0.90 cm LVOT diam:     1.80 cm LV SV:         62 ml LV SV Index:   34.97 LVOT Area:     2.54 cm  RIGHT VENTRICLE            IVC RV Basal diam:  3.81 cm    IVC diam: 3.42 cm RV Mid diam:    2.73 cm RV S prime:     8.31 cm/s TAPSE (M-mode): 1.5 cm LEFT ATRIUM              Index       RIGHT ATRIUM           Index LA diam:        5.10 cm  2.99 cm/m  RA Area:     25.00 cm LA Vol (A2C):   119.0 ml 69.75 ml/m RA Volume:   84.50 ml  49.53 ml/m LA Vol (A4C):   87.1 ml  51.06 ml/m LA Biplane Vol: 103.0 ml 60.38 ml/m  AORTIC VALVE LVOT Vmax:   44.35 cm/s LVOT Vmean:  26.750 cm/s LVOT VTI:    0.052 m  AORTA Ao Root diam: 3.50 cm Ao Asc diam:  3.10 cm MITRAL VALVE                        TRICUSPID VALVE MV Area (PHT): 1.87 cm             TR Peak grad:   27.3 mmHg MV PHT:        117.45 msec          TR Vmax:        271.00 cm/s MV Decel Time: 405 msec MV E velocity: 45.60 cm/s 103  cm/s  SHUNTS MV A velocity: 66.90 cm/s 70.3 cm/s Systemic VTI:  0.05 m MV E/A ratio:  0.68       1.5       Systemic Diam: 1.80 cm  Fransico Him MD Electronically signed by Fransico Him MD Signature Date/Time: 02/14/2019/11:54:20 AM    Final    VAS [place] LOWER EXTREMITY VENOUS (DVT)  Result Date: 02/14/2019  Lower Venous DVTStudy Indications: Edema, and extensive pitting edema.  Limitations: Body habitus and patient position and ability to cooperate. Performing Technologist: Antonieta Pert RDMS, RVT  Examination Guidelines: A complete evaluation includes B-mode imaging, spectral Doppler, color Doppler, and power Doppler as needed of all accessible portions of each vessel. Bilateral testing is considered an integral part of a complete examination. Limited examinations for reoccurring indications may be performed as noted. The reflux portion of the exam is performed with the patient in reverse Trendelenburg.  +---------+---------------+---------+-----------+----------+------------------+ RIGHT     CompressibilityPhasicitySpontaneityPropertiesThrombus Aging     +---------+---------------+---------+-----------+----------+------------------+ CFV      Full           Yes      Yes                                     +---------+---------------+---------+-----------+----------+------------------+ SFJ      Full                                                            +---------+---------------+---------+-----------+----------+------------------+ FV Prox  Full                                                            +---------+---------------+---------+-----------+----------+------------------+ FV Mid   Full                                                            +---------+---------------+---------+-----------+----------+------------------+ FV DistalFull                                                            +---------+---------------+---------+-----------+----------+------------------+ PFV      Full                                                            +---------+---------------+---------+-----------+----------+------------------+ POP      None           Yes      Yes                  unable to compress +---------+---------------+---------+-----------+----------+------------------+  PTV      Partial                                      Acute              +---------+---------------+---------+-----------+----------+------------------+ PERO     Partial                                      Acute              +---------+---------------+---------+-----------+----------+------------------+ Gastroc  None                                         Acute              +---------+---------------+---------+-----------+----------+------------------+ GSV      None                               dilated   Acute              +---------+---------------+---------+-----------+----------+------------------+    +---------+---------------+---------+-----------+----------+-----------------+ LEFT     CompressibilityPhasicitySpontaneityPropertiesThrombus Aging    +---------+---------------+---------+-----------+----------+-----------------+ CFV      Full                                                           +---------+---------------+---------+-----------+----------+-----------------+ SFJ      Full                                                           +---------+---------------+---------+-----------+----------+-----------------+ FV Prox  Full                                                           +---------+---------------+---------+-----------+----------+-----------------+ FV Mid   Full                                                           +---------+---------------+---------+-----------+----------+-----------------+ FV DistalFull                                                           +---------+---------------+---------+-----------+----------+-----------------+ PFV      Full                                                           +---------+---------------+---------+-----------+----------+-----------------+  POP      Full                                                           +---------+---------------+---------+-----------+----------+-----------------+ PTV      Partial                                      Acute             +---------+---------------+---------+-----------+----------+-----------------+ PERO     Partial                                      Age Indeterminate +---------+---------------+---------+-----------+----------+-----------------+ Gastroc  Partial                                      Acute             +---------+---------------+---------+-----------+----------+-----------------+ GSV      Full                                                            +---------+---------------+---------+-----------+----------+-----------------+     Summary: RIGHT: - Findings consistent with acute deep vein thrombosis involving the right posterior tibial veins, right peroneal veins, and right gastrocnemius veins. - Findings consistent with acute superficial vein thrombosis involving the right great saphenous vein. - A cystic structure is found in the popliteal fossa.  LEFT: - Findings consistent with acute deep vein thrombosis involving the left posterior tibial veins, and left peroneal veins. - A cystic structure is found in the popliteal fossa.  *See table(s) above for measurements and observations. Electronically signed by Ruta Hinds MD on 02/14/2019 at 11:29:42 AM.    Final     Procedures .Critical Care Performed by: Varney Biles, MD Authorized by: Varney Biles, MD   Critical care provider statement:    Critical care time (minutes):  48   Critical care was necessary to treat or prevent imminent or life-threatening deterioration of the following conditions:  Circulatory failure and cardiac failure   Critical care was time spent personally by me on the following activities:  Discussions with consultants, evaluation of patient's response to treatment, examination of patient, ordering and performing treatments and interventions, ordering and review of laboratory studies, ordering and review of radiographic studies, pulse oximetry, re-evaluation of patient's condition, obtaining history from patient or surrogate and review of old charts   (including critical care time)  Medications Ordered in ED Medications  dextrose 50 % solution (  Not Given 02/18/2019 2015)  dextrose 10 % infusion ( Intravenous Rate/Dose Verify 02/14/19 0200)  sodium chloride flush (NS) 0.9 % injection 10-40 mL (10 mLs Intracatheter Given 02/14/19 2230)  sodium chloride flush (NS) 0.9 % injection 10-40 mL (has no administration in time range)  Chlorhexidine Gluconate Cloth 2 % PADS 6 each  (6 each Topical Given 02/14/19 1800)  norepinephrine (LEVOPHED) 48m in  220m premix infusion (32 mcg/min Intravenous New Bag/Given 02/14/19 2307)  fentaNYL 25090m in NS 25023m5m65ml) infusion-PREMIX (50 mcg/hr Intravenous New Bag/Given 02/14/19 1930)  fentaNYL (SUBLIMAZE) bolus via infusion 25 mcg (has no administration in time range)  aspirin EC tablet 325 mg (325 mg Oral Given 02/14/19 1039)  pantoprazole (PROTONIX) injection 40 mg (40 mg Intravenous Given 02/14/19 2312)  mupirocin ointment (BACTROBAN) 2 % 1 application (1 application Nasal Given 02/14/19 2205)  chlorhexidine gluconate (MEDLINE KIT) (PERIDEX) 0.12 % solution 15 mL (15 mLs Mouth Rinse Given 02/14/19 2012)  MEDLINE mouth rinse (15 mLs Mouth Rinse Given 02/14/19 2308)  amiodarone (PACERONE) tablet 100 mg (100 mg Per Tube Given 02/14/19 1039)  calcium chloride injection (1 g Intravenous Given 03/02/2019 1924)  sodium bicarbonate injection (50 mEq Intravenous Given 03/05/2019 1926)  dextrose 50 % solution (50 mLs Intravenous Given 02/23/2019 1929)  lactated ringers bolus 500 mL (0 mLs Intravenous Stopped 02/09/2019 2227)  fentaNYL (SUBLIMAZE) injection 25 mcg (25 mcg Intravenous Given 02/11/2019 2226)  sodium bicarbonate injection 100 mEq (100 mEq Intravenous Given 03/03/2019 2340)  sodium bicarbonate 1 mEq/mL injection (  Duplicate 2/7/5/9/9777414odium bicarbonate 1 mEq/mL injection (  Duplicate 2/7/2/3/9583202PINEPHrine (ADRENALIN) 1 MG/10ML injection (  Duplicate 2/7/3/3/4385686PINEPHrine (ADRENALIN) 1 MG/10ML injection (  Duplicate 2/7/1/6/8387290miodarone (NEXTERONE) 1.8 mg/mL load via infusion 150 mg (150 mg Intravenous Bolus from Bag 02/17/2019 2356)  amiodarone (NEXTERONE PREMIX) 360-4.14 MG/200ML-% (1.8 mg/mL) IV infusion (  Duplicate 2/7/2/1/1195520odium bicarbonate injection 50 mEq (50 mEq Intravenous Given 03/04/2019 2340)  acetaminophen (TYLENOL) 160 MG/5ML solution 650 mg (650 mg Per Tube Given 02/14/19 2023)    ED Course  I have reviewed the triage vital  signs and the nursing notes.  Pertinent labs & imaging results that were available during my care of the patient were reviewed by me and considered in my medical decision making (see chart for details).    MDM Rules/Calculators/A&P                      89 y66r old female with history of advanced CHF, CKD comes in a chief complaint of witnessed cardiac arrest from her home. Patient had received CPR per EMS with 4 rounds of epinephrine. She presents to the ER after ROSC.  Airway was secured in the ER. We did not lose any pulse in the ED and continue resuscitation. Initial thoughts were that there could be an MI and code STEMI was activated in the field. Cardiology at the bedside. It does not appear that there is a clear MI as the underlying cause. Family reports that patient has been having worsening shortness of breath and lower extremity swelling. After discussion with other family members the granddaughter, who has medical POA told us tKoreat patient is full code. Central line was placed without any complications. Patient is on pressors for resuscitation. Lactic acid over 11. Mild hyper K was noted. Initial impression is that the underlying shock is cardiogenic. Bedside ultrasound was performed and there is a moderate-sized pleural effusion.   Final Clinical Impression(s) / ED Diagnoses Final diagnoses:  PEA (Pulseless electrical activity) (HCC)[place] [facility] with [facility] Orders ED Discharge Orders    None       NanaVarney Biles 02/14/19 2321

## 2019-02-14 NOTE — Progress Notes (Signed)
EEG complete - results pending 

## 2019-02-14 NOTE — Progress Notes (Signed)
eLink Physician-Brief Progress Note Patient Name: Mariah Aguilar DOB: 01-26-1922 MRN: [id number]   Date of Service  02/14/2019  HPI/Events of Note  [id number] with systolic CHF (EF 0000000) and pAF who was BIBA after PEA arrest at home. ROSC achieved after 20 minutes and patient intubated in ED. Admitted to ICU for post-ROSC care.   eICU Interventions  # Neuro: - No sedation at this time since patient has not regained consciousness post-arrest. - EEG - Neuro consult  # Respiratory: - Continue MV - Hold off on diuresis at this time given hemodynamics are tenuous, but may be required later in hospital course given interstitial edema on CXR - Echo in AM  # Cardiac: - Amiodarone drip - Levophed drip as needed for MAP >\= 65  Full Code (confirmed)     Intervention Category Evaluation Type: New Patient Evaluation  [name] 02/14/2019, 12:51 AM

## 2019-02-14 NOTE — Progress Notes (Signed)
  Echocardiogram 2D Echocardiogram has been performed.  Mariah Aguilar 02/14/2019, 10:41 AM

## 2019-02-14 NOTE — Progress Notes (Addendum)
NAME:  Mariah Aguilar, MRN:  [id number], DOB:  10-Apr-1922, LOS: 1 ADMISSION DATE:  02/28/2019, CONSULTATION DATE:  02/17/2019 REFERRING MD:  EDP, CHIEF COMPLAINT:  Cardiac Arrest  Brief History   86 year old female with significant cardiac history including dilated cardiomyopathy with an EF of 20 to 25% and paroxysmal atrial fibrillation who had a witnessed PEA arrest at home with 20 minutes of CPR by EMS, ROSC obtained and patient was intubated in the ED.  Initial concern for STEMI, but EKG was reviewed by cardiology and code STEMI discontinued. Family wants full code and aggressive care.  PCCM consulted for admission  Consults:    Procedures:  2/6-ETT 2/6-central line placed by EDP 2/6-left radial art line  Significant Diagnostic Tests:  2/6, admit to PCCM.   Micro Data:  2/7 blood cultures negative. 2/7 MRSA negative 2/7 influenza PCR and SARS COVID 19 negative.   Antimicrobials:  2/6 SARS-Cov-2>> negative 2/6 blood cultures x2>> 2/6 urine culture>>  Interim history/subjective:  Overnight not responsive.  Still in shock. Lactic acid down trending.  Objective   Blood pressure (!) 136/109, pulse 85, temperature (!) 97.5 F (36.4 C), resp. rate (!) 30, height 5\' 2"  (1.575 m), weight 69.4 kg, SpO2 100 %.    Vent Mode: PRVC FiO2 (%):  [40 %-100 %] 40 % Set Rate:  [14 bmp-18 bmp] 14 bmp Vt Set:  [400 mL] 400 mL PEEP:  [5 cmH20] 5 cmH20 Plateau Pressure:  [17 cmH20-20 cmH20] 20 cmH20   Intake/Output Summary (Last 24 hours) at 02/14/2019 [id number] Last data filed at 02/14/2019 0600 Gross per 24 hour  Intake 1112.28 ml  Output 50 ml  Net 1062.28 ml   Filed Weights   03/04/2019 1915 02/14/19 0500  Weight: 60.8 kg 69.4 kg   Examination: General: Elderly female, intubated and normally responsive HEENT: MM pink/moist, ETT in place Neuro: Grimaces to pain, otherwise unresponsive, pupils equal and responsive, breathing over vent CV: s1s2 in and out of irregular rhythm, no  m/r/g PULM: Bilateral rhonchi GI: soft, bsx4 active  Extremities: warm/dry, pitting edema bilaterally left>>r edema  Skin: no rashes or lesions  Assessment & Plan:  PEA arrest with history of HFrEF and dilated cardiomyopathy with severe mitral regurgitation and resultant cardiogenic shock -EF in 2019 20 to 25% P: -Not a candidate for TTM secondary to initial hypothermia -Lactic acid greater than 11 initial, remains on Levophed, trend lactic and maintain MAP greater than 65 -Maintain normothermia on bair hugger - echo and dopplers pending. EEG pending.  Acute hypoxemic respiratory failure - secondary to clinical coma/encephalopathy/cardiac arrest -Maintain full vent support with SAT/SBT as tolerated -titrate Vent setting to maintain SpO2 greater than or equal to 90%. -HOB elevated 30 degrees. -Plateau pressures less than 30 cm H20.  -Follow chest x-ray, ABGprn.  -Bronchial hygiene and RT/bronchodilator protocol.  Encephalopathy -Baseline alert and oriented except for some sundowning per granddaughter P: - head act with age related atrophy. Will consider MRI brain. -Fentanyl if needed for sedation. Has not required anything.  Paroxysmal atrial fibrillation -In and out of sinus in the ED P: - amiodarone gtt, will will resume po amiodarone at home dose 100 mg  - HASBLED score is 4. Not a candidate for [facility]  Acute renal insufficiency -Creatinine greater than 2 with baseline of around 1, likely related to cardiac arrest and shock P: -Monitor urine output and creatinine, avoid nephrotoxins and follow electrolytes - not a candidate for dialysis.   Hypoglycemia - was requiring D10 drip.  Will discontinue now that she has improved BG  Best practice:  Diet: N.p.o. Pain/Anxiety/Delirium protocol (if indicated): Fentanyl if needed.  VAP protocol (if indicated): Yes DVT prophylaxis: SCDs GI prophylaxis: Protonix Glucose control: was hypoglycemic on D10.improved now.  Mobility:  Bedrest Code Status: DNR. See below Family Communication: Patient's  Grandaughter Mariah Aguilar updated 2/7 early morning. Will update at bedside Disposition: ICU  ADDENDUM: I discussed the plan of care by the patient with the patient's granddaughter Mariah Aguilar at the bedside this afternoon.  [name] had previously signed a MOST order saying that she was DNR, which the daughter has brought in.  Prior to her hospital stay last month [name] was actually living independently at home.  Unfortunately since her last hospital stay she has had significant functional decline and was requiring frequent checking in and home nursing.  It now appears that she has had a deep vein thrombosis in her left leg which precipitated a likely pulmonary embolism and also cardiac arrest.  She is not a candidate for anticoagulation given her high risk for bleeding, and her EEG is concerning for abnormal brainwave activity.  Given all of this in the setting of her functional decline and her heart failure with paroxysmal atrial fibrillation unable to be anticoagulated, we discussed that [name] unfortunately has succumbed to multiple medical issues in the setting of advanced age.  Mariah Aguilar is going to talk with her family, with plans to consult hospice and focus on comfort measures only.  Expect this will likely happen Monday.    Labs   CBC: Recent Labs  Lab 02/24/2019 1952 02/17/2019 2019 02/14/19 0021 02/14/19 0302 02/14/19 0315  WBC  --   --  10.8*  --  12.7*  HGB 14.6 15.3* 13.2 15.6* 13.9  HCT 43.0 45.0 42.1 46.0 43.9  MCV  --   --  85.6  --  84.9  PLT  --   --  249  --  [id number]    Basic Metabolic Panel: Recent Labs  Lab 02/14/2019 2007 02/10/2019 2019 02/14/19 0021 02/14/19 0302 02/14/19 0315  NA 140 137 144 139 140  K 5.1 4.9 4.2 4.1 4.3  CL 101 105 101  --  103  CO2 17*  --  23  --  20*  GLUCOSE 338* 320* 323*  --  252*  BUN 41* 42* 42*  --  43*  CREATININE 2.43* 2.00* 2.20*  --  2.29*  CALCIUM 9.3  --   8.0*  --  7.9*  MG  --   --  2.0  --   --    GFR: Estimated Creatinine Clearance: 13.7 mL/min (A) (by C-G formula based on SCr of 2.29 mg/dL (H)). Recent Labs  Lab 02/10/2019 2007 02/14/19 0019 02/14/19 0021 02/14/19 0315  WBC  --   --  10.8* 12.7*  LATICACIDVEN >11.0* 8.4*  --  7.8*    Liver Function Tests: Recent Labs  Lab 02/09/2019 2007 02/14/19 0021  AST 82* 121*  ALT 47* 62*  ALKPHOS 58 63  BILITOT 1.5* 1.5*  PROT 4.8* 4.8*  ALBUMIN 2.1* 2.1*   No results for input(s): LIPASE, AMYLASE in the last 168 hours. No results for input(s): AMMONIA in the last 168 hours.  ABG    Component Value Date/Time   PHART 7.404 02/14/2019 0302   PCO2ART 33.0 02/14/2019 0302   PO2ART 106.0 02/14/2019 0302   HCO3 21.0 02/14/2019 0302   TCO2 22 02/14/2019 0302   ACIDBASEDEF 3.0 (H) 02/14/2019 0302  O2SAT 98.0 02/14/2019 0302     Coagulation Profile: Recent Labs  Lab 03/07/2019 2007  INR 2.0*    Cardiac Enzymes: No results for input(s): CKTOTAL, CKMB, CKMBINDEX, TROPONINI in the last 168 hours.  HbA1C: Hgb A1c MFr Bld  Date/Time Value Ref Range Status  10/08/2018 03:26 PM 6.1 (H) 4.8 - 5.6 % Final    Comment:             Prediabetes: 5.7 - 6.4          Diabetes: >6.4          Glycemic control for adults with diabetes: <7.0   05/19/2018 12:45 PM 6.5 (H) 4.8 - 5.6 % Final    Comment:             Prediabetes: 5.7 - 6.4          Diabetes: >6.4          Glycemic control for adults with diabetes: <7.0     CBG: Recent Labs  Lab 02/16/2019 2034 03/07/2019 2149 02/14/19 0012 02/14/19 0323 02/14/19 0751  GLUCAP 157* 212* 277* 221* 156*    Critical care time:   The patient is critically ill with multiple organ systems failure and requires high complexity decision making for assessment and support, frequent evaluation and titration of therapies, application of advanced monitoring technologies and extensive interpretation of multiple databases.   Critical Care Time devoted  to patient care services described in this note is 61 minutes. This time reflects the time of my personal involvement. This critical care time does not reflect separately billable procedures or procedure time, teaching time or supervisory time of PA/NP/Med student/Med Resident etc but could involve care discussion time.  [facility] 02/14/2019 8:52 AM  Pager: [phone] After hours pager: [phone]

## 2019-02-14 NOTE — Progress Notes (Signed)
eLink Physician-Brief Progress Note Patient Name: Mariah Aguilar DOB: 30-Jan-1922 MRN: [id number]   Date of Service  02/14/2019  HPI/Events of Note  Fever to 102 F. Mildly elevated AST/ALT.   eICU Interventions  Will order 1x dose of acetaminophen for treatment of fever.     Intervention Category Minor Interventions: Other:  Charlott Rakes 02/14/2019, 8:14 PM

## 2019-02-14 NOTE — Progress Notes (Signed)
CRITICAL VALUE ALERT  Critical Value:  Troponin I 1,805  Date & Time Notied:  2/7 0150  Provider Notified: Elink   Orders Received/Actions taken: No new orders at this time   Gabriel Rainwater, RN

## 2019-02-15 DIAGNOSIS — Z515 Encounter for palliative care: Secondary | ICD-10-CM

## 2019-02-15 LAB — POCT I-STAT 7, (LYTES, BLD GAS, ICA,H+H)
Acid-Base Excess: >30 mmol/L — ABNORMAL HIGH (ref 0.0–2.0)
Bicarbonate: 61.8 mmol/L — ABNORMAL HIGH (ref 20.0–28.0)
Calcium, Ion: 0.84 mmol/L — CL (ref 1.15–1.40)
HCT: 38 % (ref 36.0–46.0)
Hemoglobin: 12.9 g/dL (ref 12.0–15.0)
O2 Saturation: 98 %
Patient temperature: 95.5
Potassium: 3.6 mmol/L (ref 3.5–5.1)
Sodium: 164 mmol/L (ref 135–145)
TCO2: >50 mmol/L — ABNORMAL HIGH (ref 22–32)
pCO2 arterial: 59.9 mmHg — ABNORMAL HIGH (ref 32.0–48.0)
pH, Arterial: 7.616 (ref 7.350–7.450)
pO2, Arterial: 82 mmHg — ABNORMAL LOW (ref 83.0–108.0)

## 2019-02-15 LAB — GLUCOSE, CAPILLARY
Glucose-Capillary: 108 mg/dL — ABNORMAL HIGH (ref 70–99)
Glucose-Capillary: 11 mg/dL — CL (ref 70–99)
Glucose-Capillary: 144 mg/dL — ABNORMAL HIGH (ref 70–99)
Glucose-Capillary: 203 mg/dL — ABNORMAL HIGH (ref 70–99)
Glucose-Capillary: 91 mg/dL (ref 70–99)

## 2019-02-15 LAB — LACTIC ACID, PLASMA: Lactic Acid, Venous: >11 mmol/L (ref 0.5–1.9)

## 2019-02-15 MED ORDER — POLYVINYL ALCOHOL 1.4 % OP SOLN
1.0000 [drp] | Freq: Four times a day (QID) | OPHTHALMIC | Status: DC | PRN
  Filled 2019-02-15: qty 15

## 2019-02-15 MED ORDER — ACETAMINOPHEN 650 MG RE SUPP: 650.0000 mg | Freq: Four times a day (QID) | RECTAL | Status: DC | PRN

## 2019-02-15 MED ORDER — FENTANYL 2500MCG IN NS 250ML (10MCG/ML) PREMIX INFUSION: 0.0000 ug/h | INTRAVENOUS | Status: DC

## 2019-02-15 MED ORDER — DEXTROSE 5 % IV SOLN: INTRAVENOUS | Status: DC

## 2019-02-15 MED ORDER — FENTANYL BOLUS VIA INFUSION
100.0000 ug | INTRAVENOUS | Status: DC | PRN
  Filled 2019-02-15: qty 100

## 2019-02-15 MED ORDER — GLYCOPYRROLATE 0.2 MG/ML IJ SOLN: 0.2000 mg | INTRAMUSCULAR | Status: DC | PRN

## 2019-02-15 MED ORDER — GLYCOPYRROLATE 1 MG PO TABS: 1.0000 mg | ORAL_TABLET | ORAL | Status: DC | PRN

## 2019-02-15 MED ORDER — DEXTROSE 10 % IV SOLN: INTRAVENOUS | Status: DC

## 2019-02-15 MED ORDER — DIPHENHYDRAMINE HCL 50 MG/ML IJ SOLN: 25.0000 mg | INTRAMUSCULAR | Status: DC | PRN

## 2019-02-15 MED ORDER — DEXTROSE 50 % IV SOLN
INTRAVENOUS | Status: AC
  Administered 2019-02-15: 50 mL
  Filled 2019-02-15: qty 50

## 2019-02-15 MED ORDER — NOREPINEPHRINE 16 MG/250ML-% IV SOLN
0.0000 ug/min | INTRAVENOUS | Status: DC
  Administered 2019-02-15: 02:00:00 2 ug/min via INTRAVENOUS
  Filled 2019-02-15 (×2): qty 250

## 2019-02-15 MED ORDER — FENTANYL CITRATE (PF) 100 MCG/2ML IJ SOLN: 50.0000 ug | INTRAMUSCULAR | Status: DC | PRN

## 2019-02-15 MED ORDER — ACETAMINOPHEN 325 MG PO TABS: 650.0000 mg | ORAL_TABLET | Freq: Four times a day (QID) | ORAL | Status: DC | PRN

## 2019-02-15 MED ORDER — LORAZEPAM 2 MG/ML IJ SOLN: 2.0000 mg | INTRAMUSCULAR | Status: DC | PRN

## 2019-02-18 LAB — CULTURE, BLOOD (ROUTINE X 2)
Culture: NO GROWTH
Special Requests: ADEQUATE

## 2019-02-19 LAB — CULTURE, BLOOD (ROUTINE X 2): Culture: NO GROWTH

## 2019-02-26 ENCOUNTER — Other Ambulatory Visit: Payer: Self-pay | Admitting: Internal Medicine

## 2019-03-08 NOTE — Progress Notes (Signed)
Family at bedside - two granddaughters and a son-in-law.  Updated on plan of care.  Reviewed patients hospitalization and events leading up to the admission.  Family in agreement for transition to comfort measures. Support offered to family.    Noe Gens, MSN, NP-C [facility] 03-10-19, 10:39 AM   Please see [email or web address] for pager details.

## 2019-03-08 NOTE — Progress Notes (Signed)
Pt extubated using withdrawal guidelines.  RN @ bedside. 

## 2019-03-08 NOTE — Progress Notes (Signed)
PCCM interval progress note:  Called to the bedside to discuss plan of care with three of patient's family members.  She had an episode of worsening bradycardia and hypotension earlier in the shift, but has now stabilized on 49mcg Levophed.    Family would like to transition to full comfort care and extubate, but would very much like for one more family member coming from out of town to be present at that time.  That family member will arrive in the morning.  Decision made to continue current level of care overnight until all family present.  If pt becomes increasingly unstable, they would not want escalating pressors, but would want to be notified overnight.    Otilio Carpen Ilena Dieckman, PA-C

## 2019-03-08 NOTE — Progress Notes (Addendum)
NAME:  Mariah Aguilar, MRN:  [id number], DOB:  [birth year], LOS: 2 ADMISSION DATE:  02/17/2019, CONSULTATION DATE:  03/06/2019 REFERRING MD:  EDP, CHIEF COMPLAINT:  Cardiac Arrest  Brief History   86 year old female with history including dilated cardiomyopathy with an EF of 20 to 25% and paroxysmal atrial fibrillation who had a witnessed PEA arrest at home with 20 minutes of CPR by EMS, ROSC obtained and patient was intubated in the ED.  Initial concern for STEMI, but EKG was reviewed by cardiology and code STEMI discontinued. Family wants full code and aggressive care.  PCCM consulted for admission.  Pt admitted to ICU.  Found to have acute bilateral DVT, AF, EEG consistent with burst-suppression pattern.    Consults:  Cardiology  PCCM   Procedures:  ETT 2/6 >>  L IJ TLC 2/6 >>  L Rad Aline 2/6 >>  Significant Diagnostic Tests:  CT Head 2/6 >> no acute intracranial abnormality, age related atrophy LE Venous Duplex 2/7 >> bilateral acute DVT  EEG 2/7 >> abnormal EEG due to burst-suppression pattern   Significant Events:  2/06 Admit to [facility] 2/08 Family requests transition to comfort measures  Micro Data:  BCx2 2/7 >>   MRSA PCR 2/7 >> negative  Influenza A/B, COVID PCR 2/7 >> negative  Antimicrobials:     Interim history/subjective:  Remains in shock on max dose levophed.   RN reports family planning to visit at 0930 for planned comfort measures  Afebrile  Objective   Blood pressure (!) 74/55, pulse 76, temperature 98.6 F (37 C), resp. rate 15, height 5\' 2"  (1.575 m), weight 70.1 kg, SpO2 (!) 71 %.    Vent Mode: PRVC FiO2 (%):  [40 %] 40 % Set Rate:  [14 bmp] 14 bmp Vt Set:  [400 mL] 400 mL PEEP:  [5 cmH20] 5 cmH20 Plateau Pressure:  [7 cmH20-19 cmH20] 7 cmH20   Intake/Output Summary (Last 24 hours) at March 15, 2019 0910 Last data filed at 03/15/2019 0800 Gross per 24 hour  Intake 1691.1 ml  Output 50 ml  Net 1641.1 ml   Filed Weights   02/17/2019 1915 02/14/19 0500  15-Mar-2019 0330  Weight: 60.8 kg 69.4 kg 70.1 kg   Examination: General: critically ill appearing elderly female lying in bed on vent in NAD   HEENT: MM pink/moist, ETT, eyes closed Neuro: sedate on fentanyl gtt, unresponsive to verbal stimulation CV: s1s2 RRR, no m/r/g PULM:  Non-labored, lungs bilaterally coarse with crackles  GI: soft, bsx4 active  Extremities: cool/dry, generalized edema, 2+pitting in BLE, discoloration of hands, feet Skin: no rashes or lesions  Assessment & Plan:   PEA Arrest with Cardiogenic Shock Hx HFrEF & Dilated Cardiomyopathy with severe Mitral Regurgitation   EF in 2019 20 to 25% -continue levophed / current support until family arrival for comfort measures -no further aggressive care -DNR in the event of arrest  -continue fentanyl gtt for withdrawal -add PRN ativan for sedation/anxiety  -PRN robinul for increased secretions -plan to wean / stop levophed once vent removed  Acute Hypoxemic Respiratory Failure Secondary to clinical coma/encephalopathy/cardiac arrest, suspected PE with +LE doppler for DVT. -PRVC 8cc/kg for now  -plan for palliative extubation on family arrival  -O2 if needed for comfort   Acute Metabolic / Hypoxic Encephalopathy Baseline alert and oriented except for some sundowning per granddaughter. EEG abnormal with burst suppression pattern.  -no further aggressive interventions   Paroxysmal Atrial Fibrillation Acute RLE, LLE DVT In and out of sinus in the  ED -tele monitoring for now  -not a candidate for anticoagulation due to bleeding risk & planned comfort care measures -pending transition to comfort care  Acute Kidney Injury  Creatinine greater than 2 with baseline of around 1, secondary to cardiac arrest and shock -no further labs   Hypoglycemia -continue D10 infusion for now   Best practice:  Diet: NPO Pain/Anxiety/Delirium protocol (if indicated): Fentanyl  VAP protocol (if indicated): Yes DVT prophylaxis:  SCDs GI prophylaxis: Protonix Glucose control: on D10  Mobility: Bedrest Code Status: DNR.   Family Communication: Patients family updated early am by PCCM.  Will update on arrival for planned comfort measures.  Disposition: ICU  Labs   CBC: Recent Labs  Lab 03/04/2019 1952 03/03/2019 2019 02/14/19 0021 02/14/19 0302 02/14/19 0315  WBC  --   --  10.8*  --  12.7*  HGB 14.6 15.3* 13.2 15.6* 13.9  HCT 43.0 45.0 42.1 46.0 43.9  MCV  --   --  85.6  --  84.9  PLT  --   --  249  --  123456    Basic Metabolic Panel: Recent Labs  Lab 02/20/2019 2007 03/04/2019 2019 02/14/19 0021 02/14/19 0302 02/14/19 0315  NA 140 137 144 139 140  K 5.1 4.9 4.2 4.1 4.3  CL 101 105 101  --  103  CO2 17*  --  23  --  20*  GLUCOSE 338* 320* 323*  --  252*  BUN 41* 42* 42*  --  43*  CREATININE 2.43* 2.00* 2.20*  --  2.29*  CALCIUM 9.3  --  8.0*  --  7.9*  MG  --   --  2.0  --   --    GFR: Estimated Creatinine Clearance: 13.8 mL/min (A) (by C-G formula based on SCr of 2.29 mg/dL (H)). Recent Labs  Lab 02/12/2019 2007 02/14/19 0019 02/14/19 0021 02/14/19 0315 02/14/19 2323  WBC  --   --  10.8* 12.7*  --   LATICACIDVEN >11.0* 8.4*  --  7.8* >11.0*    Liver Function Tests: Recent Labs  Lab 02/24/2019 2007 02/14/19 0021  AST 82* 121*  ALT 47* 62*  ALKPHOS 58 63  BILITOT 1.5* 1.5*  PROT 4.8* 4.8*  ALBUMIN 2.1* 2.1*   No results for input(s): LIPASE, AMYLASE in the last 168 hours. No results for input(s): AMMONIA in the last 168 hours.  ABG    Component Value Date/Time   PHART 7.404 02/14/2019 0302   PCO2ART 33.0 02/14/2019 0302   PO2ART 106.0 02/14/2019 0302   HCO3 21.0 02/14/2019 0302   TCO2 22 02/14/2019 0302   ACIDBASEDEF 3.0 (H) 02/14/2019 0302   O2SAT 98.0 02/14/2019 0302     Coagulation Profile: Recent Labs  Lab 02/12/2019 2007  INR 2.0*    Cardiac Enzymes: No results for input(s): CKTOTAL, CKMB, CKMBINDEX, TROPONINI in the last 168 hours.  HbA1C: Hgb A1c MFr Bld   Date/Time Value Ref Range Status  10/08/2018 03:26 PM 6.1 (H) 4.8 - 5.6 % Final    Comment:             Prediabetes: 5.7 - 6.4          Diabetes: >6.4          Glycemic control for adults with diabetes: <7.0   05/19/2018 12:45 PM 6.5 (H) 4.8 - 5.6 % Final    Comment:             Prediabetes: 5.7 - 6.4  Diabetes: >6.4          Glycemic control for adults with diabetes: <7.0     CBG: Recent Labs  Lab 02/14/19 2015 February 24, 2019 0020 02/24/2019 0055 02-24-2019 0411 02-24-19 0752  GLUCAP 78 11* 144* 91 108*    Critical care time: n/a   Noe Gens, MSN, NP-C [facility] 02/24/2019, 9:26 AM   Please see [email or web address] for pager details.

## 2019-03-08 NOTE — Death Summary Note (Addendum)
Shelena KIDA SABIC was a 86 y.o. female admitted on 03/04/2019 with PEA cardiac arrest with 20 minutes before ROSC.  Found to have acute DVT with presumed acute PE.  EEG showed burst suppression pattern.  She was started on pressors.  She required intubation.  Found to have b/l leg DVT and presumed to have PE.  Too unstable for additional testing.  Family decided for DNR status and transition to comfort measures.  She was extubated on Mar 15, 2019 with time of death 11:09 AM.  Final diagnoses: Acute pulmonary embolism with acute cor pulmonale Acute bilateral lower leg DVTs (the right posterior tibial veins, right peroneal veins, and right gastrocnemius; left posterior tibial veins) PEA cardiac arrest Cardiogenic shock Elevated troponin from demand ischemia. Severe mitral regurgitation Acute hypoxic, hypercapnic respiratory failure Acute metabolic encephalopathy from anoxia Acute on chronic systolic CHF with dilated cardiomyopathy Paroxysmal atrial fibrillation, present on admission AKI from ATN in setting of shock and hypoxia Hypoglycemia Hx of cecal cancer Hx of LBBB Lactic acidosis with anion gap metabolic acidosis  Chesley Mires, MD [place] 2019-03-15, 11:37 AM

## 2019-03-08 NOTE — Progress Notes (Signed)
Family at bedside. E-link and ground team notified.

## 2019-03-08 NOTE — Progress Notes (Signed)
Hypoglycemic Event  CBG: 11  Treatment: amp of D50  Symptoms: none  Follow-up CBG: Time: 12:56 CBG Result:144  Possible Reasons for Event: NPO  Comments/MD notified: E-link     Antonietta Breach, RN

## 2019-03-08 NOTE — Progress Notes (Signed)
Family called RN back and they stated that they are on their way to be present at the bedside. Will notify CCM to talk to family about further care as soon as they arrive.

## 2019-03-08 NOTE — Progress Notes (Signed)
Pt transitoned to comfort care, family at bedside. Pt asystole on heart monitor, lungs and heart ascultated and pt pronounced by myself Deboraha Sprang, RN and Latricia Heft RN. At 1109. Dr. Halford Chessman notified.

## 2019-03-08 DEATH — deceased

## 2019-11-24 ENCOUNTER — Ambulatory Visit: Payer: Medicare Other

## 2019-11-24 ENCOUNTER — Ambulatory Visit: Payer: Medicare Other | Admitting: Internal Medicine

## 2021-03-04 IMAGING — RF DG UGI W SINGLE CM
10 of 19 series · 12 of 24 positions shown · IV contrast (omnipaque)
Comparison: CT 12/08/2018 and 03/05/2018.

CLINICAL DATA: Abdominal pain. Question of extraluminal air
superior to the distal stomach on prior CT. Question perforated
ulcer.

EXAM:
UPPER GI SERIES WITH KUB
TECHNIQUE: After obtaining a scout radiograph a routine upper GI series was
performed using water-soluble contrast (150 cc Omnipaque 300).
FLUOROSCOPY TIME:  Fluoroscopy Time: 1 minutes and 0 seconds of
low-dose pulsed fluoroscopy.
Radiation Exposure Index (if provided by the fluoroscopic device):
18.9 mGy
Number of Acquired Spot Images: 1 scout image. 3 spot images.

[Series 2: cp_standard · 0.28mm/px · 1 of 1 slices shown (1 of 10)]
[im 1/1]
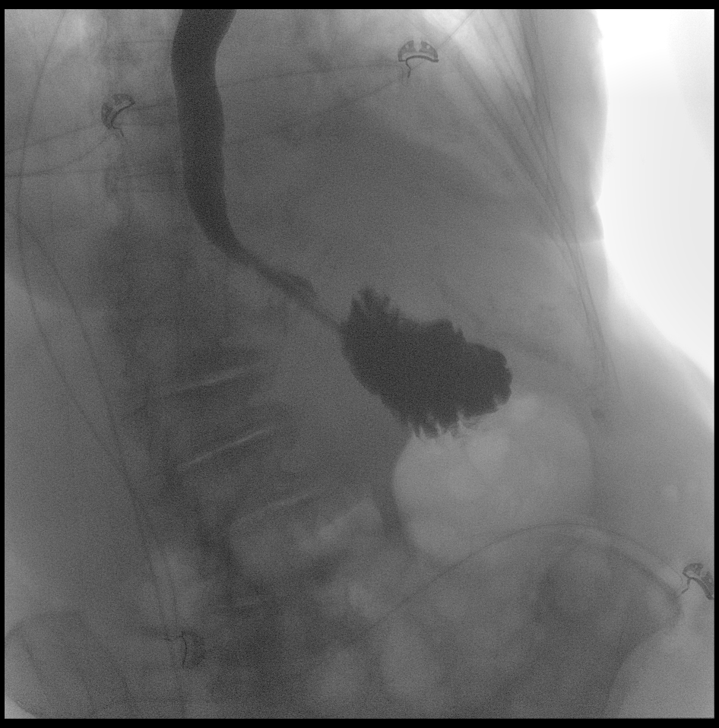

[Series 4: cp_standard · 0.28mm/px · 1 of 1 slices shown (2 of 10)]
[im 1/1]
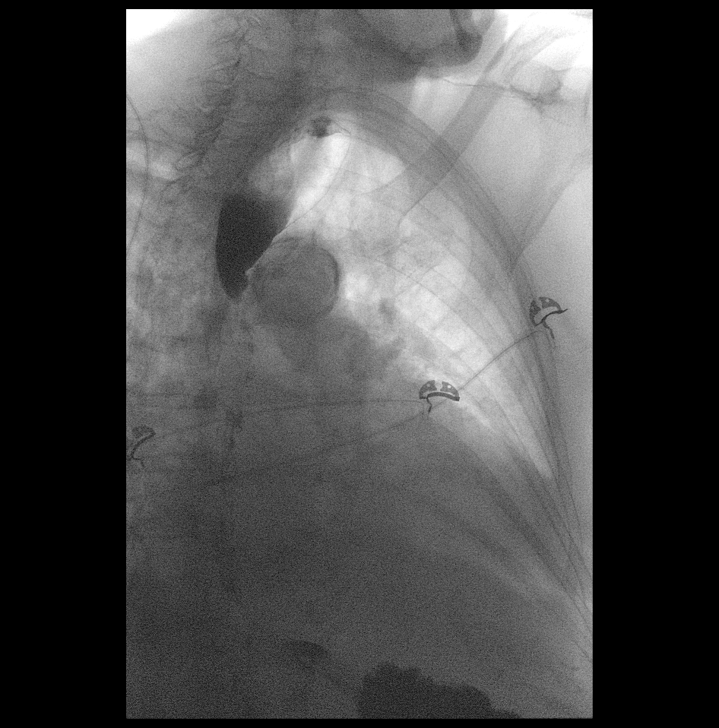

[Series 6: cp_standard · 0.55mm/px · 2 of 27 frames shown (3 of 10)]
[frame 1/27]
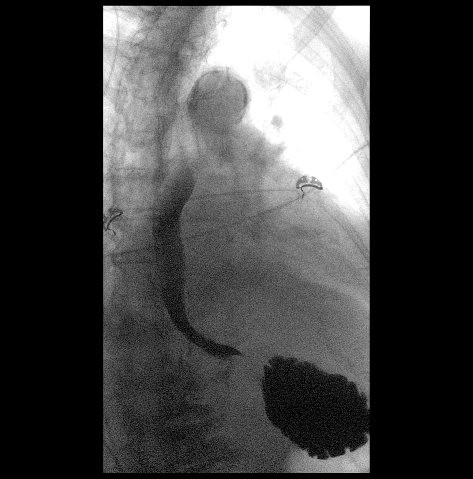
[frame 23/27]
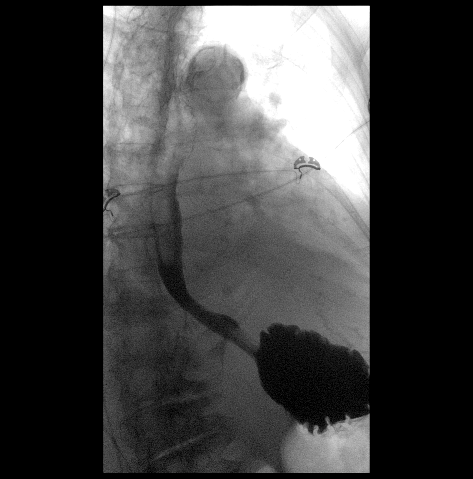

[Series 8: cp_standard · 0.28mm/px · 1 of 1 slices shown (4 of 10)]
[im 1/1]
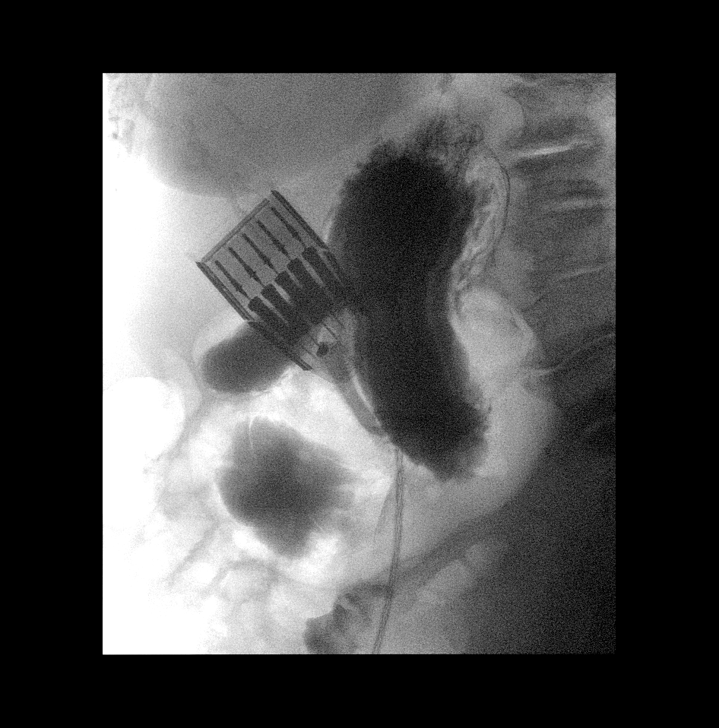

[Series 10: cp_standard · 0.30mm/px · 1 of 1 slices shown (5 of 10)]
[im 1/1]
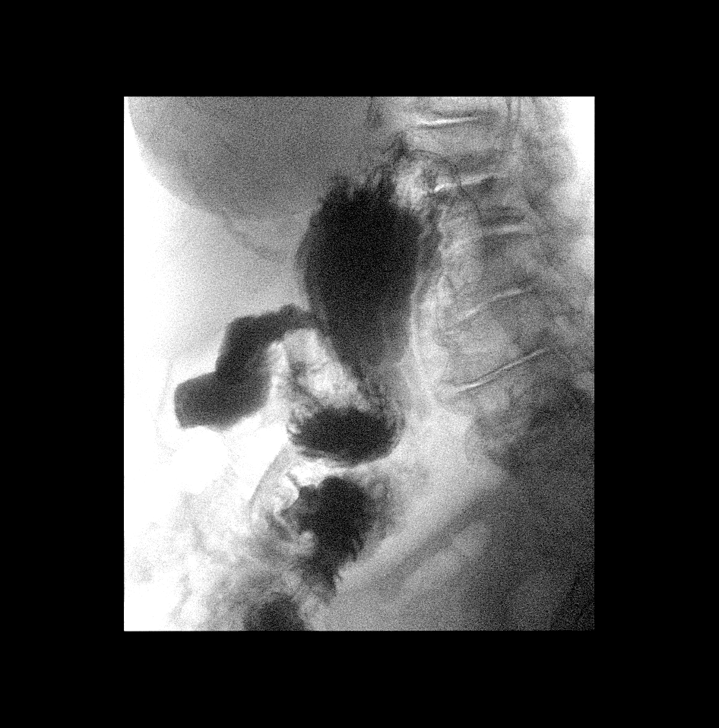

[Series 12: cp_standard · 0.29mm/px · 1 of 1 slices shown (6 of 10)]
[im 1/1]
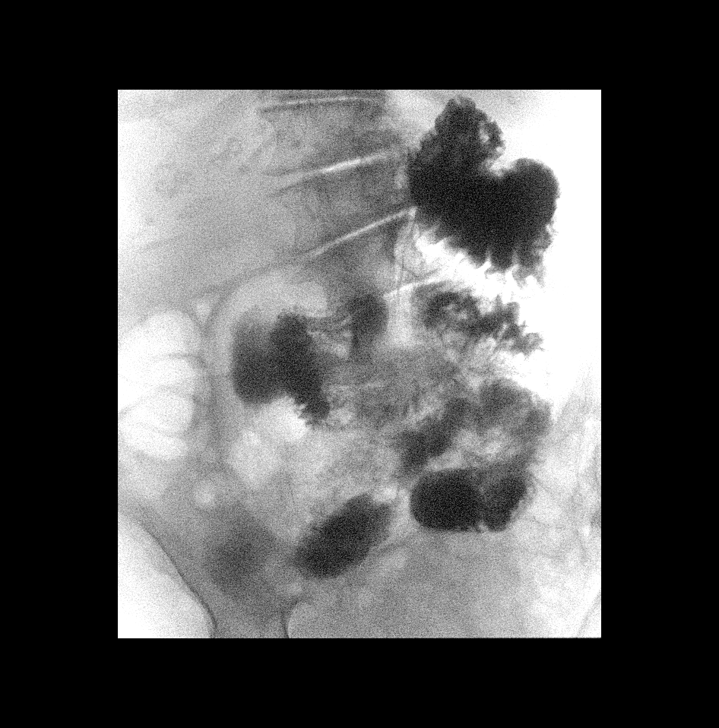

[Series 13: cp_standard · 0.59mm/px · 2 of 51 frames shown (7 of 10)]
[frame 17/51]
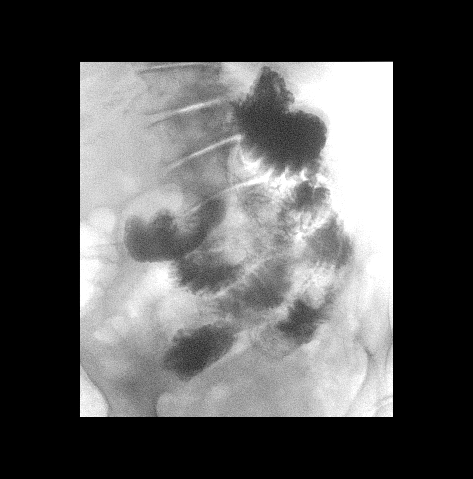
[frame 44/51]
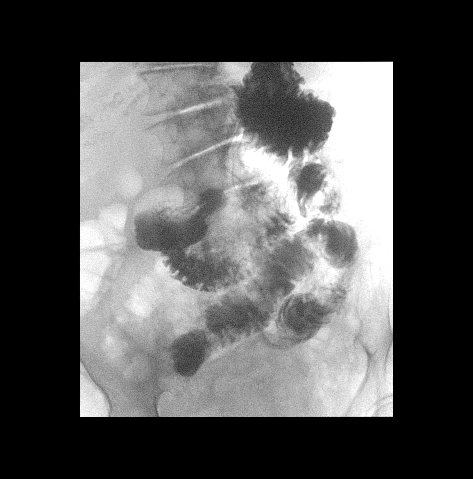

[Series 16: cp_standard · 0.18mm/px · 1 of 1 slices shown (8 of 10)]
[im 1/1]
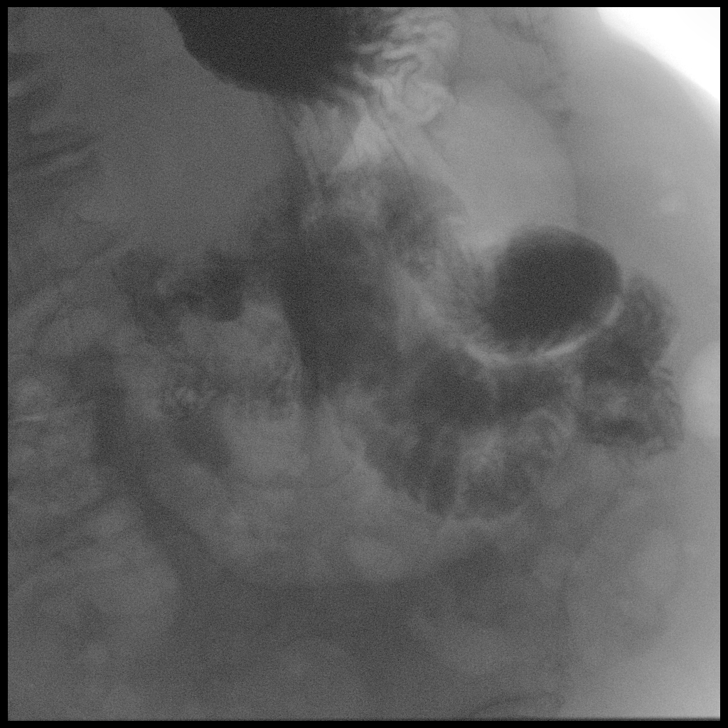

[Series 18: cp_standard · 0.19mm/px · 1 of 1 slices shown (9 of 10)]
[im 1/1]
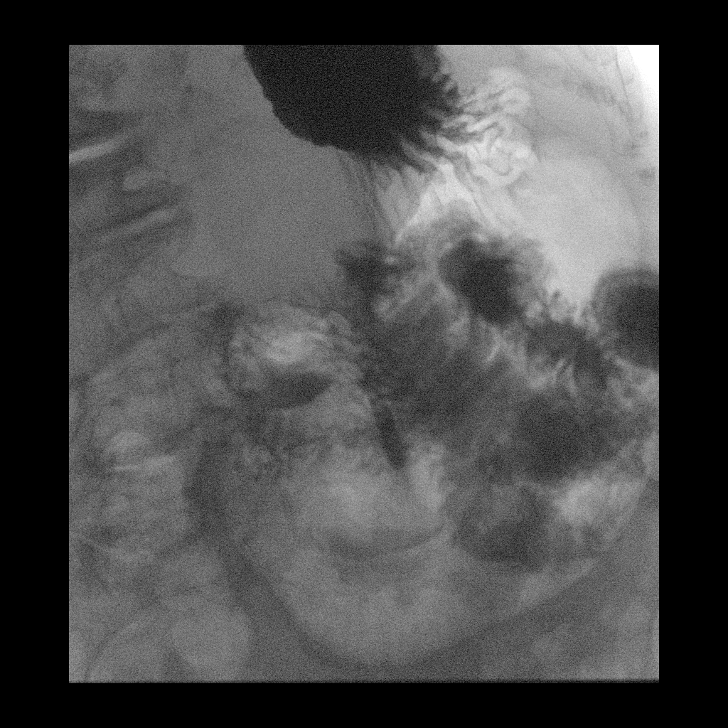

[Series 20: cp_standard · 0.19mm/px · 1 of 1 slices shown (10 of 10)]
[im 1/1]
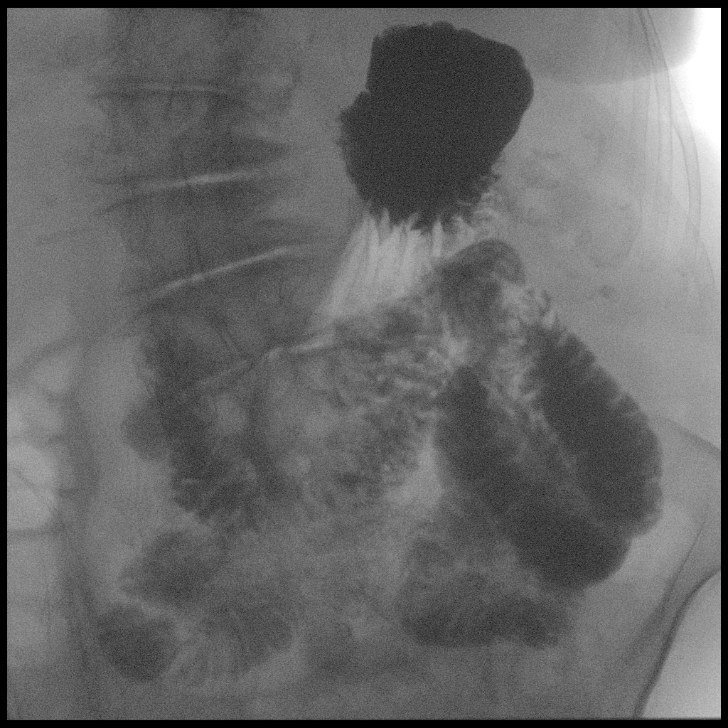

[12 of 24 positions shown; findings below may reference images not displayed]

FINDINGS: The scout image demonstrates mild gastric distention. The bowel gas
pattern is nonobstructive. There is no evidence of free
intraperitoneal air. Postsurgical changes are present in the right
abdomen related to previous right hemicolectomy. There are
degenerative changes throughout the lumbar spine associated with a
convex right scoliosis.

The study is mildly limited by the patient's limited mobility.
Examination was performed in the supine, semi erect and right
lateral decubitus positions. The patient swallowed the contrast
without difficulty. The esophageal motility appears normal. There is
no evidence of esophageal stricture, mass or ulceration.

The stomach and duodenum appear normal without evidence of
ulceration. No evidence of contrast leak.
IMPRESSION: 1. No evidence of peptic ulcer disease or perforation. No contrast
leak.
2. Esophageal motility within normal limits.
